# Patient Record
Sex: Female | Born: 1935 | Race: White | Hispanic: No | Marital: Single
Health system: Southern US, Community
[De-identification: ages and names within clinical notes are randomized; demographics above are authoritative.]

## PROBLEM LIST (undated history)

## (undated) DIAGNOSIS — I495 Sick sinus syndrome: Secondary | ICD-10-CM

## (undated) DIAGNOSIS — Z8719 Personal history of other diseases of the digestive system: Secondary | ICD-10-CM

## (undated) DIAGNOSIS — K219 Gastro-esophageal reflux disease without esophagitis: Secondary | ICD-10-CM

## (undated) DIAGNOSIS — I4891 Unspecified atrial fibrillation: Secondary | ICD-10-CM

## (undated) DIAGNOSIS — I214 Non-ST elevation (NSTEMI) myocardial infarction: Secondary | ICD-10-CM

## (undated) DIAGNOSIS — I639 Cerebral infarction, unspecified: Secondary | ICD-10-CM

## (undated) DIAGNOSIS — S065X9A Traumatic subdural hemorrhage with loss of consciousness of unspecified duration, initial encounter: Secondary | ICD-10-CM

## (undated) DIAGNOSIS — I509 Heart failure, unspecified: Secondary | ICD-10-CM

## (undated) DIAGNOSIS — E039 Hypothyroidism, unspecified: Secondary | ICD-10-CM

## (undated) DIAGNOSIS — I480 Paroxysmal atrial fibrillation: Secondary | ICD-10-CM

## (undated) DIAGNOSIS — K922 Gastrointestinal hemorrhage, unspecified: Secondary | ICD-10-CM

## (undated) DIAGNOSIS — I10 Essential (primary) hypertension: Secondary | ICD-10-CM

## (undated) DIAGNOSIS — I2510 Atherosclerotic heart disease of native coronary artery without angina pectoris: Secondary | ICD-10-CM

## (undated) DIAGNOSIS — F0390 Unspecified dementia without behavioral disturbance: Secondary | ICD-10-CM

## (undated) DIAGNOSIS — S065XAA Traumatic subdural hemorrhage with loss of consciousness status unknown, initial encounter: Secondary | ICD-10-CM

## (undated) HISTORY — DX: Non-ST elevation (NSTEMI) myocardial infarction: I21.4

## (undated) HISTORY — DX: Paroxysmal atrial fibrillation: I48.0

## (undated) HISTORY — DX: Personal history of other diseases of the digestive system: Z87.19

## (undated) HISTORY — DX: Hypothyroidism, unspecified: E03.9

## (undated) HISTORY — DX: Traumatic subdural hemorrhage with loss of consciousness of unspecified duration, initial encounter: S06.5X9A

## (undated) HISTORY — DX: Sick sinus syndrome: I49.5

## (undated) HISTORY — DX: Gastro-esophageal reflux disease without esophagitis: K21.9

## (undated) HISTORY — PX: FRACTURE SURGERY: SHX138

## (undated) HISTORY — DX: Traumatic subdural hemorrhage with loss of consciousness status unknown, initial encounter: S06.5XAA

## (undated) HISTORY — PX: INSERT / REPLACE / REMOVE PACEMAKER: SUR710

## (undated) HISTORY — PX: ABDOMINAL HYSTERECTOMY: SHX81

## (undated) HISTORY — PX: JOINT REPLACEMENT: SHX530

---

## 2000-01-18 ENCOUNTER — Encounter: Admission: RE | Admit: 2000-01-18 | Discharge: 2000-01-18 | Payer: Self-pay | Admitting: Orthopedic Surgery

## 2000-01-18 ENCOUNTER — Encounter: Payer: Self-pay | Admitting: Orthopedic Surgery

## 2000-01-23 ENCOUNTER — Encounter: Payer: Self-pay | Admitting: Cardiology

## 2000-01-23 ENCOUNTER — Encounter: Admission: RE | Admit: 2000-01-23 | Discharge: 2000-01-23 | Payer: Self-pay | Admitting: Cardiology

## 2000-05-23 ENCOUNTER — Ambulatory Visit (HOSPITAL_COMMUNITY): Admission: RE | Admit: 2000-05-23 | Discharge: 2000-05-23 | Payer: Self-pay | Admitting: Internal Medicine

## 2000-07-20 ENCOUNTER — Encounter: Payer: Self-pay | Admitting: General Surgery

## 2000-07-20 ENCOUNTER — Encounter: Admission: RE | Admit: 2000-07-20 | Discharge: 2000-07-20 | Payer: Self-pay | Admitting: General Surgery

## 2001-01-02 ENCOUNTER — Encounter: Admission: RE | Admit: 2001-01-02 | Discharge: 2001-04-02 | Payer: Self-pay | Admitting: *Deleted

## 2001-10-07 ENCOUNTER — Encounter: Payer: Self-pay | Admitting: Gastroenterology

## 2001-10-07 ENCOUNTER — Ambulatory Visit (HOSPITAL_COMMUNITY): Admission: RE | Admit: 2001-10-07 | Discharge: 2001-10-07 | Payer: Self-pay | Admitting: Gastroenterology

## 2002-08-25 ENCOUNTER — Other Ambulatory Visit: Admission: RE | Admit: 2002-08-25 | Discharge: 2002-08-25 | Payer: Self-pay | Admitting: Obstetrics and Gynecology

## 2002-11-05 ENCOUNTER — Encounter (HOSPITAL_BASED_OUTPATIENT_CLINIC_OR_DEPARTMENT_OTHER): Admission: RE | Admit: 2002-11-05 | Discharge: 2003-01-28 | Payer: Self-pay | Admitting: Internal Medicine

## 2003-01-01 ENCOUNTER — Encounter (HOSPITAL_BASED_OUTPATIENT_CLINIC_OR_DEPARTMENT_OTHER): Admission: RE | Admit: 2003-01-01 | Discharge: 2003-01-29 | Payer: Self-pay | Admitting: Internal Medicine

## 2003-01-30 ENCOUNTER — Encounter (HOSPITAL_BASED_OUTPATIENT_CLINIC_OR_DEPARTMENT_OTHER): Admission: RE | Admit: 2003-01-30 | Discharge: 2003-04-30 | Payer: Self-pay | Admitting: Internal Medicine

## 2003-03-28 ENCOUNTER — Emergency Department (HOSPITAL_COMMUNITY): Admission: EM | Admit: 2003-03-28 | Discharge: 2003-03-29 | Payer: Self-pay | Admitting: Emergency Medicine

## 2004-03-28 ENCOUNTER — Inpatient Hospital Stay (HOSPITAL_COMMUNITY): Admission: RE | Admit: 2004-03-28 | Discharge: 2004-03-31 | Payer: Self-pay | Admitting: Obstetrics and Gynecology

## 2004-04-08 ENCOUNTER — Inpatient Hospital Stay (HOSPITAL_COMMUNITY): Admission: AD | Admit: 2004-04-08 | Discharge: 2004-04-08 | Payer: Self-pay | Admitting: Obstetrics and Gynecology

## 2004-06-02 ENCOUNTER — Encounter: Admission: RE | Admit: 2004-06-02 | Discharge: 2004-06-02 | Payer: Self-pay | Admitting: Internal Medicine

## 2004-07-13 ENCOUNTER — Inpatient Hospital Stay (HOSPITAL_BASED_OUTPATIENT_CLINIC_OR_DEPARTMENT_OTHER): Admission: RE | Admit: 2004-07-13 | Discharge: 2004-07-13 | Payer: Self-pay | Admitting: Cardiovascular Disease

## 2004-08-07 ENCOUNTER — Emergency Department (HOSPITAL_COMMUNITY): Admission: EM | Admit: 2004-08-07 | Discharge: 2004-08-07 | Payer: Self-pay | Admitting: Family Medicine

## 2005-01-11 ENCOUNTER — Inpatient Hospital Stay (HOSPITAL_COMMUNITY): Admission: AC | Admit: 2005-01-11 | Discharge: 2005-01-20 | Payer: Self-pay

## 2005-01-13 ENCOUNTER — Encounter: Payer: Self-pay | Admitting: Cardiovascular Disease

## 2005-01-20 ENCOUNTER — Ambulatory Visit: Payer: Self-pay | Admitting: Physical Medicine & Rehabilitation

## 2005-01-20 ENCOUNTER — Inpatient Hospital Stay (HOSPITAL_COMMUNITY)
Admission: AD | Admit: 2005-01-20 | Discharge: 2005-02-07 | Payer: Self-pay | Admitting: Physical Medicine & Rehabilitation

## 2005-03-02 ENCOUNTER — Encounter: Admission: RE | Admit: 2005-03-02 | Discharge: 2005-03-02 | Payer: Self-pay | Admitting: Obstetrics and Gynecology

## 2005-03-03 ENCOUNTER — Ambulatory Visit: Admission: RE | Admit: 2005-03-03 | Discharge: 2005-03-03 | Payer: Self-pay | Admitting: Orthopedic Surgery

## 2005-04-13 ENCOUNTER — Encounter: Admission: RE | Admit: 2005-04-13 | Discharge: 2005-04-13 | Payer: Self-pay | Admitting: Internal Medicine

## 2005-05-10 ENCOUNTER — Encounter: Admission: RE | Admit: 2005-05-10 | Discharge: 2005-05-10 | Payer: Self-pay | Admitting: Orthopedic Surgery

## 2005-06-13 ENCOUNTER — Encounter: Admission: RE | Admit: 2005-06-13 | Discharge: 2005-06-13 | Payer: Self-pay | Admitting: Internal Medicine

## 2005-07-12 ENCOUNTER — Encounter: Admission: RE | Admit: 2005-07-12 | Discharge: 2005-07-12 | Payer: Self-pay | Admitting: General Surgery

## 2005-11-28 ENCOUNTER — Encounter: Admission: RE | Admit: 2005-11-28 | Discharge: 2005-11-28 | Payer: Self-pay | Admitting: Internal Medicine

## 2006-01-04 ENCOUNTER — Encounter: Admission: RE | Admit: 2006-01-04 | Discharge: 2006-01-04 | Payer: Self-pay | Admitting: Internal Medicine

## 2006-01-31 ENCOUNTER — Encounter: Admission: RE | Admit: 2006-01-31 | Discharge: 2006-01-31 | Payer: Self-pay | Admitting: Internal Medicine

## 2006-06-11 ENCOUNTER — Inpatient Hospital Stay (HOSPITAL_COMMUNITY): Admission: RE | Admit: 2006-06-11 | Discharge: 2006-06-15 | Payer: Self-pay | Admitting: Orthopedic Surgery

## 2006-08-14 ENCOUNTER — Encounter: Admission: RE | Admit: 2006-08-14 | Discharge: 2006-09-09 | Payer: Self-pay | Admitting: Orthopedic Surgery

## 2006-09-10 ENCOUNTER — Encounter: Admission: RE | Admit: 2006-09-10 | Discharge: 2006-10-03 | Payer: Self-pay | Admitting: Orthopedic Surgery

## 2006-12-30 ENCOUNTER — Inpatient Hospital Stay (HOSPITAL_COMMUNITY): Admission: EM | Admit: 2006-12-30 | Discharge: 2007-01-01 | Payer: Self-pay | Admitting: Emergency Medicine

## 2006-12-30 ENCOUNTER — Ambulatory Visit: Payer: Self-pay | Admitting: Cardiology

## 2007-01-10 ENCOUNTER — Encounter (HOSPITAL_COMMUNITY): Admission: RE | Admit: 2007-01-10 | Discharge: 2007-04-01 | Payer: Self-pay | Admitting: Internal Medicine

## 2007-01-14 ENCOUNTER — Encounter: Admission: RE | Admit: 2007-01-14 | Discharge: 2007-01-14 | Payer: Self-pay | Admitting: Internal Medicine

## 2007-03-06 ENCOUNTER — Ambulatory Visit (HOSPITAL_COMMUNITY): Admission: RE | Admit: 2007-03-06 | Discharge: 2007-03-06 | Payer: Self-pay | Admitting: Internal Medicine

## 2007-04-01 ENCOUNTER — Encounter: Admission: RE | Admit: 2007-04-01 | Discharge: 2007-04-01 | Payer: Self-pay | Admitting: Internal Medicine

## 2007-05-12 ENCOUNTER — Observation Stay (HOSPITAL_COMMUNITY): Admission: EM | Admit: 2007-05-12 | Discharge: 2007-05-15 | Payer: Self-pay | Admitting: Emergency Medicine

## 2007-09-03 ENCOUNTER — Ambulatory Visit (HOSPITAL_COMMUNITY): Admission: RE | Admit: 2007-09-03 | Discharge: 2007-09-03 | Payer: Self-pay | Admitting: Internal Medicine

## 2007-09-16 ENCOUNTER — Encounter: Admission: RE | Admit: 2007-09-16 | Discharge: 2007-09-16 | Payer: Self-pay | Admitting: Internal Medicine

## 2007-10-02 ENCOUNTER — Ambulatory Visit (HOSPITAL_BASED_OUTPATIENT_CLINIC_OR_DEPARTMENT_OTHER): Admission: RE | Admit: 2007-10-02 | Discharge: 2007-10-02 | Payer: Self-pay | Admitting: Orthopedic Surgery

## 2007-12-20 ENCOUNTER — Inpatient Hospital Stay (HOSPITAL_COMMUNITY): Admission: AD | Admit: 2007-12-20 | Discharge: 2007-12-21 | Payer: Self-pay | Admitting: Cardiovascular Disease

## 2008-01-11 ENCOUNTER — Emergency Department (HOSPITAL_COMMUNITY): Admission: EM | Admit: 2008-01-11 | Discharge: 2008-01-11 | Payer: Self-pay | Admitting: Emergency Medicine

## 2008-03-05 ENCOUNTER — Encounter: Admission: RE | Admit: 2008-03-05 | Discharge: 2008-03-05 | Payer: Self-pay | Admitting: Internal Medicine

## 2008-09-29 ENCOUNTER — Ambulatory Visit (HOSPITAL_COMMUNITY): Admission: RE | Admit: 2008-09-29 | Discharge: 2008-09-29 | Payer: Self-pay | Admitting: Internal Medicine

## 2009-03-03 HISTORY — PX: PACEMAKER INSERTION: SHX728

## 2009-03-10 ENCOUNTER — Encounter: Admission: RE | Admit: 2009-03-10 | Discharge: 2009-03-10 | Payer: Self-pay | Admitting: Internal Medicine

## 2009-03-18 ENCOUNTER — Inpatient Hospital Stay (HOSPITAL_COMMUNITY): Admission: AD | Admit: 2009-03-18 | Discharge: 2009-03-19 | Payer: Self-pay | Admitting: *Deleted

## 2009-06-29 ENCOUNTER — Inpatient Hospital Stay (HOSPITAL_COMMUNITY): Admission: RE | Admit: 2009-06-29 | Discharge: 2009-07-16 | Payer: Self-pay | Admitting: Cardiovascular Disease

## 2009-06-30 ENCOUNTER — Ambulatory Visit: Payer: Self-pay | Admitting: Thoracic Surgery (Cardiothoracic Vascular Surgery)

## 2009-07-01 ENCOUNTER — Encounter: Payer: Self-pay | Admitting: Thoracic Surgery (Cardiothoracic Vascular Surgery)

## 2009-07-03 HISTORY — PX: CORONARY ARTERY BYPASS GRAFT: SHX141

## 2009-07-05 ENCOUNTER — Ambulatory Visit: Payer: Self-pay | Admitting: Thoracic Surgery (Cardiothoracic Vascular Surgery)

## 2009-07-06 ENCOUNTER — Encounter (INDEPENDENT_AMBULATORY_CARE_PROVIDER_SITE_OTHER): Payer: Self-pay | Admitting: Anesthesiology

## 2009-07-09 ENCOUNTER — Encounter: Payer: Self-pay | Admitting: Internal Medicine

## 2009-07-09 ENCOUNTER — Ambulatory Visit: Payer: Self-pay | Admitting: Internal Medicine

## 2009-07-22 ENCOUNTER — Ambulatory Visit: Payer: Self-pay | Admitting: Thoracic Surgery (Cardiothoracic Vascular Surgery)

## 2009-08-09 ENCOUNTER — Ambulatory Visit: Payer: Self-pay | Admitting: Thoracic Surgery (Cardiothoracic Vascular Surgery)

## 2009-08-09 ENCOUNTER — Encounter
Admission: RE | Admit: 2009-08-09 | Discharge: 2009-08-09 | Payer: Self-pay | Admitting: Thoracic Surgery (Cardiothoracic Vascular Surgery)

## 2009-08-17 ENCOUNTER — Ambulatory Visit: Payer: Self-pay | Admitting: Vascular Surgery

## 2009-08-17 ENCOUNTER — Inpatient Hospital Stay (HOSPITAL_COMMUNITY): Admission: EM | Admit: 2009-08-17 | Discharge: 2009-08-20 | Payer: Self-pay | Admitting: Emergency Medicine

## 2009-08-17 ENCOUNTER — Encounter (INDEPENDENT_AMBULATORY_CARE_PROVIDER_SITE_OTHER): Payer: Self-pay | Admitting: Emergency Medicine

## 2009-08-17 HISTORY — PX: CARDIAC CATHETERIZATION: SHX172

## 2009-09-07 ENCOUNTER — Ambulatory Visit: Payer: Self-pay | Admitting: Thoracic Surgery (Cardiothoracic Vascular Surgery)

## 2009-09-07 ENCOUNTER — Encounter
Admission: RE | Admit: 2009-09-07 | Discharge: 2009-09-07 | Payer: Self-pay | Admitting: Thoracic Surgery (Cardiothoracic Vascular Surgery)

## 2009-09-10 ENCOUNTER — Inpatient Hospital Stay (HOSPITAL_COMMUNITY): Admission: EM | Admit: 2009-09-10 | Discharge: 2009-09-17 | Payer: Self-pay | Admitting: Emergency Medicine

## 2009-09-14 ENCOUNTER — Ambulatory Visit: Payer: Self-pay | Admitting: Physical Medicine & Rehabilitation

## 2009-09-17 ENCOUNTER — Inpatient Hospital Stay (HOSPITAL_COMMUNITY)
Admission: RE | Admit: 2009-09-17 | Discharge: 2009-10-01 | Payer: Self-pay | Admitting: Physical Medicine & Rehabilitation

## 2009-09-17 ENCOUNTER — Ambulatory Visit: Payer: Self-pay | Admitting: Physical Medicine & Rehabilitation

## 2009-10-15 ENCOUNTER — Encounter: Payer: Self-pay | Admitting: Internal Medicine

## 2009-10-26 ENCOUNTER — Ambulatory Visit (HOSPITAL_COMMUNITY): Admission: RE | Admit: 2009-10-26 | Discharge: 2009-10-26 | Payer: Self-pay | Admitting: Internal Medicine

## 2009-11-08 ENCOUNTER — Encounter
Admission: RE | Admit: 2009-11-08 | Discharge: 2009-11-09 | Payer: Self-pay | Admitting: Physical Medicine & Rehabilitation

## 2009-11-09 ENCOUNTER — Ambulatory Visit: Payer: Self-pay | Admitting: Physical Medicine & Rehabilitation

## 2010-01-12 ENCOUNTER — Encounter: Admission: RE | Admit: 2010-01-12 | Discharge: 2010-03-28 | Payer: Self-pay | Admitting: Internal Medicine

## 2010-01-31 ENCOUNTER — Ambulatory Visit: Payer: Self-pay | Admitting: Thoracic Surgery (Cardiothoracic Vascular Surgery)

## 2010-04-12 ENCOUNTER — Encounter: Payer: Self-pay | Admitting: Internal Medicine

## 2010-04-20 DIAGNOSIS — I10 Essential (primary) hypertension: Secondary | ICD-10-CM | POA: Insufficient documentation

## 2010-04-20 DIAGNOSIS — I4891 Unspecified atrial fibrillation: Secondary | ICD-10-CM | POA: Insufficient documentation

## 2010-04-20 DIAGNOSIS — E039 Hypothyroidism, unspecified: Secondary | ICD-10-CM | POA: Insufficient documentation

## 2010-04-20 DIAGNOSIS — K219 Gastro-esophageal reflux disease without esophagitis: Secondary | ICD-10-CM

## 2010-04-20 DIAGNOSIS — I2510 Atherosclerotic heart disease of native coronary artery without angina pectoris: Secondary | ICD-10-CM | POA: Insufficient documentation

## 2010-04-20 DIAGNOSIS — E785 Hyperlipidemia, unspecified: Secondary | ICD-10-CM | POA: Insufficient documentation

## 2010-04-20 DIAGNOSIS — M810 Age-related osteoporosis without current pathological fracture: Secondary | ICD-10-CM | POA: Insufficient documentation

## 2010-04-22 ENCOUNTER — Ambulatory Visit: Payer: Self-pay | Admitting: Internal Medicine

## 2010-04-22 DIAGNOSIS — I495 Sick sinus syndrome: Secondary | ICD-10-CM

## 2010-04-26 ENCOUNTER — Ambulatory Visit: Payer: Self-pay | Admitting: Cardiovascular Disease

## 2010-05-27 ENCOUNTER — Inpatient Hospital Stay (HOSPITAL_COMMUNITY)
Admission: EM | Admit: 2010-05-27 | Discharge: 2010-05-30 | Payer: Self-pay | Source: Home / Self Care | Admitting: Emergency Medicine

## 2010-07-11 ENCOUNTER — Ambulatory Visit: Admit: 2010-07-11 | Payer: Self-pay | Admitting: Thoracic Surgery (Cardiothoracic Vascular Surgery)

## 2010-07-23 ENCOUNTER — Encounter (INDEPENDENT_AMBULATORY_CARE_PROVIDER_SITE_OTHER): Payer: Self-pay | Admitting: *Deleted

## 2010-07-24 ENCOUNTER — Encounter: Payer: Self-pay | Admitting: Thoracic Surgery (Cardiothoracic Vascular Surgery)

## 2010-07-30 ENCOUNTER — Encounter: Payer: Self-pay | Admitting: Internal Medicine

## 2010-08-01 ENCOUNTER — Ambulatory Visit
Admission: RE | Admit: 2010-08-01 | Discharge: 2010-08-01 | Payer: Self-pay | Source: Home / Self Care | Attending: Internal Medicine | Admitting: Internal Medicine

## 2010-08-02 NOTE — Letter (Signed)
Summary: [facility] Progress Note   [facility] Progress Note   Imported By: Roderic Ovens 05/02/2010 10:21:54  _____________________________________________________________________  External Attachment:    Type:   Image     Comment:   External Document

## 2010-08-02 NOTE — Assessment & Plan Note (Signed)
Summary: pacer check/medtronic   Visit Type:  Initial Consult   Current Medications (verified): 1)  Plavix 75 Mg Tabs (Clopidogrel Bisulfate) .... Take One Tablet By Mouth Daily 2)  Levothroid 125 Mcg Tabs (Levothyroxine Sodium) .... Once Daily 3)  Lexapro 10 Mg Tabs (Escitalopram Oxalate) .... Take One Tablet By Mouth Once Daily. 4)  Pantoprazole Sodium 40 Mg Tbec (Pantoprazole Sodium) .... Once Daily 5)  Oscal 500/200 D-3 500-200 Mg-Unit Tabs (Calcium-Vitamin D) .... Uad 6)  Potassium 99 Mg Tabs (Potassium) .... Once Daily 7)  Vitamin C 500 Mg  Tabs (Ascorbic Acid) .... Once Daily 8)  Multivitamins   Tabs (Multiple Vitamin) .... Once Daily 9)  Biotin 1000 Mcg Tabs (Biotin) .... Once Daily 10)  Aspirin 81 Mg Tbec (Aspirin) .... Take One Tablet By Mouth Daily 11)  Carvedilol 12.5 Mg Tabs (Carvedilol) .... Take 1/2 Tablet By Mouth Twice A Day 12)  Ranexa 500 Mg Xr12h-Tab (Ranolazine) .... Two Times A Day 13)  Amiodarone Hcl 200 Mg Tabs (Amiodarone Hcl) .... Take One Tablet By Mouth M,w,f 14)  Vitamin D (Ergocalciferol) 50000 Unit Caps (Ergocalciferol) .... Weekly 15)  Reclast 5 Mg/174ml Soln (Zoledronic Acid) .... Yearly  Allergies (verified): No Known Drug Allergies  Past History:  Past Medical History: Last updated: 04/20/2010 Current Problems:  ATRIAL FIBRILLATION (ICD-427.31) OSTEOPOROSIS (ICD-733.00) DYSLIPIDEMIA (ICD-272.4) UNSPECIFIED HYPOTHYROIDISM (ICD-244.9) GERD (ICD-530.81) CAD (ICD-414.00) HYPERTENSION (ICD-401.9)    Vital Signs:  Patient profile:   77 year old female Height:      66 inches Weight:      215 pounds BMI:     34.83 Pulse rate:   71 / minute BP sitting:   122 / 70  (left arm)  Vitals Entered By: Laurance Flatten CMA (April 22, 2010 10:17 AM)   PPM Specifications Following MD:  Hillis Range, MD     Referring MD:  Shaune Pascal, MD PPM Vendor:  Medtronic     PPM Model Number:  [id number]     PPM Serial Number:  [id number] PPM DOI:  03/18/2009      PPM Implanting MD:  Charlynn Court  Lead 1    Location: RA     DOI: 03/18/2009     Model #: [id number]     Serial #: [id number]     Status: active Lead 2    Location: RV     DOI: 03/18/2009     Model #: [id number]     Serial #: [id number]     Status: active  Magnet Response Rate:  BOL 85 ERI 65  Indications:  Sick sinus syndrome   PPM Follow Up Remote Check?  No Battery Voltage:  3.00 V     Pacer Dependent:  No       PPM Device Measurements Atrium  Amplitude: 2.0 mV, Impedance: 424 ohms, Threshold: 1.0 V at 0.4 msec Right Ventricle  Amplitude: 10 mV, Impedance: 560 ohms, Threshold: 1.5 V at 0.4 msec  Episodes MS Episodes:  0     Coumadin:  No Ventricular High Rate:  0     Atrial Pacing:  100%     Ventricular Pacing:  0%  Parameters Mode:  DDDR+     Lower Rate Limit:  70     Upper Rate Limit:  120 Paced AV Delay:  180     Sensed AV Delay:  150 Next Remote Date:  07/21/2010     Next Cardiology Appt Due:  04/03/2011 Tech Comments:  No parameter changes.  Device function normal.  Checked by [employer].  [employer]link transmissions every 3 months.  ROV 1 year with Dr. Johney Frame. Altha Harm, LPN  April 22, 2010 10:38 AM   Appended Document: [facility]     Visit Type:  Initial Consult Referring Provider:  Dr Elease Hashimoto Primary Provider:  Sherrye Payor, MD   History of Present Illness: Ms Cutbirth is a pleasant 77 yo WF with a h/o CAD s/p prior PCI and pacemaker implantation for sick sinus syndrome who presents today to be established in the pacemaker clinic.  She previously had her pacemaker implanted by Dr Reyes Ivan 2010.  She reports some improvement in fatigue thereafter.  She had a fall with ICH 3/11 which has led to significant debility.  She has difficulty with expressive and receptive aphasias.  She carries a history of afib but denies symptoms of palpitations.  She is on ASA and plavix for CAD but has not been treated with coumadin recently.  SHe denies chest pain, shortness of breath, orthopnea,  PND, lower extremity edema, dizziness, presyncope, syncope, or neurologic sequela. The patient is tolerating medications without difficulties and is otherwise without complaint today.   Allergies: No Known Drug Allergies  Past History:  Past Medical History: ATRIAL FIBRILLATION (ICD-427.31) SSS s/p PPM implantation 2010 CAD sp PCI OSTEOPOROSIS (ICD-733.00) DYSLIPIDEMIA (ICD-272.4) UNSPECIFIED HYPOTHYROIDISM (ICD-244.9) GERD (ICD-530.81) HYPERTENSION (ICD-401.9)    Past Surgical History: s/p PPM by Dr Reyes Ivan 2010  Family History: CAD  Social History: denies TED  Review of Systems       All systems are reviewed and negative except as listed in the HPI.   Physical Exam  General:  obese, walks with a walker Head:  normocephalic and atraumatic Eyes:  PERRLA/EOM intact; conjunctiva and lids normal. Mouth:  Teeth, gums and palate normal. Oral mucosa normal. Neck:  supple, JVP 8cm Chest Wall:  pacemaker site is well healed Lungs:  Clear bilaterally to auscultation and percussion. Heart:  RRR, no m/r/g Abdomen:  Bowel sounds positive; abdomen soft and non-tender without masses, organomegaly, or hernias noted. No hepatosplenomegaly. Msk:  walks slowly with a walker Pulses:  pulses normal in all 4 extremities Extremities:  No clubbing or cyanosis. traced edema Neurologic:  clearly exhibits difficulty with receptive and expressive aphasia on exam Skin:  Intact without lesions or rashes. Psych:  Normal affect.   PPM Specifications Following MD:  Hillis Range, MD     Referring MD:  Shaune Pascal, MD PPM Vendor:  Medtronic     PPM Model Number:  [id number]     PPM Serial Number:  [id number] PPM DOI:  03/18/2009     PPM Implanting MD:  Charlynn Court  Lead 1    Location: RA     DOI: 03/18/2009     Model #: [id number]     Serial #: [id number]     Status: active Lead 2    Location: RV     DOI: 03/18/2009     Model #: [id number]     Serial #: [id number]     Status: active  Magnet Response  Rate:  BOL 85 ERI 65  Indications:  Sick sinus syndrome   PPM Follow Up Pacer Dependent:  No      Episodes Coumadin:  No  Parameters Mode:  DDDR+     Lower Rate Limit:  70     Upper Rate Limit:  120 Paced AV Delay:  180     Sensed AV Delay:  150 MD Comments:  normal pacemaker function  Impression & Recommendations:  Problem # 1:  BRADYCARDIA (ICD-427.89) normal pacemaker function for sick sinus syndrome  Problem # 2:  ATRIAL FIBRILLATION (ICD-427.31) by pacemaker, she has not had recent afib given prior ICH, she is not an ideal coumadin candidate, particularly as she required plavix for CAD continue low dose amiodarone Dr Elease Hashimoto to follow TFTs and LFTs  Problem # 3:  CAD (ICD-414.00) no symptoms of ishemia continue plavix  Patient Instructions: 1)  TTMs every 3 months 2)  follow-up in pacemaker clinici in 12 months

## 2010-08-02 NOTE — Procedures (Addendum)
Summary: Upper Endoscopy  Patient: [name] Note: All result statuses are Final unless otherwise noted.  Tests: (1) Upper Endoscopy (EGD)   EGD Upper Endoscopy       DONE     [facility]     [address]     [place], [place]  [address]           ENDOSCOPY PROCEDURE REPORT           PATIENT:  [name], [name]  MR#:  [id number]     BIRTHDATE:  1930-04-27, 79 yrs. old  GENDER:  female           ENDOSCOPIST:  Iva Boop, MD, [facility]     Referred by:           C. Cornelius Moras, MD           PROCEDURE DATE:  07/09/2009     PROCEDURE:  EGD for control of bleeding     ASA CLASS:  Class III     INDICATIONS:  hemorrhage of GI tract in setting of Plavix and     aspirin 3 days after CABG           MEDICATIONS:   Fentanyl 25 mcg IV, Versed 2 mg IV     TOPICAL ANESTHETIC:  Cetacaine Spray           DESCRIPTION OF PROCEDURE:   After the risks benefits and     alternatives of the procedure were thoroughly explained, informed     consent was obtained.  The  endoscope was introduced through the     mouth and advanced to the second portion of the duodenum, without     limitations.  The instrument was slowly withdrawn as the mucosa     was fully examined.     <<PROCEDUREIMAGES>>           An ulcer was found in the body of the stomach. 2x73mm ulcer with 2     flat black spots epinephrine injection endoscopic clips were     placed 5 cc surrounding fashion, 2 clips  There were multiple     polyps identified. Small, fleshy diminutive polyps in proximal     stomach.  Multiple erosions were found in the cardia. Seen om     gastric side of GE junction, 2 small, clean-based erosions.  A     hiatal hernia was found. It was 3 cm in size.  Otherwise the     examination was normal.    Retroflexed views revealed Retroflexion     exam demonstrated findings as previously described.    The scope     was then withdrawn from the patient and the procedure completed.           COMPLICATIONS:   None           ENDOSCOPIC IMPRESSION:     1) Ulcer in the body of the stomach - 2 flat black spots, EPI     and clip x 2 therapy     2) Polyps, multiple in proximal stomach - are consistent with     benign fundic gland polyps     3) Erosions, multiple in the cardia (2 - clean-based)     4) 3 cm hiatal hernia     5) Otherwise normal examination     RECOMMENDATIONS:     IV PPI     avoid Plavix     continue ASA  serial Hgb     H. pylori antibody     cannot be sure this was responsible for bleed but flat black     spots are stigmata of bleeding           REPEAT EXAM:  as needed           Iva Boop, MD, [facility]           CC:  Rodrigo Ran, MD     C. Cornelius Moras, MD     Yancey Flemings, MD           n.     eSIGNED:   Iva Boop at 07/09/2009 05:45 PM           Loanne Drilling, [id number]  Note: An exclamation mark (!) indicates a result that was not dispersed into the flowsheet. Document Creation Date: 07/12/2009 10:07 AM _______________________________________________________________________  (1) Order result status: Final Collection or observation date-time: 07/09/2009 16:51 Requested date-time:  Receipt date-time:  Reported date-time:  Referring Physician:   Ordering Physician: Stan Head [phone]) Specimen Source:  Source: Launa Grill Order Number: [id number] Lab site:

## 2010-08-02 NOTE — Miscellaneous (Signed)
Summary: Device preload  Clinical Lists Changes  Observations: Added new observation of PPM INDICATN: Sick sinus syndrome (04/12/2010 9:01) Added new observation of MAGNET RTE: BOL 85 ERI 65 (04/12/2010 9:01) Added new observation of PPMLEADSTAT2: active (04/12/2010 9:01) Added new observation of PPMLEADSER2: [id number] (04/12/2010 9:01) Added new observation of PPMLEADMOD2: 5076  (04/12/2010 9:01) Added new observation of PPMLEADDOI2: 03/18/2009  (04/12/2010 9:01) Added new observation of PPMLEADLOC2: RV  (04/12/2010 9:01) Added new observation of PPMLEADSTAT1: active  (04/12/2010 9:01) Added new observation of PPMLEADSER1: [id number]  (04/12/2010 9:01) Added new observation of PPMLEADMOD1: 5076  (04/12/2010 9:01) Added new observation of PPMLEADDOI1: 03/18/2009  (04/12/2010 9:01) Added new observation of PPMLEADLOC1: RA  (04/12/2010 9:01) Added new observation of PPM IMP MD: Charlynn Court  (04/12/2010 9:01) Added new observation of PPM DOI: 03/18/2009  (04/12/2010 9:01) Added new observation of PPM SERL#: [id number]  (04/12/2010 9:01) Added new observation of PPM MODL#: [id number]  (04/12/2010 9:01) Added new observation of PACEMAKERMFG: Medtronic  (04/12/2010 9:01) Added new observation of PPM REFER MD: Shaune Pascal, MD  (04/12/2010 9:01) Added new observation of PACEMAKER MD: Hillis Range, MD  (04/12/2010 9:01)      PPM Specifications Following MD:  Hillis Range, MD     Referring MD:  Shaune Pascal, MD PPM Vendor:  Medtronic     PPM Model Number:  [id number]     PPM Serial Number:  [id number] PPM DOI:  03/18/2009     PPM Implanting MD:  Charlynn Court  Lead 1    Location: RA     DOI: 03/18/2009     Model #: 2536     Serial #: [id number]     Status: active Lead 2    Location: RV     DOI: 03/18/2009     Model #: 9563     Serial #: [id number]     Status: active  Magnet Response Rate:  BOL 85 ERI 65  Indications:  Sick sinus syndrome

## 2010-08-04 NOTE — Letter (Signed)
Summary: Device-Delinquent Phone Journalist, newspaper, Main Office  [address]   [place], [place] [address]   Phone: [phone]  Fax: [phone]     July 23, 2010 MRN: [id number]   [facility] [address] [place], [place]  [address]   Dear Ms. Lukes,  According to our records, you were scheduled for a device phone transmission on  07-21-2010.     We did not receive any results from this check.  If you transmitted on your scheduled day, please call us to help troubleshoot your system.  If you forgot to send your transmission, please send one upon receipt of this letter.  Thank you,   Architectural [facility]ologist [facility]

## 2010-08-16 ENCOUNTER — Encounter (INDEPENDENT_AMBULATORY_CARE_PROVIDER_SITE_OTHER): Payer: Self-pay | Admitting: *Deleted

## 2010-08-24 NOTE — Cardiovascular Report (Signed)
Summary: Office Visit Remote   Office Visit Remote   Imported By: Roderic Ovens 08/16/2010 14:34:31  _____________________________________________________________________  External Attachment:    Type:   Image     Comment:   External Document

## 2010-08-24 NOTE — Letter (Signed)
Summary: Remote Device Check  [employer], Main Office  [address]   [place], [place] [address]   Phone: [phone]  Fax: [phone]     August 16, 2010 MRN: [id number]   [facility] [address] [place], [place]  [address]   Dear Ms. Helle,   Your remote transmission was recieved and reviewed by your physician.  All diagnostics were within normal limits for you.  __X___Your next transmission is scheduled for:  11-03-2010.  Please transmit at any time this day.  If you have a wireless device your transmission will be sent automatically.   Sincerely,  Vella Kohler

## 2010-09-13 LAB — COMPREHENSIVE METABOLIC PANEL
Albumin: 4 g/dL (ref 3.5–5.2)
Alkaline Phosphatase: 66 U/L (ref 39–117)
Calcium: 9.4 mg/dL (ref 8.4–10.5)
Chloride: 105 mEq/L (ref 96–112)
Creatinine, Ser: 1.22 mg/dL — ABNORMAL HIGH (ref 0.4–1.2)
Glucose, Bld: 98 mg/dL (ref 70–99)

## 2010-09-13 LAB — BASIC METABOLIC PANEL
BUN: 31 mg/dL — ABNORMAL HIGH (ref 6–23)
Chloride: 107 mEq/L (ref 96–112)
Glucose, Bld: 110 mg/dL — ABNORMAL HIGH (ref 70–99)
Sodium: 141 mEq/L (ref 135–145)

## 2010-09-13 LAB — PROTEIN ELECTROPHORESIS, SERUM
Beta 2: 4.7 % (ref 3.2–6.5)
Beta Globulin: 5.9 % (ref 4.7–7.2)
M-Spike, %: NOT DETECTED g/dL
Total Protein ELP: 6.7 g/dL (ref 6.0–8.3)

## 2010-09-13 LAB — CBC
HCT: 34 % — ABNORMAL LOW (ref 36.0–46.0)
MCH: 29.9 pg (ref 26.0–34.0)
RBC: 3.75 MIL/uL — ABNORMAL LOW (ref 3.87–5.11)
RDW: 14.1 % (ref 11.5–15.5)

## 2010-09-13 LAB — UIFE/LIGHT CHAINS/TP QN, 24-HR UR
Alpha 2, Urine: DETECTED — AB
Beta, Urine: DETECTED — AB
Free Kappa Lt Chains,Ur: 5.7 mg/dL — ABNORMAL HIGH (ref 0.04–1.51)
Free Lambda Lt Chains,Ur: 0.57 mg/dL (ref 0.08–1.01)
Gamma Globulin, Urine: NOT DETECTED

## 2010-09-13 LAB — URINALYSIS, ROUTINE W REFLEX MICROSCOPIC
Bilirubin Urine: NEGATIVE
Glucose, UA: NEGATIVE mg/dL
Hgb urine dipstick: NEGATIVE
Protein, ur: NEGATIVE mg/dL
Urobilinogen, UA: 0.2 mg/dL (ref 0.0–1.0)

## 2010-09-13 LAB — URINE MICROSCOPIC-ADD ON

## 2010-09-13 LAB — DIFFERENTIAL
Eosinophils Absolute: 0.1 10*3/uL (ref 0.0–0.7)
Lymphocytes Relative: 11 % — ABNORMAL LOW (ref 12–46)
Monocytes Absolute: 0.5 10*3/uL (ref 0.1–1.0)
Neutro Abs: 8.5 10*3/uL — ABNORMAL HIGH (ref 1.7–7.7)
Neutrophils Relative %: 83 % — ABNORMAL HIGH (ref 43–77)

## 2010-09-13 LAB — APTT: aPTT: <20 seconds — ABNORMAL LOW (ref 24–37)

## 2010-09-13 LAB — SAMPLE TO BLOOD BANK

## 2010-09-13 LAB — PROTIME-INR: INR: 1.04 (ref 0.00–1.49)

## 2010-09-13 LAB — VITAMIN D 25 HYDROXY (VIT D DEFICIENCY, FRACTURES): Vit D, 25-Hydroxy: 49 ng/mL (ref 30–89)

## 2010-09-14 ENCOUNTER — Ambulatory Visit (INDEPENDENT_AMBULATORY_CARE_PROVIDER_SITE_OTHER): Payer: Medicare Other | Admitting: Cardiovascular Disease

## 2010-09-14 DIAGNOSIS — I119 Hypertensive heart disease without heart failure: Secondary | ICD-10-CM

## 2010-09-14 DIAGNOSIS — Z950 Presence of cardiac pacemaker: Secondary | ICD-10-CM

## 2010-09-14 DIAGNOSIS — I5022 Chronic systolic (congestive) heart failure: Secondary | ICD-10-CM

## 2010-09-14 DIAGNOSIS — Z9861 Coronary angioplasty status: Secondary | ICD-10-CM

## 2010-09-18 LAB — CBC
HCT: 20.5 % — ABNORMAL LOW (ref 36.0–46.0)
HCT: 28.7 % — ABNORMAL LOW (ref 36.0–46.0)
HCT: 29 % — ABNORMAL LOW (ref 36.0–46.0)
HCT: 29.1 % — ABNORMAL LOW (ref 36.0–46.0)
HCT: 29.5 % — ABNORMAL LOW (ref 36.0–46.0)
HCT: 29.6 % — ABNORMAL LOW (ref 36.0–46.0)
HCT: 30.2 % — ABNORMAL LOW (ref 36.0–46.0)
HCT: 30.3 % — ABNORMAL LOW (ref 36.0–46.0)
HCT: 30.3 % — ABNORMAL LOW (ref 36.0–46.0)
HCT: 30.5 % — ABNORMAL LOW (ref 36.0–46.0)
HCT: 31 % — ABNORMAL LOW (ref 36.0–46.0)
Hemoglobin: 10.1 g/dL — ABNORMAL LOW (ref 12.0–15.0)
Hemoglobin: 10.1 g/dL — ABNORMAL LOW (ref 12.0–15.0)
Hemoglobin: 10.2 g/dL — ABNORMAL LOW (ref 12.0–15.0)
Hemoglobin: 10.2 g/dL — ABNORMAL LOW (ref 12.0–15.0)
Hemoglobin: 10.4 g/dL — ABNORMAL LOW (ref 12.0–15.0)
Hemoglobin: 9 g/dL — ABNORMAL LOW (ref 12.0–15.0)
Hemoglobin: 9.7 g/dL — ABNORMAL LOW (ref 12.0–15.0)
MCHC: 34 g/dL (ref 30.0–36.0)
MCHC: 34.4 g/dL (ref 30.0–36.0)
MCHC: 34.5 g/dL (ref 30.0–36.0)
MCHC: 34.5 g/dL (ref 30.0–36.0)
MCHC: 34.5 g/dL (ref 30.0–36.0)
MCHC: 34.6 g/dL (ref 30.0–36.0)
MCHC: 34.6 g/dL (ref 30.0–36.0)
MCHC: 34.7 g/dL (ref 30.0–36.0)
MCHC: 34.7 g/dL (ref 30.0–36.0)
MCHC: 34.8 g/dL (ref 30.0–36.0)
MCHC: 34.8 g/dL (ref 30.0–36.0)
MCHC: 34.8 g/dL (ref 30.0–36.0)
MCHC: 35.1 g/dL (ref 30.0–36.0)
MCHC: 35.7 g/dL (ref 30.0–36.0)
MCV: 88.8 fL (ref 78.0–100.0)
MCV: 88.9 fL (ref 78.0–100.0)
MCV: 89 fL (ref 78.0–100.0)
MCV: 89.3 fL (ref 78.0–100.0)
MCV: 89.5 fL (ref 78.0–100.0)
MCV: 89.5 fL (ref 78.0–100.0)
MCV: 89.7 fL (ref 78.0–100.0)
MCV: 89.7 fL (ref 78.0–100.0)
MCV: 90.1 fL (ref 78.0–100.0)
MCV: 90.6 fL (ref 78.0–100.0)
MCV: 90.7 fL (ref 78.0–100.0)
Platelets: 133 10*3/uL — ABNORMAL LOW (ref 150–400)
Platelets: 152 10*3/uL (ref 150–400)
Platelets: 167 10*3/uL (ref 150–400)
Platelets: 171 10*3/uL (ref 150–400)
Platelets: 173 10*3/uL (ref 150–400)
Platelets: 179 10*3/uL (ref 150–400)
Platelets: 195 10*3/uL (ref 150–400)
Platelets: DECREASED 10*3/uL (ref 150–400)
RBC: 2.51 MIL/uL — ABNORMAL LOW (ref 3.87–5.11)
RBC: 2.73 MIL/uL — ABNORMAL LOW (ref 3.87–5.11)
RBC: 2.93 MIL/uL — ABNORMAL LOW (ref 3.87–5.11)
RBC: 3.2 MIL/uL — ABNORMAL LOW (ref 3.87–5.11)
RBC: 3.22 MIL/uL — ABNORMAL LOW (ref 3.87–5.11)
RBC: 3.27 MIL/uL — ABNORMAL LOW (ref 3.87–5.11)
RBC: 3.32 MIL/uL — ABNORMAL LOW (ref 3.87–5.11)
RDW: 13.8 % (ref 11.5–15.5)
RDW: 13.8 % (ref 11.5–15.5)
RDW: 14 % (ref 11.5–15.5)
RDW: 14.3 % (ref 11.5–15.5)
RDW: 14.6 % (ref 11.5–15.5)
RDW: 14.7 % (ref 11.5–15.5)
RDW: 14.9 % (ref 11.5–15.5)
RDW: 15 % (ref 11.5–15.5)
RDW: 15.1 % (ref 11.5–15.5)
RDW: 15.2 % (ref 11.5–15.5)
RDW: 15.3 % (ref 11.5–15.5)
RDW: 15.3 % (ref 11.5–15.5)
RDW: 15.4 % (ref 11.5–15.5)
WBC: 10.2 10*3/uL (ref 4.0–10.5)
WBC: 10.5 10*3/uL (ref 4.0–10.5)
WBC: 10.6 10*3/uL — ABNORMAL HIGH (ref 4.0–10.5)
WBC: 11 10*3/uL — ABNORMAL HIGH (ref 4.0–10.5)
WBC: 12.2 10*3/uL — ABNORMAL HIGH (ref 4.0–10.5)
WBC: 6.1 10*3/uL (ref 4.0–10.5)
WBC: 6.6 10*3/uL (ref 4.0–10.5)

## 2010-09-18 LAB — GLUCOSE, CAPILLARY
Glucose-Capillary: 100 mg/dL — ABNORMAL HIGH (ref 70–99)
Glucose-Capillary: 100 mg/dL — ABNORMAL HIGH (ref 70–99)
Glucose-Capillary: 115 mg/dL — ABNORMAL HIGH (ref 70–99)
Glucose-Capillary: 125 mg/dL — ABNORMAL HIGH (ref 70–99)
Glucose-Capillary: 128 mg/dL — ABNORMAL HIGH (ref 70–99)
Glucose-Capillary: 137 mg/dL — ABNORMAL HIGH (ref 70–99)
Glucose-Capillary: 152 mg/dL — ABNORMAL HIGH (ref 70–99)
Glucose-Capillary: 155 mg/dL — ABNORMAL HIGH (ref 70–99)
Glucose-Capillary: 81 mg/dL (ref 70–99)

## 2010-09-18 LAB — BASIC METABOLIC PANEL
BUN: 21 mg/dL (ref 6–23)
BUN: 23 mg/dL (ref 6–23)
BUN: 26 mg/dL — ABNORMAL HIGH (ref 6–23)
BUN: 31 mg/dL — ABNORMAL HIGH (ref 6–23)
BUN: 34 mg/dL — ABNORMAL HIGH (ref 6–23)
BUN: 48 mg/dL — ABNORMAL HIGH (ref 6–23)
CO2: 24 mEq/L (ref 19–32)
CO2: 25 mEq/L (ref 19–32)
CO2: 26 mEq/L (ref 19–32)
CO2: 26 mEq/L (ref 19–32)
CO2: 27 mEq/L (ref 19–32)
CO2: 27 mEq/L (ref 19–32)
CO2: 28 mEq/L (ref 19–32)
CO2: 29 mEq/L (ref 19–32)
Calcium: 7.6 mg/dL — ABNORMAL LOW (ref 8.4–10.5)
Calcium: 7.9 mg/dL — ABNORMAL LOW (ref 8.4–10.5)
Calcium: 7.9 mg/dL — ABNORMAL LOW (ref 8.4–10.5)
Calcium: 8 mg/dL — ABNORMAL LOW (ref 8.4–10.5)
Calcium: 9 mg/dL (ref 8.4–10.5)
Chloride: 100 mEq/L (ref 96–112)
Chloride: 101 mEq/L (ref 96–112)
Chloride: 103 mEq/L (ref 96–112)
Chloride: 104 mEq/L (ref 96–112)
Chloride: 104 mEq/L (ref 96–112)
Chloride: 106 mEq/L (ref 96–112)
Chloride: 108 mEq/L (ref 96–112)
Chloride: 97 mEq/L (ref 96–112)
Creatinine, Ser: 1.31 mg/dL — ABNORMAL HIGH (ref 0.4–1.2)
Creatinine, Ser: 1.36 mg/dL — ABNORMAL HIGH (ref 0.4–1.2)
Creatinine, Ser: 1.7 mg/dL — ABNORMAL HIGH (ref 0.4–1.2)
Creatinine, Ser: 1.79 mg/dL — ABNORMAL HIGH (ref 0.4–1.2)
Creatinine, Ser: 1.85 mg/dL — ABNORMAL HIGH (ref 0.4–1.2)
Creatinine, Ser: 2.08 mg/dL — ABNORMAL HIGH (ref 0.4–1.2)
GFR calc Af Amer: 28 mL/min — ABNORMAL LOW (ref >60)
GFR calc Af Amer: 32 mL/min — ABNORMAL LOW (ref >60)
GFR calc Af Amer: 35 mL/min — ABNORMAL LOW (ref >60)
GFR calc Af Amer: 46 mL/min — ABNORMAL LOW (ref >60)
GFR calc Af Amer: 48 mL/min — ABNORMAL LOW (ref >60)
GFR calc non Af Amer: 38 mL/min — ABNORMAL LOW (ref >60)
GFR calc non Af Amer: 40 mL/min — ABNORMAL LOW (ref >60)
GFR calc non Af Amer: 46 mL/min — ABNORMAL LOW (ref >60)
Glucose, Bld: 109 mg/dL — ABNORMAL HIGH (ref 70–99)
Glucose, Bld: 111 mg/dL — ABNORMAL HIGH (ref 70–99)
Glucose, Bld: 116 mg/dL — ABNORMAL HIGH (ref 70–99)
Glucose, Bld: 145 mg/dL — ABNORMAL HIGH (ref 70–99)
Glucose, Bld: 147 mg/dL — ABNORMAL HIGH (ref 70–99)
Glucose, Bld: 259 mg/dL — ABNORMAL HIGH (ref 70–99)
Glucose, Bld: 99 mg/dL (ref 70–99)
Potassium: 3.7 mEq/L (ref 3.5–5.1)
Potassium: 3.8 mEq/L (ref 3.5–5.1)
Potassium: 3.8 mEq/L (ref 3.5–5.1)
Potassium: 3.9 mEq/L (ref 3.5–5.1)
Potassium: 3.9 mEq/L (ref 3.5–5.1)
Potassium: 4.2 mEq/L (ref 3.5–5.1)
Potassium: 4.2 mEq/L (ref 3.5–5.1)
Potassium: 4.6 mEq/L (ref 3.5–5.1)
Sodium: 134 mEq/L — ABNORMAL LOW (ref 135–145)
Sodium: 139 mEq/L (ref 135–145)
Sodium: 139 mEq/L (ref 135–145)
Sodium: 143 mEq/L (ref 135–145)

## 2010-09-18 LAB — POCT I-STAT 4, (NA,K, GLUC, HGB,HCT)
Glucose, Bld: 101 mg/dL — ABNORMAL HIGH (ref 70–99)
Glucose, Bld: 105 mg/dL — ABNORMAL HIGH (ref 70–99)
Glucose, Bld: 123 mg/dL — ABNORMAL HIGH (ref 70–99)
Glucose, Bld: 144 mg/dL — ABNORMAL HIGH (ref 70–99)
Glucose, Bld: 93 mg/dL (ref 70–99)
Glucose, Bld: 96 mg/dL (ref 70–99)
HCT: 20 % — ABNORMAL LOW (ref 36.0–46.0)
HCT: 21 % — ABNORMAL LOW (ref 36.0–46.0)
HCT: 25 % — ABNORMAL LOW (ref 36.0–46.0)
HCT: 26 % — ABNORMAL LOW (ref 36.0–46.0)
Hemoglobin: 6.8 g/dL — CL (ref 12.0–15.0)
Hemoglobin: 7.1 g/dL — ABNORMAL LOW (ref 12.0–15.0)
Hemoglobin: 7.8 g/dL — ABNORMAL LOW (ref 12.0–15.0)
Hemoglobin: 8.5 g/dL — ABNORMAL LOW (ref 12.0–15.0)
Hemoglobin: 8.8 g/dL — ABNORMAL LOW (ref 12.0–15.0)
Hemoglobin: 8.8 g/dL — ABNORMAL LOW (ref 12.0–15.0)
Potassium: 4.6 mEq/L (ref 3.5–5.1)
Potassium: 5.1 mEq/L (ref 3.5–5.1)
Potassium: 5.9 mEq/L — ABNORMAL HIGH (ref 3.5–5.1)
Potassium: 5.9 mEq/L — ABNORMAL HIGH (ref 3.5–5.1)
Sodium: 132 mEq/L — ABNORMAL LOW (ref 135–145)
Sodium: 134 mEq/L — ABNORMAL LOW (ref 135–145)
Sodium: 138 mEq/L (ref 135–145)
Sodium: 139 mEq/L (ref 135–145)

## 2010-09-18 LAB — PREPARE FRESH FROZEN PLASMA

## 2010-09-18 LAB — TYPE AND SCREEN: Antibody Screen: NEGATIVE

## 2010-09-18 LAB — GRAM STAIN

## 2010-09-18 LAB — POCT I-STAT 3, ART BLOOD GAS (G3+)
Acid-base deficit: 3 mmol/L — ABNORMAL HIGH (ref 0.0–2.0)
Acid-base deficit: 3 mmol/L — ABNORMAL HIGH (ref 0.0–2.0)
Acid-base deficit: 4 mmol/L — ABNORMAL HIGH (ref 0.0–2.0)
Bicarbonate: 21.1 mEq/L (ref 20.0–24.0)
Bicarbonate: 21.4 mEq/L (ref 20.0–24.0)
Bicarbonate: 22.4 mEq/L (ref 20.0–24.0)
O2 Saturation: 99 %
Patient temperature: 35.6
TCO2: 22 mmol/L (ref 0–100)
TCO2: 24 mmol/L (ref 0–100)
pCO2 arterial: 37.6 mmHg (ref 35.0–45.0)
pH, Arterial: 7.353 (ref 7.350–7.400)
pO2, Arterial: 143 mmHg — ABNORMAL HIGH (ref 80.0–100.0)
pO2, Arterial: 330 mmHg — ABNORMAL HIGH (ref 80.0–100.0)
pO2, Arterial: 81 mmHg (ref 80.0–100.0)

## 2010-09-18 LAB — POCT I-STAT, CHEM 8
BUN: 26 mg/dL — ABNORMAL HIGH (ref 6–23)
Creatinine, Ser: 1.2 mg/dL (ref 0.4–1.2)
Potassium: 4.6 mEq/L (ref 3.5–5.1)
Sodium: 140 mEq/L (ref 135–145)

## 2010-09-18 LAB — APTT: aPTT: 34 seconds (ref 24–37)

## 2010-09-18 LAB — POTASSIUM: Potassium: 4.2 mEq/L (ref 3.5–5.1)

## 2010-09-18 LAB — WOUND CULTURE

## 2010-09-18 LAB — PREPARE PLATELETS

## 2010-09-18 LAB — ANAEROBIC CULTURE

## 2010-09-18 LAB — MAGNESIUM: Magnesium: 2.7 mg/dL — ABNORMAL HIGH (ref 1.5–2.5)

## 2010-09-18 LAB — H. PYLORI ANTIBODY, IGG: H Pylori IgG: 0.5 {ISR}

## 2010-09-18 LAB — PROTIME-INR: INR: 1.33 (ref 0.00–1.49)

## 2010-09-18 LAB — MRSA PCR SCREENING: MRSA by PCR: POSITIVE — AB

## 2010-09-21 LAB — POCT CARDIAC MARKERS
CKMB, poc: 2.7 ng/mL (ref 1.0–8.0)
Troponin i, poc: 0.13 ng/mL — ABNORMAL HIGH (ref 0.00–0.09)

## 2010-09-21 LAB — CBC
HCT: 36.5 % (ref 36.0–46.0)
Hemoglobin: 12.2 g/dL (ref 12.0–15.0)
Hemoglobin: 12.3 g/dL (ref 12.0–15.0)
MCHC: 34.1 g/dL (ref 30.0–36.0)
MCHC: 34.3 g/dL (ref 30.0–36.0)
MCV: 91.4 fL (ref 78.0–100.0)
Platelets: 245 10*3/uL (ref 150–400)
RBC: 3.91 MIL/uL (ref 3.87–5.11)
RBC: 4.03 MIL/uL (ref 3.87–5.11)
RBC: 4.14 MIL/uL (ref 3.87–5.11)
RDW: 15 % (ref 11.5–15.5)
WBC: 6.8 10*3/uL (ref 4.0–10.5)
WBC: 8.2 10*3/uL (ref 4.0–10.5)
WBC: 8.6 10*3/uL (ref 4.0–10.5)

## 2010-09-21 LAB — CARDIAC PANEL(CRET KIN+CKTOT+MB+TROPI)
CK, MB: 4.9 ng/mL — ABNORMAL HIGH (ref 0.3–4.0)
Relative Index: 4.5 — ABNORMAL HIGH (ref 0.0–2.5)
Total CK: 102 U/L (ref 7–177)
Total CK: 104 U/L (ref 7–177)
Total CK: 125 U/L (ref 7–177)
Troponin I: 1.07 ng/mL (ref 0.00–0.06)

## 2010-09-21 LAB — BASIC METABOLIC PANEL
BUN: 32 mg/dL — ABNORMAL HIGH (ref 6–23)
CO2: 25 mEq/L (ref 19–32)
CO2: 25 mEq/L (ref 19–32)
Calcium: 8.9 mg/dL (ref 8.4–10.5)
Calcium: 9.2 mg/dL (ref 8.4–10.5)
Chloride: 103 mEq/L (ref 96–112)
Chloride: 104 mEq/L (ref 96–112)
Chloride: 105 mEq/L (ref 96–112)
Creatinine, Ser: 1.34 mg/dL — ABNORMAL HIGH (ref 0.4–1.2)
GFR calc Af Amer: 38 mL/min — ABNORMAL LOW (ref >60)
GFR calc Af Amer: 46 mL/min — ABNORMAL LOW (ref >60)
GFR calc Af Amer: 52 mL/min — ABNORMAL LOW (ref >60)
GFR calc non Af Amer: 38 mL/min — ABNORMAL LOW (ref >60)
Glucose, Bld: 132 mg/dL — ABNORMAL HIGH (ref 70–99)
Potassium: 4.4 mEq/L (ref 3.5–5.1)
Sodium: 137 mEq/L (ref 135–145)
Sodium: 138 mEq/L (ref 135–145)
Sodium: 140 mEq/L (ref 135–145)

## 2010-09-21 LAB — PROTIME-INR: INR: 1.12 (ref 0.00–1.49)

## 2010-09-21 LAB — CK TOTAL AND CKMB (NOT AT ARMC)
CK, MB: 8.6 ng/mL (ref 0.3–4.0)
Relative Index: 6.4 — ABNORMAL HIGH (ref 0.0–2.5)

## 2010-09-21 LAB — APTT: aPTT: 26 seconds (ref 24–37)

## 2010-09-21 LAB — TROPONIN I: Troponin I: 1.94 ng/mL (ref 0.00–0.06)

## 2010-09-23 LAB — PREPARE PLATELETS

## 2010-09-23 LAB — BASIC METABOLIC PANEL WITH GFR
BUN: 19 mg/dL (ref 6–23)
CO2: 26 meq/L (ref 19–32)
Calcium: 8.4 mg/dL (ref 8.4–10.5)
Chloride: 105 meq/L (ref 96–112)
Creatinine, Ser: 1.01 mg/dL (ref 0.4–1.2)
GFR calc non Af Amer: 53 mL/min — ABNORMAL LOW
Glucose, Bld: 113 mg/dL — ABNORMAL HIGH (ref 70–99)
Potassium: 3.6 meq/L (ref 3.5–5.1)
Sodium: 136 meq/L (ref 135–145)

## 2010-09-23 LAB — CBC
HCT: 23.4 % — ABNORMAL LOW (ref 36.0–46.0)
HCT: 26.1 % — ABNORMAL LOW (ref 36.0–46.0)
HCT: 33 % — ABNORMAL LOW (ref 36.0–46.0)
Hemoglobin: 11.4 g/dL — ABNORMAL LOW (ref 12.0–15.0)
Hemoglobin: 8.2 g/dL — ABNORMAL LOW (ref 12.0–15.0)
Hemoglobin: 9.1 g/dL — ABNORMAL LOW (ref 12.0–15.0)
MCHC: 34.7 g/dL (ref 30.0–36.0)
MCHC: 34.7 g/dL (ref 30.0–36.0)
MCV: 90.9 fL (ref 78.0–100.0)
Platelets: 166 K/uL (ref 150–400)
Platelets: 168 10*3/uL (ref 150–400)
Platelets: 193 10*3/uL (ref 150–400)
RBC: 2.61 MIL/uL — ABNORMAL LOW (ref 3.87–5.11)
RBC: 2.91 MIL/uL — ABNORMAL LOW (ref 3.87–5.11)
RBC: 3.63 MIL/uL — ABNORMAL LOW (ref 3.87–5.11)
RDW: 14.7 % (ref 11.5–15.5)
RDW: 14.8 % (ref 11.5–15.5)
RDW: 14.8 % (ref 11.5–15.5)
WBC: 7.5 K/uL (ref 4.0–10.5)
WBC: 9.1 10*3/uL (ref 4.0–10.5)

## 2010-09-23 LAB — BASIC METABOLIC PANEL
BUN: 11 mg/dL (ref 6–23)
CO2: 24 mEq/L (ref 19–32)
CO2: 26 mEq/L (ref 19–32)
Calcium: 9.2 mg/dL (ref 8.4–10.5)
Chloride: 103 mEq/L (ref 96–112)
Chloride: 106 mEq/L (ref 96–112)
Creatinine, Ser: 0.9 mg/dL (ref 0.4–1.2)
GFR calc Af Amer: >60 mL/min (ref >60)
GFR calc Af Amer: >60 mL/min (ref >60)
GFR calc non Af Amer: >60 mL/min (ref >60)
Glucose, Bld: 95 mg/dL (ref 70–99)
Potassium: 3.5 mEq/L (ref 3.5–5.1)
Potassium: 3.7 mEq/L (ref 3.5–5.1)
Potassium: 4.2 mEq/L (ref 3.5–5.1)
Sodium: 136 mEq/L (ref 135–145)
Sodium: 137 mEq/L (ref 135–145)

## 2010-09-23 LAB — GLUCOSE, CAPILLARY: Glucose-Capillary: 150 mg/dL — ABNORMAL HIGH (ref 70–99)

## 2010-09-23 LAB — DIFFERENTIAL
Basophils Absolute: 0.1 K/uL (ref 0.0–0.1)
Basophils Relative: 1 % (ref 0–1)
Eosinophils Absolute: 0.1 K/uL (ref 0.0–0.7)
Eosinophils Relative: 2 % (ref 0–5)
Lymphocytes Relative: 13 % (ref 12–46)
Lymphs Abs: 1 K/uL (ref 0.7–4.0)
Monocytes Absolute: 0.6 K/uL (ref 0.1–1.0)
Monocytes Relative: 8 % (ref 3–12)
Neutro Abs: 5.8 K/uL (ref 1.7–7.7)
Neutrophils Relative %: 77 % (ref 43–77)

## 2010-09-23 LAB — MRSA PCR SCREENING: MRSA by PCR: POSITIVE — AB

## 2010-09-23 LAB — ABO/RH: ABO/RH(D): A POS

## 2010-09-26 LAB — COMPREHENSIVE METABOLIC PANEL
Alkaline Phosphatase: 86 U/L (ref 39–117)
BUN: 21 mg/dL (ref 6–23)
CO2: 26 mEq/L (ref 19–32)
Chloride: 98 mEq/L (ref 96–112)
GFR calc non Af Amer: 41 mL/min — ABNORMAL LOW (ref >60)
Glucose, Bld: 105 mg/dL — ABNORMAL HIGH (ref 70–99)
Potassium: 3.7 mEq/L (ref 3.5–5.1)
Total Bilirubin: 1 mg/dL (ref 0.3–1.2)

## 2010-09-26 LAB — URINALYSIS, ROUTINE W REFLEX MICROSCOPIC
Bilirubin Urine: NEGATIVE
Nitrite: POSITIVE — AB
Specific Gravity, Urine: 1.016 (ref 1.005–1.030)
pH: 7.5 (ref 5.0–8.0)

## 2010-09-26 LAB — DIFFERENTIAL
Basophils Absolute: 0 10*3/uL (ref 0.0–0.1)
Basophils Relative: 1 % (ref 0–1)
Neutro Abs: 5.4 10*3/uL (ref 1.7–7.7)
Neutrophils Relative %: 69 % (ref 43–77)

## 2010-09-26 LAB — URINE CULTURE
Colony Count: >=100000
Special Requests: POSITIVE

## 2010-09-26 LAB — BASIC METABOLIC PANEL
BUN: 20 mg/dL (ref 6–23)
Chloride: 106 mEq/L (ref 96–112)
Creatinine, Ser: 1.37 mg/dL — ABNORMAL HIGH (ref 0.4–1.2)
GFR calc Af Amer: 45 mL/min — ABNORMAL LOW (ref >60)
GFR calc non Af Amer: 37 mL/min — ABNORMAL LOW (ref >60)
GFR calc non Af Amer: 38 mL/min — ABNORMAL LOW (ref >60)
Potassium: 3.6 mEq/L (ref 3.5–5.1)
Potassium: 3.9 mEq/L (ref 3.5–5.1)
Sodium: 139 mEq/L (ref 135–145)

## 2010-09-26 LAB — URINALYSIS, MICROSCOPIC ONLY
Bilirubin Urine: NEGATIVE
Hgb urine dipstick: NEGATIVE
Ketones, ur: NEGATIVE mg/dL
Protein, ur: NEGATIVE mg/dL
Urobilinogen, UA: 0.2 mg/dL (ref 0.0–1.0)

## 2010-09-26 LAB — CBC
HCT: 34.7 % — ABNORMAL LOW (ref 36.0–46.0)
Hemoglobin: 11.8 g/dL — ABNORMAL LOW (ref 12.0–15.0)
MCV: 88.8 fL (ref 78.0–100.0)
RBC: 3.66 MIL/uL — ABNORMAL LOW (ref 3.87–5.11)
RDW: 15.2 % (ref 11.5–15.5)
WBC: 7.9 10*3/uL (ref 4.0–10.5)
WBC: 8.1 10*3/uL (ref 4.0–10.5)

## 2010-09-26 LAB — URINE MICROSCOPIC-ADD ON

## 2010-10-03 LAB — HEPARIN LEVEL (UNFRACTIONATED): Heparin Unfractionated: 0.44 IU/mL (ref 0.30–0.70)

## 2010-10-03 LAB — CBC
HCT: 29 % — ABNORMAL LOW (ref 36.0–46.0)
HCT: 32.2 % — ABNORMAL LOW (ref 36.0–46.0)
Hemoglobin: 10.2 g/dL — ABNORMAL LOW (ref 12.0–15.0)
Hemoglobin: 10.2 g/dL — ABNORMAL LOW (ref 12.0–15.0)
Hemoglobin: 11.2 g/dL — ABNORMAL LOW (ref 12.0–15.0)
MCHC: 35.1 g/dL (ref 30.0–36.0)
MCV: 88.5 fL (ref 78.0–100.0)
MCV: 89.1 fL (ref 78.0–100.0)
RBC: 3.26 MIL/uL — ABNORMAL LOW (ref 3.87–5.11)
RBC: 3.28 MIL/uL — ABNORMAL LOW (ref 3.87–5.11)
WBC: 6.2 10*3/uL (ref 4.0–10.5)
WBC: 6.5 10*3/uL (ref 4.0–10.5)
WBC: 6.5 10*3/uL (ref 4.0–10.5)

## 2010-10-03 LAB — TYPE AND SCREEN: Antibody Screen: NEGATIVE

## 2010-10-03 LAB — COMPREHENSIVE METABOLIC PANEL
ALT: 15 U/L (ref 0–35)
AST: 21 U/L (ref 0–37)
Alkaline Phosphatase: 56 U/L (ref 39–117)
CO2: 26 mEq/L (ref 19–32)
Calcium: 8.7 mg/dL (ref 8.4–10.5)
GFR calc Af Amer: 48 mL/min — ABNORMAL LOW (ref >60)
GFR calc non Af Amer: 40 mL/min — ABNORMAL LOW (ref >60)
Glucose, Bld: 104 mg/dL — ABNORMAL HIGH (ref 70–99)
Potassium: 3.8 mEq/L (ref 3.5–5.1)
Sodium: 139 mEq/L (ref 135–145)

## 2010-10-03 LAB — BLOOD GAS, ARTERIAL
Acid-Base Excess: 0.5 mmol/L (ref 0.0–2.0)
Bicarbonate: 24.6 mEq/L — ABNORMAL HIGH (ref 20.0–24.0)
TCO2: 25.8 mmol/L (ref 0–100)
pCO2 arterial: 39.9 mmHg (ref 35.0–45.0)
pH, Arterial: 7.407 — ABNORMAL HIGH (ref 7.350–7.400)

## 2010-10-03 LAB — ABO/RH: ABO/RH(D): A POS

## 2010-10-07 LAB — CBC
HCT: 32.9 % — ABNORMAL LOW (ref 36.0–46.0)
MCHC: 34.2 g/dL (ref 30.0–36.0)
MCV: 88.3 fL (ref 78.0–100.0)
Platelets: 172 10*3/uL (ref 150–400)
RDW: 13.4 % (ref 11.5–15.5)
WBC: 6.6 10*3/uL (ref 4.0–10.5)

## 2010-10-07 LAB — BASIC METABOLIC PANEL
BUN: 45 mg/dL — ABNORMAL HIGH (ref 6–23)
Chloride: 102 mEq/L (ref 96–112)
Creatinine, Ser: 1.63 mg/dL — ABNORMAL HIGH (ref 0.4–1.2)
Glucose, Bld: 106 mg/dL — ABNORMAL HIGH (ref 70–99)

## 2010-10-07 LAB — PROTIME-INR: Prothrombin Time: 13.5 seconds (ref 11.6–15.2)

## 2010-11-03 ENCOUNTER — Other Ambulatory Visit: Payer: Self-pay | Admitting: Internal Medicine

## 2010-11-03 ENCOUNTER — Ambulatory Visit (INDEPENDENT_AMBULATORY_CARE_PROVIDER_SITE_OTHER): Payer: Medicare Other | Admitting: *Deleted

## 2010-11-03 DIAGNOSIS — I4891 Unspecified atrial fibrillation: Secondary | ICD-10-CM

## 2010-11-09 ENCOUNTER — Encounter: Payer: Self-pay | Admitting: *Deleted

## 2010-11-09 ENCOUNTER — Other Ambulatory Visit: Payer: Self-pay | Admitting: Cardiology

## 2010-11-10 ENCOUNTER — Other Ambulatory Visit: Payer: Self-pay | Admitting: *Deleted

## 2010-11-10 MED ORDER — AMIODARONE HCL 200 MG PO TABS: ORAL_TABLET | ORAL | Status: DC

## 2010-11-10 NOTE — Progress Notes (Signed)
Pacer remote check  

## 2010-11-10 NOTE — Telephone Encounter (Signed)
Fax received from pharmacy. Refill completed. Jodette Aadil Sur RN  

## 2010-11-15 NOTE — Cardiovascular Report (Signed)
[name], [name] NO.:  [id number]   MEDICAL RECORD NO.:  [id number]          PATIENT TYPE:  INP   LOCATION:  2807                         FACILITY:  [facility]   PHYSICIAN:  Vesta Mixer, M.D. DATE OF BIRTH:  [birth year]   DATE OF PROCEDURE:  12/31/2006  DATE OF DISCHARGE:                            CARDIAC CATHETERIZATION   HISTORY OF PRESENT ILLNESS:  [name] is a 77 year old female with a  history of coronary artery disease.  She was seen in the office last  week with some vague chest pains.  Over the weekend she started having  worsening episodes of chest pain and chest pressure.  She was admitted  for further evaluation.  She ruled out for myocardial infarction but was  scheduled for heart catheterization for further evaluation.   The procedure was left heart catheterization with coronary angiography  with PTCA and stenting of her left anterior descending artery.   The right femoral artery was easily cannulated using modified Seldinger  technique.   HEMODYNAMIC RESULTS:  LV pressure was 164/30 with an aortic pressure of  161/56.   ANGIOGRAPHY:  Left main:  Left main has minor luminal irregularities.   Left anterior descending artery has some minor luminal irregularities in  the proximal segment.  The first septal branch has a proximal 70%  stenosis.  This is a rather large septal branch.   The mid LAD has an 80% eccentric stenosis.  The distal LAD is diffusely  diseased between 30-40% and has a 50% stenosis distally.  The apical LAD  has an 80% stenosis, but the vessel is very tiny at this point.   The ramus intermediate branch is a tiny branch.  It is subtotally  occluded.  This branch is certainly way too small to consider  angioplasty on this point.   The left circumflex artery is a small to moderate-sized vessel.  There  are no significant irregularities seen.  There is a posterolateral  branch that is seen filling from collaterals from  the LAD.  It is  assumed that this posterolateral branch originates from the circumflex  artery, although I cannot see the origin.   The right coronary artery is large and is fairly normal.  The posterior  descending artery and posterolateral branches are normal.   The left ventriculogram was performed in a 30 RAO position.  Reveals  normal left ventricular systolic function.  Ejection fraction is around  65%.   PTCA/stenting:  The left main was engaged using a 6-French Judkins left  four guide.  AngioMax was given, but with an ACT of 325, 600mg  of Plavix  were given.  A BMW guidewire was placed down into the LAD.   A 3.0 x 16-mm TAXUS was positioned across the stenosis.  It was deployed  at 11 atmospheres for 47 seconds.  It was then reinflated up to 12  atmospheres for 24 seconds.  It was then pulled back proximally slightly  and inflated up to 14 atmospheres for 15 seconds.   At this point, this balloon was removed and a 3.25 x 15-mm Quantum  monorail  was positioned directly across the stent.  It was inflated up  to 16 atmospheres for 20 seconds.  It was then positioned in the distal  aspect of the stent and was inflated up to 12 atmospheres for 15  seconds.  It was then pulled back to the proximal edge of the stent and  was inflated up to 16 atmospheres for 17 seconds.  Follow-up angiography  revealed a nicely opposed stent.  There appeared to be a slight amount  of residual stenosis in the mid region of the stent which is the site of  the original stenosis.  The Quantum monorail was once again positioned  in the middle of the stent and was inflated up to 18 atmospheres for 17  seconds.  This resulted in a very nice angiographic result.   There were no complications.    1. Successful PTCA and stenting of the mid-LAD.  2. She has diffuse irregularities involving the left circumflex system      and the left anterior descending artery.  The right coronary artery      is largely  uninvolved.  We will continue with aggressive      cholesterol management.  She is stable leaving the lab.  We      anticipate discharge tomorrow.           ______________________________  Vesta Mixer, M.D.     PJN/MEDQ  D:  12/31/2006  T:  12/31/2006  Job:  [id number]

## 2010-11-15 NOTE — Cardiovascular Report (Signed)
NAMEMARVIN, [name] NO.:  [id number]   MEDICAL RECORD NO.:  [id number]          PATIENT TYPE:  INP   LOCATION:  6525                         FACILITY:  [facility]   PHYSICIAN:  Vesta Mixer, M.D.      DATE OF BIRTH:   DATE OF PROCEDURE:  12/20/2007  DATE OF DISCHARGE:                            CARDIAC CATHETERIZATION   [name] is an elderly female with a history of coronary artery  disease.  She is status post PTCA and stenting of her LAD last year.  She presents with some episodes of chest pain.  A stress Cardiolite  study revealed anteroapical ischemia.  She is referred for heart  catheterization for further evaluation.   The procedure was left heart catheterization with PCI of her  proximal/mid LAD and her distal LAD.   The right femoral artery was easily cannulated using a modified  Seldinger technique.   HEMODYNAMICS:  LV pressure was 182/3 with an aortic pressure of 180/51.   Angiography of left main, the left main has minor luminal  irregularities.   The proximal/mid stent is fairly normal in its first 10 mm.  There is  then some mild restenosis followed by a more intense restenosis of about  70%.  This plaque extends outside the stent for another 3-4 mm and then  gradually tapers toward a normal vessel.  The total length of this  plaque appears to be around 16 mm in length.   There is a distal stenosis of 80%.  The distal LAD stenosis was present  during her previous heart catheterization, but it was not as severe.   She has a tight stenosis in the first septal perforator of about 80%.   The left circumflex artery has a proximal 40%-50% stenosis just after  the takeoff.  The distal circumflex artery is not seen, but is assumed  to be occluded because it appears to fill via collateral vessels.  The  obtuse marginal arteries are normal.   The right coronary artery is fairly normal.  There was some deep  throating of the guide resulting  in some dampening of the waveform.  The vessel appeared to be fairly normal.  In addition, it appeared to be  normal last year during heart catheterization.  The posterior descending  artery and the posterolateral segment artery are normal.   The left ventriculogram was performed in a 30-RAO position.  It reveals  normal left ventricular systolic function.  The ejection fraction is  around 60%-65%.   PCI, the patient was given Angiomax.  A 6-French Judkins left #4 guide  was positioned up into the left main.  A Prowater wire was positioned  down into the distal LAD.  ACT was 394.   A 3.25 x 16-mm Taxus-Atom stent was positioned along the distal  stenosis.  It was deployed at 12 atmospheres for 30 seconds.  At this  point, this balloon was removed and a 3.0 x 20-mm Taxus Liberte was  positioned in the prox/mid lesion.  It was deployed at 12 atmospheres  for 25 seconds.   This balloon was removed  and a post-stent dilatation was achieved on the  distal stent using a 2.5 by a 15 mm Quantum Monorail.  It was inflated  up to 12 atmospheres for 25 seconds, which gave [place] a very nice  angiographic result.  The more proximal stent was postdilated using a  3.25 x 20-mm Quantum Monorail.  It was positioned over the new stent,  which was located just distal to the former stent.  It was inflated up  to 15 atmospheres for 40 seconds.  This gave some very nice angiographic  result.   The patient did have some hypertension and was given hydralazine at the  end of the case.   COMPLICATIONS:  None.   CONCLUSION:  Successful PTCA and stenting of the mid and distal LAD.     ______________________________  Vesta Mixer, M.D.    ______________________________  Vesta Mixer, M.D.    PJN/MEDQ  D:  12/20/2007  T:  12/21/2007  Job:  [id number]   cc:   Loraine Leriche A. Perini, M.D.

## 2010-11-15 NOTE — H&P (Signed)
NAMEJANETTE, [name]              ACCOUNT NO.:  [id number]   MEDICAL RECORD NO.:  [id number]          PATIENT TYPE:  INP   LOCATION:  5038                         FACILITY:  [facility]   PHYSICIAN:  Mark A. Perini, M.D.   DATE OF BIRTH:  10/21/1929   DATE OF ADMISSION:  05/12/2007  DATE OF DISCHARGE:                              HISTORY & PHYSICAL   CHIEF COMPLAINT:  Right arm fracture.   HISTORY OF PRESENT ILLNESS:  [name] is a pleasant 77 year old female with  a history significant for osteoporosis, coronary disease,  hyperlipidemia, hypertension and multiple medicine intolerances.  She  fell on her right side yesterday.  She denied any loss of consciousness.  In the emergency room she was found to have a comminuted right humerus  fracture.  However, she also had significant left knee bruising and  plain x-rays do not show any fracture, but she is unable to ambulate.  She also already has poor function of her left upper extremity due to a  previous severe wrist fracture.  Therefore, she is severely  incapacitated at this time and cannot be cared for at home.  She will be  admitted and will probably need placement.   PAST MEDICAL HISTORY:  1. Atherosclerotic coronary disease, status post LAD stent in June      2008.  2. Hyperlipidemia.  3. Hypertension.  4. Status post right knee arthroplasty.  5. Gastroesophageal reflux.  6. Hypothyroid.  7. Depression.  8. History of D&C, hysterectomy, varicose vein procedure.  9. Open reduction and internal fixation of right femur with an IM nail      in 2006.  10.Open reduction and internal fixation of left wrist in 2006.  11.Severe osteoporosis.   ALLERGIES:  SULFA, IODINE, LESCOL, CODEINE cause hives, but she is  tolerating the Vicodin currently.  CELEBREX caused hives and ZOCOR  caused hives and a rash.   MEDICATIONS:  1. Atenolol 25 mg daily.  2. Lexapro 5 mg daily.  3. Aspirin 81 mg daily.  4. Levoxyl 125 mcg daily.  5. Plavix 75  mg daily.  6. Potassium chloride 20 mEq daily.  7. Prilosec 20 mg daily.  8. Vitamin D 50,000 units each week.  9. Darvocet as needed.  10.Boniva IV, which she has received one dose.   SOCIAL HISTORY:  She lives with her husband.  She has no tobacco, no  alcohol, no drug use.   FAMILY HISTORY:  Noncontributory.   REVIEW OF SYSTEMS:  She denies any fevers or chest pain.   PHYSICAL EXAM:  Temperature 98.1, pulse 52, respiratory rate 20, blood  pressure 117/59, 95% saturation on room air.  Weight is 86 kg.  She is in no acute distress.  LUNGS:  Clear to auscultation bilaterally with no wheezes, rales or  rhonchi.  HEART:  Regular rate and rhythm with no murmur, rub, or gallop.  ABDOMEN:  Soft, nontender, nondistended, with no masses.  The left knee had significant ecchymosis and swelling about it.  The  right arm is immobilized in a sling.   LABORATORY DATA:  Pending.   X-rays show  a comminuted right proximal humerus fracture just below the  head but no significant angulation or displacement.  The left knee plain  x-rays did not show any acute findings.   ASSESSMENT AND PLAN:  A 77 year old female with atherosclerotic coronary  disease, hyperlipidemia and severe osteoporosis, with a new right  humerus fracture.  This will be immobilized and we will consult  orthopedics.  Her bigger problem is that she has significant left knee  pain and swelling from the trauma of her fall and she is unable to  ambulate.  This coupled with her new right humerus fracture and her  already poor function of the left upper extremity makes her unable to be  cared for at home currently.  We will consult social work, physical  therapy and occupational therapy and she most likely will need short-  term placement, hopefully at [facility], where she has done this before.  She is on Boniva currently.  We may try Forteo again.  I think that we  tried this once before and she did not tolerate it.  If she cannot  take  Forteo we may at some point in the next 4-6 weeks give her a shot of  Reclast.  We will recheck her vitamin D level.      Mark A. Perini, M.D.  Electronically Signed     MAP/MEDQ  D:  05/13/2007  T:  05/13/2007  Job:  [id number]   cc:   Jonny Ruiz L. Rendall, M.D.  Vesta Mixer, M.D.

## 2010-11-15 NOTE — H&P (Signed)
[name], [name] NO.:  [id number]   MEDICAL RECORD NO.:  [id number]          PATIENT TYPE:  EMS   LOCATION:  MAJO                         FACILITY:  [facility]   PHYSICIAN:  Audery Amel, MD    DATE OF BIRTH:  May 04, 1930   DATE OF ADMISSION:  12/30/2006  DATE OF DISCHARGE:                              HISTORY & PHYSICAL   PRIMARY CARDIOLOGIST:  Vesta Mixer, M.D.   CHIEF COMPLAINT:  Chest pain.   HISTORY OF PRESENT ILLNESS:  Patient is a 77 year old white female with  past medical history notable for CAD, hypertension and hyperlipidemia  who presents with chest pain.  Patient states her pain began this  evening and awoke her from sleep.  She characterizes retrosternal  pressure without radiation.  There was associated shortness of breath.  She denies any nausea, vomiting, diaphoresis or palpitations.  Patient  checked her blood pressure and found it to be greater than 200 systolic.  The patient took one nitroglycerin pill and had some relief of her  symptoms, however, she has been told in the past that she has coronary  disease which could potentially be treated with a stent, therefore  presented to the hospital for further evaluation.  Of note, the patient  does note that her symptoms are worse when she lies flat.  She denies  any burning sensation or heartburn but does have a history of GERD.  In  the emergency department, she was chest pain-free.  Her initial EKG  revealed a sinus bradycardia with no evidence of injury or ischemia.  Her cardiac markers were negative x1.  Otherwise, she is without  complaints.   PAST MEDICAL HISTORY:  1. CAD, status post cardiac catheterization in 2006 which revealed an      80% lesion in a very small diagonal vessel and an 80% lesion in the      ramus vessel which was estimated to be 2 mm.  Her LV function was      normal.  2. Status post right total knee replacement.  3. Hypertension.  4. Hyperlipidemia.  5.  GERD.  6. Hypothyroidism.   ALLERGIES:  SULFA.   MEDICATIONS:  1. Tenoretic 50/25 daily.  2. Lexapro 10 mg daily.  3. Synthroid 125 mcg p.o. daily.  4. Nitroglycerin p.r.n.   REVIEW OF SYSTEMS:  As per HPI, otherwise complete review of systems is  negative.   SOCIAL HISTORY:  Patient lives [place], [place], with her husband.  She denies any tobacco, alcohol or illicit substance.   FAMILY HISTORY:  Noncontributory.   PHYSICAL EXAMINATION:  VITAL SIGNS:  Blood pressure 161/59, heart rate  50, O2 saturation 98% on room air.  GENERAL APPEARANCE:  She is alert and oriented x3 in no acute distress.  Pleasantly conversant.  HEENT:  Normocephalic and atraumatic.  EOMI, PERRL.  Nares patent.  O&P  is clear without erythema or exudate.  NECK:  Supple, full range of motion, no JVD, no palpable thyromegaly.  Carotid upstrokes equal and symmetric bilaterally with no audible  bruits.  LUNGS:  Clear to auscultation bilaterally with  normal respiratory  effort.  CARDIOVASCULAR:  Normal S1 and S2 with no audible murmurs, rubs, or  gallops.  PMI is not palpable.  Peripheral pulses are 2+ and symmetric  bilaterally.  ABDOMEN:  Soft, obese, nontender, nondistended, positive bowel sounds  with no hepatosplenomegaly.  EXTREMITIES:  No clubbing, cyanosis, or edema.  There is no rashes or  ulcerations.  NEUROLOGIC:  Grossly nonfocal.   LABORATORY DATA:  EKG shows sinus bradycardia with no evidence of acute  injury or ischemia.   Sodium 139, potassium 3.9, chloride 107, CO2 32, BUN 43, creatinine  pending, glucose 109.  White count 8.6, hematocrit 33, platelet count  208.  CK-MB 1.4, troponin I less than 0.05.   IMPRESSION:  1. Chest pain, rule out myocardial infarction.  2. History of coronary artery disease.  3. Hypertension.  4. Hyperlipidemia.  5. Gastroesophageal reflux disease.  6. Hypothyroidism.   PLAN:  From a cardiovascular standpoint, Mr. Vassel is currently  stable.   Her symptoms may represent angina, although there are some  unusual characteristics such as being exacerbated by lying flat.  Her  EKG shows no evidence of acute ischemia and her initial cardial mile  markers are negative x1.  We will admit the patient to telemetry and  cycle her cardiac mile markers.  Her blood pressure was elevated during  this episode, however, on my initial evaluation, blood pressure is  161/59.  We will continue with beta-blocker therapy although I will use  metoprolol 12.5 mg p.o. q.6h. while hospitalized.  We will continue with  hydrochlorothiazide 25 mg once daily.  The patient will need additional  blood pressure medication, however, I do not have her serum creatinine  at this point and will wait before starting an ACE inhibitor.  We will  check a fasting lipid profile in the a.m. and strong consideration  should be given to a Statin therapy.  We will continue with  nitroglycerin p.r.n. as needed for chest pain.  We will need to consider  a repeat catheterization to assess the status of her coronay anatomy as  her CAD may have progressed.      Audery Amel, MD  Electronically Signed     SHG/MEDQ  D:  12/30/2006  T:  12/30/2006  Job:  [id number]

## 2010-11-15 NOTE — Assessment & Plan Note (Signed)
OFFICE VISIT   SHATIRA, DOBOSZ  DOB:  10/02/1929                                        January 31, 2010  CHART #:  [id number]   HISTORY OF PRESENT ILLNESS:  The patient returns for follow up and  rhythm check status post coronary artery bypass grafting x3 and maze  procedure on July 06, 2009.  She was last seen when she was  hospitalized in February 2011 when she was admitted for evaluation of  chest pain by Dr. Elease Hashimoto.  Since then, the patient suffered a serious  fall and traumatic brain injury in March 2011.  She apparently stumbled  as she was coming out of the front steps of her house and fell and hit  her head.  She had a large scalp laceration as well as a subdural  hematoma for which she was hospitalized and monitored by the Trauma  Service and the Neurosurgical Service.  She sustained significant brain  injury, and her recovery since then has been slow.  She continues to  follow up with Dr. Elease Hashimoto and Dr. [name] for long-term attention to her  medical needs.  She reports that overall she has made some improvement,  but she is still confused.  She admits that she has problems with memory  difficulty as well as expressive aphasia.  Her mobility is limited.  She  has not had chest pain or shortness of breath.  She has occasional  headaches.  The remainder of the patient's past medical history is  unchanged and has been elucidated previously.   CURRENT MEDICATIONS:  Amiodarone, aspirin, Plavix, Coreg, Levoxyl,  Lexapro, Protonix, multivitamin, potassium, biotin, Os-Cal Ultra,  Reclast.   PHYSICAL EXAMINATION:  Notable for an elderly female in no acute  distress.  Blood pressure is 165/79, pulse 76 and regular.  Two-channel  telemetry rhythm strip demonstrates paced rhythm.  Oxygen saturation is  94% and 95% on room air.  Examination of the chest reveals a well-healed  median sternotomy scar.  The sternum is stable on palpation.  Auscultation  reveals clear breath sounds that are symmetrical  bilaterally.  No wheezes, rales, or rhonchi are noted.  Cardiovascular  exam includes regular rate and rhythm.  No murmurs, rubs, or gallops are  noted.  The abdomen is soft, nontender.  The extremities are warm and  adequately perfused.  All of the scars from endoscopic vein harvest in  both lower legs and thighs have healed.  The remainder of her physical  exam is noncontributory.   IMPRESSION:  The patient has had a very slow partial recovery since her  significant traumatic brain injury after her fall that she sustained in  March 2011.  This has dramatically effected her functional capacity.  Although she has improved, she is nowhere near back to her baseline.  She appears to be maintaining a paced rhythm.  She has not had any  further cardiac problems to our knowledge.   PLAN:  We will defer any decisions regarding long-term management of  amiodarone and/or Plavix therapy to Dr. Elease Hashimoto.  Given her physical  limitations, she certainly remains at risk for falls in the future, and  it is possible that stopping Plavix might decrease risk of recurrent  significant intracranial bleeding should the event that she have any  further falls in the future.  Again, we will defer this decision to Dr.  Elease Hashimoto and Dr. [name].  We will see the patient back in 6 months to see  how she is getting along.   Salvatore Decent. Cornelius Moras, M.D.  Electronically Signed   CHO/MEDQ  D:  01/31/2010  T:  02/01/2010  Job:  [id number]   cc:   Vesta Mixer, M.D.  Redge Gainer. Perini, M.D.

## 2010-11-15 NOTE — Op Note (Signed)
[name], [name]              ACCOUNT NO.:  [id number]   MEDICAL RECORD NO.:  [id number]          PATIENT TYPE:  AMB   LOCATION:  DSC                          FACILITY:  [facility]   PHYSICIAN:  John L. Rendall, M.D.  DATE OF BIRTH:  1929-07-28   DATE OF PROCEDURE:  10/02/2007  DATE OF DISCHARGE:                               OPERATIVE REPORT   PREOPERATIVE DIAGNOSIS:  Torn lateral meniscus.   SURGICAL PROCEDURES:  Lateral meniscectomy and local chondroplasty.   POSTOPERATIVE DIAGNOSIS:  Torn lateral meniscus.   SURGEON:  John L. Rendall, M.D.   ANESTHESIA:  General.   PROCEDURE:  Under general anesthetic, the left legs wrapped out with an  Esmarch and the tourniquet is used at 300 mm.  Leg holder was used.  After routine DuraPrep prep and drape, the leg was wrapped out with the  Esmarch and the tourniquet is raised at 300 mm.  Portals were blocked  with 2% Xylocaine with epinephrine.  Diagnostic arthroscopy reveals  normal medial meniscus and cruciate ligaments, complex tear of the  lateral meniscus with local grade II and III chondromalacia on the  femoral condyle.  Using combination of straight curved and upbiting  baskets, the lateral meniscus was resected back to stable meniscal rim  and using a combination of shaver and rasp, the very irregular lateral  femoral condyle was extensively smoothed.  Once this was completed, the  knee is further checked in the recesses and suprapatellar pouch and  patellofemoral articulation.  No other abnormalities seen.  The knee is  then infiltrated with Marcaine 0.25% with epinephrine and 4 mg of  morphine.  Tourniquet is let down at 12 minutes and a sterile  compression bandage was applied.  The patient returned to recovery in  good condition.      John L. Rendall, M.D.  Electronically Signed     JLR/MEDQ  D:  10/02/2007  T:  10/02/2007  Job:  [id number]

## 2010-11-15 NOTE — Discharge Summary (Signed)
[name], [name]              ACCOUNT NO.:  [id number]   MEDICAL RECORD NO.:  [id number]          PATIENT TYPE:  INP   LOCATION:  5038                         FACILITY:  [facility]   PHYSICIAN:  Mark A. Perini, M.D.   DATE OF BIRTH:  1930-02-11   DATE OF ADMISSION:  05/12/2007  DATE OF DISCHARGE:                               DISCHARGE SUMMARY   DISCHARGE DIAGNOSES:  1. Severe osteoporosis.  2. Fall with resultant right humerus fracture and left tibia plateau      fracture. The patient also has three vertebral compression      fractures at T12, L1 and L3, although these appear by plane x-ray      to most likely be chronic.  3. Atherosclerotic coronary disease status post LAD stent in June      2008.  4. Hyperlipidemia.  5. Hypertension.  6. History of right knee arthroplasty.  7. History of left wrist fracture.  8. Past history of right femur fracture with an intramedullary nail in      2006.  9. Gastroesophageal reflux.  10.Hypothyroid.  11.Depression.  12.Vitamin D insufficiency.  13.STATIN intolerance.   PROCEDURE:  Orthopedic consultation. MRI of the left knee which shows a  lateral meniscal tear and a tibial plateau fracture which is  nondisplaced.   DISCHARGE MEDICATIONS:  1. Atenolol 25 mg once daily.  2. Lexapro 5 mg once daily.  3. Aspirin 81 mg daily.  4. Levoxyl 125 mcg daily.  5. Plavix 75 mg daily.  6. Prilosec 20 mg p.o. b.i.d. with food.  7. Vitamin D 50,000 units once  a week.  8. Lovenox at a reduced dose of 30 mg subcu once daily to be treated      for 2 weeks and then this should be discontinued if the patient is      moving about more so.  9. Colace 100 mg twice daily.  10.MiraLax 17 grams in 8 ounces of water once daily.  11.Fleets enema as needed.   HISTORY OF PRESENT ILLNESS:  [name] who goes by [name] is a pleasant, 77-  year-old female who fell on her right side on the day of admission. She  denied any loss of consciousness. In the emergency  room, she was found  to have a comminuted right humerus fracture with minimal displacement.  She also had significant left knee bruising and pain and was admitted  for further care.   HOSPITAL COURSE:  Plain films of the left lower extremity did not show  any fracture, however, due to the pain and swelling an MRI was performed  which confirmed a tibial plateau fracture on the left side. She was  placed in a left lower extremity immobilizer and her right upper  extremity was placed in a sling. She was made nonweightbearing for her  right upper extremity and her left lower extremity. Physical therapy and  occupational therapy evaluated the patient. She is to have ice as needed  to her left knee and right shoulder. She will require time for these  fractures to heal and will require skilled nursing placement until  this  occurs.   DISCHARGE PHYSICAL EXAM:  Temperature 98.6, afebrile, pulse 70,  respiratory rate 18, blood pressure 143/69, 91-96% saturation on room  air.  She is in no acute distress. Alert and oriented x4.  LUNGS:  Clear to auscultation bilaterally with no wheezes, rales or  rhonchi.  HEART:  Regular rate and rhythm with no murmurs, rubs or gallops.  ABDOMEN:  Soft, nontender. There was no edema.   DISCHARGE LABORATORY DATA:  White count 7.9, hemoglobin 11.2, platelet  count 196,000. There was 61% segs, 14% lymphocytes, 11% monocytes, 6%  eosinophils. Sodium is 137, potassium 3.8, chloride 104, CO2 27, BUN 23,  creatinine 1.01, GFR 53. Glucose 106, calcium 8.8, 25-hydroxy vitamin D  level was 39. TSH was 1.626.   DISCHARGE INSTRUCTIONS:  Cissy is to be turned every 2 hours to avoid  bed sores. She should have all pressure taken off of her heels. She is  work with physical therapy and occupational therapy with guidance per  orthopedics. She is followup in 3 weeks with Dr. Sable Feil office. She  is to followup in 1-2 weeks after discharge with from the nursing home  with  Dr. [name]. Again she is on aspirin and Plavix due to her coronary  disease and her recent stent. She will be on low dose Lovenox 30 mg  subcutaneous once daily for DVT prophylaxis for the next 2 weeks. At  that time she will need reassessment to see if she needs any continued  Lovenox therapy for DVT prophylaxis. I recommend a once weekly complete  blood count and BMET check once a week to make sure she is not  developing anemia given the degree of blood thinning that she is on  currently. She is a full code status.      Mark A. Perini, M.D.  Electronically Signed     MAP/MEDQ  D:  05/15/2007  T:  05/15/2007  Job:  [id number]   cc:   Jonny Ruiz L. Rendall, M.D.  Vesta Mixer, M.D.

## 2010-11-15 NOTE — H&P (Signed)
[name] NO.:  [id number]   MEDICAL RECORD NO.:  [id number]           PATIENT TYPE:   LOCATION:                                 FACILITY:   PHYSICIAN:  Vesta Mixer, M.D. DATE OF BIRTH:  Jan 27, 1930   DATE OF ADMISSION:  12/20/2007  DATE OF DISCHARGE:                              HISTORY & PHYSICAL   [name] is an elderly female with a history of coronary artery  disease.  She is status post PTCA and stenting her LAD.  She presents  now with some episodes of chest discomfort.   The patient recently had a stress Cardiolite study, which revealed the  presence anteroapical and lateral ischemia.  She is referred for heart  catheterization for further evaluation.  Because [name] reports  having a funny feeling in her chest, she states it feels just the same  as it did before her angioplasty.  She had an angioplasty of her mid LAD  in June 2008.  We placed a 3.0 x 16 mm Taxus stent post dilated with a  3.25-mm Quantum Monorail.  She is overall done fairly well, but started  having some funny feeling in her chest several weeks ago.  She complains  of having some headaches and also some indigestion.  She did not take  any nitroglycerin.   CURRENT MEDICATIONS:  1. Levoxyl 325 mcg a day.  2. Atenolol and chlorthalidone 50 mg/25 mg - 0.5 tablet a day.  3. Prilosec once a day.  4. Calcium once a day.  5. Potassium chloride once a day.  6. Fish oil twice a day.  7. Lexapro 5 mg a day.  8. Multivitamin once a day.  9. Plavix 75 mg a day.  10.Boniva every 3 months.  11.Vitamin D once a week.   ALLERGIES:  She is allergic to SULFA,  LESCOL, and ZOCOR.   PAST MEDICAL HISTORY:  1. Coronary artery disease.  She is status post PTCA and stenting for      mid LAD.  2. Hypothyroidism   SOCIAL HISTORY:  The patient does not smoke.   FAMILY HISTORY:  Is unremarkable.   REVIEW OF SYSTEMS:  As noted in the HPI and is otherwise unremarkable.   On exam, she is an elderly female in no acute distress.  She is alert  and oriented x3.  Her mood and affect are normal.  Weight is 203, blood  pressure 138/70 with the heart rate of 50.  HEENT exam reveals normal  sclerae.  She has no bruits, no JVD, no thyromegaly.  Lungs are clear to  auscultation.  Heart regular rate S1-S2.  Abdominal exam reveals good  bowel sounds, and was nontender.  Extremities has no clubbing, cyanosis  or edema.  Neuro exam is nonfocal.   [name] presents with some unusual feelings in her chest.  I would  interpret this as her angina equivalent.  She states this is similar to  where the way she felt last year.  She has an abnormal stress Cardiolite  study.  We have discussed the risks, benefits,  and options of heart  catheterization.  She understands and agrees to proceed.           ______________________________  Vesta Mixer, M.D.     PJN/MEDQ  D:  12/19/2007  T:  12/19/2007  Job:  [id number]   cc:   Loraine Leriche A. Perini, M.D.

## 2010-11-15 NOTE — Assessment & Plan Note (Signed)
OFFICE VISIT   [name], [name]  DOB:  December 13, 1929                                        September 07, 2009  CHART #:  [id number]   HISTORY OF PRESENT ILLNESS:  The patient returns for followup status  post coronary artery bypass grafting x3 and modified Cox maze procedure  on July 06, 2009.  She was last seen here in the office on August 09, 2009.  Since that time, she was hospitalized briefly with an episode of  chest pain and mildly elevated cardiac enzymes consistent with a very  mild non-ST-segment elevation myocardial infarction.  Repeat  catheterization performed by Dr. Elease Hashimoto at that time demonstrated  significant small vessel disease involving the diagonal branches, which  were not grafted.  The left internal mammary artery graft was widely  patent to left anterior descending coronary artery and the vein graft to  the distal right coronary artery was widely patent.  The vein graft to  the posterolateral branch of the distal right coronary artery was not  well visualized, and it is possible that this could be occluded,  although the proximal portion of the vein graft can be seen on injection  of the vein graft to the right coronary artery.  The patient has been  treated medically and has done quite well since then.  She returns to  the office for further followup today.  She has had no further episodes  of chest pain.  She has not had problems with shortness of breath.  She  is still fairly weak and her physical mobility is limited as it was  prior to surgery.  She is accompanied to the office by her sister, and  she admits that she is still not getting around terribly well overall.  She has not had any dizzy spells or syncopal episodes.  She has not had  any tachy palpitations.  She has no chest pain.  She still has some  soreness along her sternotomy incision, but she denies any sensation of  clicking or motion of the sternum.  The remainder of her  review of  systems is unremarkable.  The remainder of her past medical history is  unchanged.   CURRENT MEDICATIONS:  Amiodarone, aspirin, Plavix, Coreg, Levoxyl,  Lexapro, Protonix, hydrochlorothiazide, ranolazine, Pepcid, propranolol,  and Reclast.   PHYSICAL EXAMINATION:  Notable for an obese elderly female who appears  her stated age in no acute distress.  Blood pressure 138/77 and pulse 77  and a two-channel telemetry rhythm strip demonstrates what appears to be  atrial paced rhythm.  Oxygen saturation is 95% on room air.  Examination  of the chest reveals a median sternotomy incision that is healing  nicely.  The sternum is stable on palpation.  Breath sounds are clear to  auscultation and symmetrical bilaterally.  No wheezes or rhonchi are  noted.  Cardiovascular exam is notable for regular rate and rhythm.  No  murmurs, rubs, or gallops are appreciated.  The abdomen is soft and  nontender.  The extremities are warm and well perfused.  There is some  mild tenderness in the left lower leg just below the terminal end of her  saphenous vein harvest.  There is no cellulitis in this area.  There may  be superficial vein thrombosis in one of the branch tributary  veins of  the saphenous system.  There is no surrounding cellulitis.  The  remainder of her physical exam is unremarkable.   IMPRESSION:  Slow, but satisfactory progress following coronary artery  bypass grafting and maze procedure.  The patient did have a mild non-ST-  segment elevation myocardial infarction for which she was cathed last  month.  This may have been related to the vein graft to the  posterolateral branch or to diagonal branches of the left anterior  descending coronary artery, which are not grafted.  Clinically, she has  done quite well.  She appears to be maintaining regular rhythm.   PLAN:  We have not made any recommendations or changes in her current  medications.  I agree with continuing to wean off  amiodarone and  stopping it at some point within the next month or 2 as long as her  rhythm remains stable.  We will plan to see her back in 4 months' time  for further followup and rhythm check.  I have strongly encouraged her  to take part in cardiac rehab program as I think this would help her  continue to improve her physical endurance and stamina.  All of her  questions have been addressed.   Salvatore Decent. Cornelius Moras, M.D.  Electronically Signed   CHO/MEDQ  D:  09/07/2009  T:  09/08/2009  Job:  [id number]   cc:   Vesta Mixer, M.D.  Redge Gainer. Perini, M.D.

## 2010-11-15 NOTE — Assessment & Plan Note (Signed)
OFFICE VISIT   [name], [name]  DOB:  1929/09/02                                        August 09, 2009  CHART #:  [id number]   HISTORY:  The patient is a 77 year old white female who is status post  coronary artery bypass grafting x3 on July 06, 2009 for left main and  three-vessel coronary artery disease.  Her postoperative course was  remarkable for a GI bleed requiring endoscopy with stapling and  epinephrine injection for control of bleeding.  Additionally, she had  paroxysmal atrial fibrillation and some mild postoperative renal  insufficiency.  Currently, she reports that her endurance is improving,  although she does state that she is frequently quite tired.  She has not  started cardiac rehab phase II and is not sure if she feels that she  wants to do this.  I have encouraged her to do this program and she will  decide.  She denies any recent fevers, chills, or other constitutional  symptoms.  She denies shortness of breath or chest pain.  She does have  some lower extremity edema.  Her pain appears to be well controlled with  Tylenol.   CHEST X-RAY:  A chest x-ray was obtained on today's date and it shows  some mild bilateral lower base atelectasis.  There is no findings of  congestive failure or significant infiltrates.   PHYSICAL EXAMINATION:  Vital Signs:  Blood pressure 182/92, pulse is 71,  respirations 18, and oxygen saturation is 97% on room air.  General:  This is an elderly white female in no acute distress.  Pulmonary:  Slightly diminished breath sounds in the bases, otherwise clear.  Cardiac:  Regular rate and rhythm.  Normal S1 and S2.  No murmurs or  gallops.  Extremities:  Some 1+ bilateral lower extremity edema.  Incisions are inspected, healing well without evidence of infection.  There is no sternal instability.  No drainage.   ASSESSMENT:  The patient is making adequate recovery from her surgical  revascularization.  I  discussed restarting her Benicar at this time for  hypertension and she will start at half of her previous dose, 10 mg  daily and she is aware that she needs to follow up with her primary  physician in this regard.  Additionally, I have encouraged to continue  to increase her ambulation as tolerated and continue to use her  incentive spirometer.  She understands and agrees to do this.  We will see her again in the office in 1 month and if there are no  surgically-related issues at that time, we will follow her on a p.r.n.  basis.  We will obtain a chest x-ray at that time.   Rowe Clack, P.A.-C.   Sherryll Burger  D:  08/09/2009  T:  08/10/2009  Job:  [id number]   cc:   Vesta Mixer, M.D.  Redge Gainer. Perini, M.D.

## 2010-11-15 NOTE — Discharge Summary (Signed)
[name], [name]              ACCOUNT NO.:  [id number]   MEDICAL RECORD NO.:  [id number]          PATIENT TYPE:  INP   LOCATION:  6533                         FACILITY:  [facility]   PHYSICIAN:  Vesta Mixer, M.D. DATE OF BIRTH:  [birth year]   DATE OF ADMISSION:  12/30/2006  DATE OF DISCHARGE:                               DISCHARGE SUMMARY   DISCHARGE DIAGNOSES:  1. Coronary artery disease - status post percutaneous transluminal      coronary angioplasty and stenting of her mid left anterior      descending artery.  2. Dyslipidemia with markedly elevated LDL levels.  3. Statin intolerance.  4. Hypertension.  5. Hypothyroidism.  6. Gastroesophageal reflux disease.   DISCHARGE MEDICATIONS:  1. Plavix 75 mg a day.  2. Aspirin 81 mg a day.  3. Colace 100 mg a day.  4. Toprol-XL 50 mg a day.  5. Lexapro 10 mg a day.  6. Synthroid 0.125 mg a day.  7. Hydrochlorothiazide 25 mg a day.  8. Protonix 40 mg a day.  9. Nitroglycerin as needed.   DISPOSITION:  The patient will see Dr. Elease Hashimoto in 1-2 weeks for followup.  She is to see Dr. [name] this week for a physical exam.  She has been  instructed to eat a very low-fat, low-salt diet.   HISTORY:  [name] is a 77 year old female with a history of coronary  artery disease.  She is admitted to the hospital for episodes of chest  discomfort and chest pressure.  Please see dictated H&P for further  details.   HOSPITAL COURSE BY PROBLEMS:  CHEST PAIN.  The patient had chest  pressure which was relieved with nitroglycerin.  She was referred for  heart catheterization and was found to have a tight 80% mid LAD  stenosis.  She underwent PTCA and stenting of her mid LAD using a 3.0 x  16-mm Taxus stent.  It was postdilated using a 3.25 x 15-mm Quantum  Monorail inflated up to 18 atmospheres.  She tolerated the procedure  quite well.  She did not have any significant problems.  She is now  discharged in satisfactory condition.   We  have instructed her to eat an extremely low-fat diet.  She has a  family history of high cholesterol levels and does not tolerate statin  medications or Zetia.  We will see if she has tried [employer] and may give  that a try.  She has been asked to call and make an appointment with [place].   All of her other medical problems remained stable.           ______________________________  Vesta Mixer, M.D.     PJN/MEDQ  D:  01/01/2007  T:  01/01/2007  Job:  [id number]   cc:   Loraine Leriche A. Perini, M.D.
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
## 2010-11-18 NOTE — Op Note (Signed)
[name], [name]              ACCOUNT NO.:  [id number]   MEDICAL RECORD NO.:  [id number]          PATIENT TYPE:  INP   LOCATION:  0001                         FACILITY:  [facility]   PHYSICIAN:  John L. Rendall, M.D.  DATE OF BIRTH:  1929-11-18   DATE OF PROCEDURE:  06/11/2006  DATE OF DISCHARGE:                               OPERATIVE REPORT   PREOPERATIVE DIAGNOSIS:  Osteoarthritis, right knee with retained  retrograde femoral rod.   SURGICAL PROCEDURE:  Left total knee report arthroplasty with computer  navigation assistance following removal of retained retrograde femoral  rod and screws.   POSTOPERATIVE DIAGNOSIS:  Osteoarthritis, right knee with retained  retrograde femoral rod.   SURGEON:  Dr. Priscille Kluver   ASSISTANT:  Rexene Edison, [facility]   ANESTHESIA:  General with a femoral nerve block.   PATHOLOGY:  The patient has valgus knee and is now 1-1/2 years following  retrograde nailing of a mid shaft femur fracture.  There is 1 screw just  below the lesser trochanter and 2 screws in the supracondylar area of  the femur laterally and an approximate 24 cm rod.  The proximal screw is  anterior-posterior.  In the knee, there is bone against bone laterally  and significant peeling bone medially and end-stage osteoarthritis of  the patellofemoral articulation.   PROCEDURE:  Under general anesthesia, the right leg is prepared with  Hibiclens due to IODINE allergy and draped as a sterile field.  The  screws were removed from the retrograde nail first.  A 1-1/2 inch  incision is made at the lateral cortex of the distal femur.  The screw  heads are found beneath the fascia, and dissection is carried through  the fascia 1 screw head at a time, and they are backed out back with a  6.5 screwdriver and removed.  Attention is then turned to the superior  screw and 1 inch incision is made lateral to the vessels and in the  region of the previous percutaneous puncture hole.  A blunt dissection  is carried out down to the femur, and the screw head is palpable.  It is  then backed out with a 4.5 screw driver, and the screw is removed.  These 2 wounds were then closed with 0 Vicryl subcu and clips.  Dressing  is applied over these, and attention is then turned to the knee.  Leg is  wrapped out with an Esmarch, and a sterile tourniquet is applied to the  proximal thigh.  The tourniquet is elevated at 350 mm.  Midline incision  is made.  The patella is everted.  The femur is sized to a standard  plus.  Knee debridement is carried out in preparation for computer  mapping, and Schanz pins are then placed through a separate puncture in  the anteromedial tibia superiorly and in the distal medial femur  anteriorly.  Once these are in place, the arrays are set up.  The  femoral head is identified; medial and lateral malleoli are identified,  and proximal tibia and distal femur are then mapped to within 0.5 mm of  reproducibility.  Once this is completed, proximal tibial resection is  carried out using the computer to place the guide.  Once this is done,  it should be noted the bone was extremely soft in the tibia and as I  placed the tensioner, large depressions were made on the edges of the  tibia.  In placing the tensioner and releasing the valgus knee, S&E was  first brought into extension.  It was in 8 degrees valgus and 6 degrees  flexed, that is prior to any releases.  Releases were then done  percutaneously using an 11 blade of the IT band, and the knee was  brought out to 1 degree of valgus or less.  In the space of doing this  and gently stretching the piecrusted lateral structures, a pop was  audible medially.  This turned out to be the cortical margin of the  tibia with attached MCL on it.  The MCL had been released in its  anterior superficial fibers, but the deep fibers were thus released all  around the posterior medial cortex.  The bony cortex was left attached  to the  periosteal sleeve, and the case continued.  Due to the fact that  the flexion gap could not be appropriately obtained with loose medial  structures, at this point, the noncomputer instruments were used, and  the anterior and posterior flare of the distal femur were resected, and  the distal femur was resected for a 12.5 flexion gap and extension gap.  Once this was done, the recessing guide was used.  Attention was then  turned to the proximal tibia.  It was sized to a #3.  Center peg hole  with keel was placed.  Once this was done, the trial components were put  in place, #3 tibia, 12.5 bearing and standard plus femur.  These gave  satisfactory fit, alignment, and stability.  The patella was then  osteotomized, and the 3 peg trial was placed with the capsular sleeve  closed with towel clips.  The knee was within 1 degree of anatomical  axis and within 2 degrees of full extension, being in slight  hyperextension.  At this point, permanent components were obtained.  Bony surfaces were prepared with pulse irrigation.  All components were  then cemented in place, and tourniquet was let down at 1 hour 10  minutes.  The multiple small vessels were then cauterized.  A medium  Hemovac drain was inserted.  The medial capsular structures were then  carefully coapted with #1 Tycron, closing the periosteal sleeve around  the anteromedial tibia and closing the knee capsule in the usual manner.  Once this is closed, it is tested.  The drawer test is less than a  centimeter in extension.  The knee is fully stable in partial flexion.  The knee is slightly loose medially, felt due to the avulsion along the  margin of the medial tibia.  Decision is made to go with knee  immobilizer and the hinged knee brace and let her be mobilized once this  was in place.  Remainder of the wound closure was uneventful with 0 and  2-0 Vicryl and skin clips.  Operative time 1 hour 30 minutes on the total knee and 15-20  minutes on removal of the rod and screws.  It was  an Ace rod with 3 screws.  The patient returned to recovery in good  condition.      John L. Rendall, M.D.  Electronically Signed  JLR/MEDQ  D:  06/11/2006  T:  06/11/2006  Job:  [id number]
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
## 2010-11-18 NOTE — H&P (Signed)
NAMEPEREL, HAUSCHILD              ACCOUNT NO.:  [id number]   MEDICAL RECORD NO.:  [id number]          PATIENT TYPE:  INP   LOCATION:  NA                            FACILITY:  [facility]   PHYSICIAN:  Dineen Kid. Rana Snare, M.D.    DATE OF BIRTH:  03-27-1930   DATE OF ADMISSION:  DATE OF DISCHARGE:                                HISTORY & PHYSICAL   DATE OF ADMISSION:  March 28, 2004   HISTORY OF PRESENT ILLNESS:  Mrs. Dungee is a 77 year old nulligravida  white female with chief complaint of her bladder falling, with mild urinary  incontinence and also pressure.  She currently gets up three to five times  at night because of the pressure.  She says when she stands up she does leak  on the floor.  She has tried Ditropan last year, could not urinate at all  with this, had to discontinue it, and also complains of a lot more pressure  with standing and also at bedtime.  Denies any rectal symptoms such as  pressure, constipation, or digitalization.  She was recently evaluated with  Dr. Retta Diones the urologist who performed urodynamics and she had no  documented stress urinary incontinence on urodynamics, and he thought that  it was more strictly urgency incontinence.  He did state that she has a  significant cystocele, a very low leak point pressure, and urgency  incontinence.  He did recommend that she go ahead and have an anterior and  posterior colporrhaphy, and then if she develops symptoms later then he  could proceed with a SPARC procedure at a later time.   PAST MEDICAL HISTORY:  Significant for hypothyroidism, hypertension,  osteoporosis, and elevated cholesterol, as well as gastroesophageal reflux  disease.   PAST SURGICAL HISTORY:  She had a hysterectomy, a right knee arthroscopy,  bilateral vein stripping and for what sounds like a history of venous stasis  ulcers.   Currently she is on:  1.  Levoxyl at 125 mcg a day.  2.  Zetia 10 mg a day.  3.  Atenolol/hydrochlorothiazide at  50/25 a day.  4.  Forteo self-administered.  5.  A baby aspirin.  6.  Motrin.  7.  Prilosec at 20 mg a day.   ALLERGIES:  She gets a rash from SULFA and CODEINE.   SOCIAL HISTORY:  She is married, she does not have any children.  She works  at [employer], and she is not a smoker, does not use alcohol.   PHYSICAL EXAMINATION:  VITAL SIGNS:  Her blood pressure is 132/78, her  weight 206.  HEART:  Regular rate and rhythm.  LUNGS:  Clear to auscultation bilaterally.  ABDOMEN:  Nondistended, nontender.  PELVIC:  She has a third degree cystocele with some loss of the UV angle.  A  second degree rectocele.  Rectovaginal confirms the above.  No masses are  palpable.   IMPRESSION AND PLAN:  Symptomatic pelvic relaxation without stress urinary  incontinence per Dr. Retta Diones on urodynamics, more with urge incontinence.  She desires definitive surgical intervention and we plan to proceed with  anterior  and posterior colporrhaphy.  I discussed the risks and benefits at  length which include but are not limited to risk of infection; bleeding;  damage to the bowel, bladder, or ureters; possibility that this may recur in  the future; risks associated with anesthesia or blood loss; risks associated  with bladder injury or prolonged catheter wear; or continued leaking of  urine and needing surgery in the future from this.  She does give her  informed consent.  We also discussed the recovery period, catheter wear, and  she and her friend have a good understanding and she wishes to proceed.      DCL/MEDQ  D:  03/23/2004  T:  03/23/2004  Job:  [id number]

## 2010-11-18 NOTE — Consult Note (Signed)
NAME:  [name], VOLK                        ACCOUNT NO.:  [id number]   MEDICAL RECORD NO.:  [id number]                   PATIENT TYPE:  REC   LOCATION:  FOOT                                 FACILITY:  [facility]   PHYSICIAN:  Jonelle Sports. Sevier, M.D.              DATE OF BIRTH:  05/27/1930   DATE OF CONSULTATION:  11/06/2002  DATE OF DISCHARGE:                                   CONSULTATION   HISTORY:  This 77 year old white female is seen at the courtesy of Dr.  [name] for assistance with management of a persistent venous ulcer on the  left lower extremity that has been present now for some six to eight weeks.   Patient has had longstanding varicose veins, predominantly superficial, but  with some evidence of chronic edema and chronic venous insufficiency in the  left lower extremity.  She has not had previous ulceration.   With that background history and without known trauma, she developed, some  six to eight weeks ago, a small, ulcerated area on the medial aspect of the  distal left lower extremity, several centimeters proximal to the medial  malleolus.  This was treated by Dr. [name] with some Accuzyme and Hydrogel  but has not shown substantial improvement.  There has been some bloody  drainage; no definite recognized infection.   She is here now for our evaluation and advice.   PAST MEDICAL HISTORY:  1. Notable for osteoarthritis and osteoporosis.  2. Hypertension.  3. Hypothyroidism.  4. Congestive heart failure.  5. Hyperlipidemia.   ALLERGIES:  She is allergic to SULFA.   CURRENT MEDICATIONS:  1. Her regular medications include Levoxyl.  2. Premarin.  3. Atenolol.  4. Vitamin E.  5. Aspirin.  6. Calcium.  7. Naproxen.  8. Actonel.  9. Zetia.  10.      Prilosec.  11.      Potassium.  12.      Magnesium.  13.      Occasional Aleve.   PHYSICAL EXAMINATION:  EXTREMITIES:  Examination today is limited to the  distal lower extremities.  Both extremities are  involved with extensive  superficial varicose veins with chronic 2 to 3+ edema, which is nonpitting  in nature.  Remarkably, there is no evidence of chronic stasis staining at  the legs.  Her skin temperatures are equal and essentially symmetrical.  Her  pulses are everywhere palpable and are biphasic on Doppler determination.  Monofilament testing shows that protective sensation is present throughout  both feet.  There are no significant calluses or similar problems on the  feet proper.  On the left lower extremity, approximately 3 cm proximal to  the medial malleolus, is an ill-defined, shallow ulcer with some slough in  its base and with some surrounding erythema.  There is no undue tenderness  in the area and no undue warmth.   IMPRESSION:  Venous stasis ulceration, left lower extremity.  DISPOSITION:  1. The patient is given instructions regarding foot care by video with some     nurse reinforcement.  2. It was discussed with the patient that we need to use an occlusive     technique in all likelihood to heal this lesion, and she is in agreement.  3. Accordingly, the wound is cleansed and then treated with Tannophil,     covered by an X-Cellam pad, and the extremity wrapped in a Profore wrap.  4. Followup visit will be with this clinic in six days.                                               Jonelle Sports. Cheryll Cockayne, M.D.    RES/MEDQ  D:  11/06/2002  T:  11/07/2002  Job:  [id number]   cc:   Loraine Leriche A. [name], M.D.  [address]  [place]  [place] [address]  Fax: [phone]

## 2010-11-18 NOTE — Discharge Summary (Signed)
[name], [name]              ACCOUNT NO.:  [id number]   MEDICAL RECORD NO.:  [id number]          PATIENT TYPE:  INP   LOCATION:  6525                         FACILITY:  [facility]   PHYSICIAN:  Vesta Mixer, M.D. DATE OF BIRTH:  09/10/1929   DATE OF ADMISSION:  12/20/2007  DATE OF DISCHARGE:  12/21/2007                               DISCHARGE SUMMARY   DISCHARGE DIAGNOSES:  1. Coronary artery disease.  2. Status post percutaneous transluminal coronary angioplasty and      stenting of the proximal/mid left anterior descending artery.  3. Hypothyroidism.   DISCHARGE MEDICATIONS:  1. Levoxyl 0.125 mg a day.  2. Tenoretic 50 mg/25 mg - one-half tablet a day.  3. Prilosec once a day.  4. Calcium twice a day.  5. Centrum Silver once a day.  6. Potassium chloride over the counter once a day.  7. Lexapro 10 mg - one-half tablet a day.  8. Plavix 75 mg a day.  9. Reclast once a day.  10.Nitroglycerin 1/150 sublingually as needed.  11.Darvocet as needed.   DISPOSITION:  The patient will see Dr. Elease Hashimoto in 1-2 weeks.   HISTORY:  [name] is a 77 year old female with a history of coronary  artery disease.  She is admitted with episodes of chest discomfort.  Please see dictated H&P for further details.   HOSPITAL COURSE:  Coronary artery disease.  The patient underwent heart  catheterization.  She was found to have restenosis of her proximal LAD  stent.  The distal aspect of the stent was stenosed around 70%.  We  performed PTCA and stenting of the stent using 2.25- x 16-mm Taxus Atom  stent.  It was deployed at 12 atmospheres and post stent dilatation was  achieved using a 2.5- x 15-mm Quantum Monorail up to 12 atmospheres.  Following this, a 3.0- x 20-mm Taxus Liberte stent was positioned in the  proximal/mid lesion and was deployed at 12 atmospheres for 25 seconds.  Post stent dilatation was achieved using a 3.25- x 20-mm Quantum  Monorail.   This gave [place] a very nice  angiographic result.  The patient did not have  any further episodes of chest pain.  She was stable leaving the lab.  She will follow up with Dr. Elease Hashimoto as noted above.            ______________________________  Vesta Mixer, M.D.     PJN/MEDQ  D:  01/27/2008  T:  01/27/2008  Job:  [id number]

## 2010-11-18 NOTE — Cardiovascular Report (Signed)
[name], [name] NO.:  [id number]   MEDICAL RECORD NO.:  [id number]          PATIENT TYPE:  OIB   LOCATION:  6501                         FACILITY:  [facility]   PHYSICIAN:  Vesta Mixer, M.D. DATE OF BIRTH:  Jan 30, 1930   DATE OF PROCEDURE:  07/13/2004  DATE OF DISCHARGE:                              CARDIAC CATHETERIZATION   [name] is a 77 year old female with a history of chest pains and chest  tightness.  She has significant chest tightness with any sort of exertion.  She recently had a stress Cardiolite study which revealed no evidence of  ischemia.  She had normal left ventricular systolic function.  She was  referred for heart catheterization because of her persistent symptoms.   The right femoral artery was easily cannulated using the modified Seldinger  technique.   HEMODYNAMICS:  LV pressure was 147/11 with an aortic pressure of 147/57.   ANGIOGRAPHY:  Left main:  The left main is fairly smooth and normal.   The left anterior descending artery is fairly normal in the proximal  segment.  The LAD gives off several diagonal branches.  These diagonal  branches are fairly small.  The second diagonal vessel has a tight 80-90%  stenosis, but the vessel diameter is approximately 1 mm in this vessel, and  its branches are not a candidate for angioplasty.   The left circumflex artery is a relatively small vessel.  It gives off  several small marginal branches.  It appears to give off a conus branch  which goes around to the posterolateral wall.  This branch appears to be  occluded very distally, and the distal posterolateral branch fills via  collateral filling from the left system.  This vessel is small and also is  not a candidate for angioplasty.   There is a ramus intermediate branch which appears to be approximately 2 mm  in diameter.  There is a 90% stenosis in the middle aspect of this vessel.  This vessel may be a candidate for a very small  stent.   The right coronary artery is fairly large and dominant.  The posterior  descending artery and the posterolateral segment arteries are normal.  There  is no evidence of distal occlusion to suggest that the posterolateral branch  seen filling late is due to an occlusion of the right coronary artery.   The left ventriculogram was performed in a 30 RAO position.  It reveals  overall normal left ventricular systolic function.  The ejection fraction is  around 65%.   COMPLICATIONS:  None.   CONCLUSIONS:  Diffuse distal coronary artery disease.  She has a stenosis in  a small to moderate size ramus intermediate branch that may be a candidate  angioplasty.  She has stenoses in the very distal second diagonal vessels  which are not a candidate for angioplasty.  She also has a complete  occlusion of a posterolateral branch.  It is  difficult to find the proximal aspect of this vessel, but it most likely  comes from the circumflex distribution.  This branch is not a candidate for  angioplasty since we cannot find the origin.  We will consider bringing her  back for angioplasty and stenting of the ramus intermediate branch.  The  other lesions will need to be treated medically.       PJN/MEDQ  D:  07/13/2004  T:  07/13/2004  Job:  [id number]
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
## 2010-11-18 NOTE — Discharge Summary (Signed)
[name], [name]              ACCOUNT NO.:  [id number]   MEDICAL RECORD NO.:  [id number]          PATIENT TYPE:  INP   LOCATION:  9307                          FACILITY:  [facility]   PHYSICIAN:  Dineen Kid. Rana Snare, M.D.    DATE OF BIRTH:  [birth year]   DATE OF ADMISSION:  03/28/2004  DATE OF DISCHARGE:  03/31/2004                                 DISCHARGE SUMMARY   HISTORY OF PRESENT ILLNESS:  Ms. Meanor is a 77 year old, nulligravida  white female. She has complained of her bladder falling out with mild  urinary incontinence.  Has pressure and gets up 3-5 times a night because of  pressure and when she stands she leaks on the floor. She tried  antispasmodics, unable to urinate at all. She mostly complains of pressure  with standing and also at bedtime denies any rectal symptoms such as  pressure, constipation or __________.  She is also followed by Dr.  Retta Diones, urologist, who performed urodynamics and states that she had no  stress urinary incontinence.  He stated it is more urgency incontinence and  she does have a significant cystocele with a very low leak point pressure.  He recommended that she go ahead and proceed with anterior and posterior  colporrhaphy.   HOSPITAL COURSE:  The patient underwent anterior and posterior colporrhaphy,  the surgery was uncomplicated. Estimated blood loss was less than 50 mL.  The patient's postoperative course was uncomplicated. By postoperative day  #1, her hemoglobin was 11.3, she was passing flatus and tolerating a regular  diet, vaginal packing was removed. By postoperative day #2, catheter had  been removed and she was voiding without difficulty with post void residual  of zero.  By postoperative day #3, she was ambulating well, urinating  normally and discharged home in stable condition, tolerating a regular diet.   DISPOSITION:  The patient was discharged to home with followup in the office  in 2-3 weeks, offered a prescription for Darvocet  #30, told to return for  increased pain, fever, bleeding or difficulty urinating.      DCL/MEDQ  D:  05/12/2004  T:  05/13/2004  Job:  [id number]

## 2010-11-18 NOTE — Cardiovascular Report (Signed)
NAMEDALE, STRAUSSER NO.:  [id number]   MEDICAL RECORD NO.:  [id number]          PATIENT TYPE:  OIB   LOCATION:  6501                         FACILITY:  [facility]   PHYSICIAN:  Vesta Mixer, M.D. DATE OF BIRTH:  1930/03/29   DATE OF PROCEDURE:  07/13/2004  DATE OF DISCHARGE:                              CARDIAC CATHETERIZATION   Ms. Divirgilio is a 77 year old female.  She presents with episodes of  shortness of breath and chest tightness with exertion.  She had a stress  Cardiolite study which was essentially unremarkable.  She did not have any  evidence of ischemia, and she had normal left ventricular systolic function.   She is referred for heart catheterization because of her persistent  symptoms.   The right femoral artery was easily cannulated using a modified Seldinger  technique.  A 4-French sheath and catheter were used for angiography.   HEMODYNAMICS:  LV pressure was 147/11 with an aortic pressure of 147/57.   ANGIOGRAPHY:  Left main:  The left main coronary artery is relatively smooth  and normal.   The left anterior descending artery has mild irregularities.  There are  several small diagonal branches.  The second diagonal branch is a very small  branch and has significant stenoses ranging between 80-90% in severity.  These vessels are less than 1 mm in size and are not candidates for  angioplasty.   The ramus intermediate branch is a small to moderate size branch.  There is  an 80-90% stenosis in the mid segment of this branch.  This branch is  perhaps 2 mm in diameter and may be a candidate for some a small stent.   The left circumflex artery is a moderate size vessel.  It gives off several  small marginal branches.  It gives off a small to medium size conus branch  which goes around and supplies the posterolateral wall.  This branch is  occluded very distal, and the distal aspect of this vessel can be seen  filling via left-to-left  collaterals.  The vessel is not a candidate for  angioplasty.   The right coronary artery is large and dominant.  The posterior descending  artery and the posterolateral segment arteries are fairly normal.  I do not  see any evidence of a distal posterolateral branch to suggest that the late  filling branch is coming from the right.   The left ventriculogram was performed in the third RAO position.  It reveals  overall well-preserved left ventricular systolic function with an ejection  fraction of  Dictation ends at this point.       PJN/MEDQ  D:  07/13/2004  T:  07/13/2004  Job:  [id number]   cc:   Loraine Leriche A. [name], M.D.  [address]  [place]  [place] [address]  Fax: [phone]
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
## 2010-11-18 NOTE — Op Note (Signed)
[name], [name]              ACCOUNT NO.:  [id number]   MEDICAL RECORD NO.:  [id number]          PATIENT TYPE:  INP   LOCATION:  9399                          FACILITY:  [facility]   PHYSICIAN:  Dineen Kid. Rana Snare, M.D.    DATE OF BIRTH:  [birth year]   DATE OF PROCEDURE:  03/28/2004  DATE OF DISCHARGE:                                 OPERATIVE REPORT   PREOPERATIVE DIAGNOSES:  Symptomatic pelvic relaxation with cystocele and  rectocele without stress urinary incontinence per Dr. Retta Diones.   POSTOPERATIVE DIAGNOSES:  Symptomatic pelvic relaxation with cystocele and  rectocele without stress urinary incontinence per Dr. Retta Diones.   PROCEDURE:  Anterior and posterior colporrhaphy.   SURGEON:  Dineen Kid. Rana Snare, M.D.   ASSISTANT:  Guy Sandifer. Henderson Cloud, M.D.   ANESTHESIA:  General by LMA.   INDICATIONS FOR PROCEDURE:  [name] is a 77 year old, nulligravid white  female with a chief complaint of her bladder falling out with mild urinary  incontinence and pressure who gets up 3-5 times a night because of the  pressure and when she stands she does leak on the floor. She has tried  antispasmodics and was unable to urinate at all and had to discontinue that.  She mostly complains of pressure with standing and also at bedtime, denies  any rectal symptoms such as pressure, constipation or digitalization  recently evaluated by Dr. Retta Diones who is a urologist and performed  urodynamics and documented no stress urinary incontinence on urodynamics  __________ more strictly urgency incontinence other than a significant  cystocele with a very low leak point pressure and urgency.  He further  recommended that she go ahead and proceed with anterior posterior  colporrhaphy and if the symptoms return then he can perform a SPARC  procedure at a later time. The risks and benefits were discussed at length  which include but are not limited to risk of infection, bleeding, damage to  bowel, bladder, ureters,  risk associated with anesthesia or bleeding. She  also understands this may not alleviate the urinary incontinence or even  worsen the urinary incontinence requiring another procedure. This may also  not be a permanent procedure and may recur in the future. She does give her  informed consent and wishes to proceed.   DESCRIPTION OF PROCEDURE:  After adequate analgesia, the patient was placed  in the dorsal lithotomy position, she was sterilely prepped and draped. The  bladder was sterilely drained.  A weighted speculum was placed, the apex of  the vagina was identified, grasped with an Allis clamp, midline incision was  made undermining the anterior vaginal mucosa and reflected it laterally up  until approximately 1 1/2 cm from the urethral meatus.  This was retracted  laterally, cystocele was reduced, plicated in the pubocervical fascia in the  midline using figure-of-eights of #0 Monocryl suture with good approximation  and good hemostasis noted.  A Kelly plication stitch was placed at the  urethrovesical angle. A second imbricating suture through the pubovesical  cervical fascia was also used with good approximation and support noted. The  excess vaginal mucosa was excised  and anterior vaginal mucosa was closed  with 2-0 Monocryl in a running fashion.  The hymenal ring was grasped with  two Allis clamps, a triangular flap across the peritoneal body was created  and the posterior vaginal mucosa was undermined with Metzenbaum scissors and  reflected laterally.  The vagina had been shortened to the point that we  were unable to reach the sacrospinous ligament from the apex of the vagina.  The vagina was reflected laterally and the rectal fascia was then plicated  in the midline reducing the rectocele with good approximation of the rectal  fascia with figure-of-eights of #0 Monocryl suture and the superficial  transverse perinei muscles and the bulbocavernosus muscles were also  plicated in  the midline with the perineal body.  The excess vaginal mucosa  was then excised and the posterior vagina was closed with a running suture  of 2-0 Monocryl in a running locking fashion with good approximation and  good hemostasis. The vagina was then packed with gauze containing estrogen,  Foley catheter placed with return of clear yellow urine. The rectovaginal  revealed good rectal support and no sutures into the rectum. The patient was  stable on transfer to the recovery room. The sponge and instrument count was  normal x3.  The patient did receive 1 g of Rocephin preoperatively.  Estimated blood loss during the procedure was less than 50 mL.      DCL/MEDQ  D:  03/28/2004  T:  03/28/2004  Job:  [id number]
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
## 2010-11-21 ENCOUNTER — Emergency Department (HOSPITAL_COMMUNITY): Payer: Medicare Other

## 2010-11-21 ENCOUNTER — Emergency Department (HOSPITAL_COMMUNITY)
Admission: EM | Admit: 2010-11-21 | Discharge: 2010-11-21 | Disposition: A | Discharge status: Home / Self Care | Payer: Medicare Other | Attending: Emergency Medicine | Admitting: Emergency Medicine

## 2010-11-21 ENCOUNTER — Encounter (HOSPITAL_COMMUNITY): Payer: Self-pay | Admitting: Radiology

## 2010-11-21 DIAGNOSIS — Z8679 Personal history of other diseases of the circulatory system: Secondary | ICD-10-CM | POA: Insufficient documentation

## 2010-11-21 DIAGNOSIS — G319 Degenerative disease of nervous system, unspecified: Secondary | ICD-10-CM | POA: Insufficient documentation

## 2010-11-21 DIAGNOSIS — I2510 Atherosclerotic heart disease of native coronary artery without angina pectoris: Secondary | ICD-10-CM | POA: Insufficient documentation

## 2010-11-21 DIAGNOSIS — W010XXA Fall on same level from slipping, tripping and stumbling without subsequent striking against object, initial encounter: Secondary | ICD-10-CM | POA: Insufficient documentation

## 2010-11-21 DIAGNOSIS — I10 Essential (primary) hypertension: Secondary | ICD-10-CM | POA: Insufficient documentation

## 2010-11-21 DIAGNOSIS — I4891 Unspecified atrial fibrillation: Secondary | ICD-10-CM | POA: Insufficient documentation

## 2010-11-21 DIAGNOSIS — S0003XA Contusion of scalp, initial encounter: Secondary | ICD-10-CM | POA: Insufficient documentation

## 2010-11-21 DIAGNOSIS — Y92009 Unspecified place in unspecified non-institutional (private) residence as the place of occurrence of the external cause: Secondary | ICD-10-CM | POA: Insufficient documentation

## 2010-11-21 DIAGNOSIS — I509 Heart failure, unspecified: Secondary | ICD-10-CM | POA: Insufficient documentation

## 2010-11-21 DIAGNOSIS — Z950 Presence of cardiac pacemaker: Secondary | ICD-10-CM | POA: Insufficient documentation

## 2010-11-21 DIAGNOSIS — Z951 Presence of aortocoronary bypass graft: Secondary | ICD-10-CM | POA: Insufficient documentation

## 2010-11-21 DIAGNOSIS — IMO0002 Reserved for concepts with insufficient information to code with codable children: Secondary | ICD-10-CM | POA: Insufficient documentation

## 2010-11-21 DIAGNOSIS — S1093XA Contusion of unspecified part of neck, initial encounter: Secondary | ICD-10-CM | POA: Insufficient documentation

## 2010-11-21 HISTORY — DX: Heart failure, unspecified: I50.9

## 2010-11-21 HISTORY — DX: Essential (primary) hypertension: I10

## 2010-11-21 HISTORY — DX: Cerebral infarction, unspecified: I63.9

## 2010-11-21 HISTORY — DX: Atherosclerotic heart disease of native coronary artery without angina pectoris: I25.10

## 2010-12-09 ENCOUNTER — Ambulatory Visit (HOSPITAL_COMMUNITY): Payer: Medicare Other | Attending: Internal Medicine

## 2010-12-09 DIAGNOSIS — M810 Age-related osteoporosis without current pathological fracture: Secondary | ICD-10-CM | POA: Insufficient documentation

## 2011-02-02 ENCOUNTER — Ambulatory Visit (INDEPENDENT_AMBULATORY_CARE_PROVIDER_SITE_OTHER): Payer: Medicare Other | Admitting: *Deleted

## 2011-02-02 ENCOUNTER — Other Ambulatory Visit: Payer: Self-pay

## 2011-02-02 DIAGNOSIS — I4891 Unspecified atrial fibrillation: Secondary | ICD-10-CM

## 2011-02-02 DIAGNOSIS — I495 Sick sinus syndrome: Secondary | ICD-10-CM

## 2011-02-07 LAB — REMOTE PACEMAKER DEVICE
AL AMPLITUDE: 2.3 mv
AL IMPEDENCE PM: 408 Ohm
ATRIAL PACING PM: 99.96
BATTERY VOLTAGE: 3 V
RV LEAD IMPEDENCE PM: 592 Ohm
VENTRICULAR PACING PM: 0

## 2011-02-08 NOTE — Progress Notes (Signed)
Pacer remote check  

## 2011-02-16 ENCOUNTER — Encounter: Payer: Self-pay | Admitting: *Deleted

## 2011-02-16 ENCOUNTER — Other Ambulatory Visit: Payer: Self-pay | Admitting: Internal Medicine

## 2011-02-16 ENCOUNTER — Other Ambulatory Visit (HOSPITAL_COMMUNITY): Payer: Self-pay | Admitting: Internal Medicine

## 2011-02-16 ENCOUNTER — Ambulatory Visit
Admission: RE | Admit: 2011-02-16 | Discharge: 2011-02-16 | Disposition: A | Discharge status: Home / Self Care | Payer: Medicare Other | Source: Ambulatory Visit | Attending: Internal Medicine | Admitting: Internal Medicine

## 2011-02-16 DIAGNOSIS — R1310 Dysphagia, unspecified: Secondary | ICD-10-CM

## 2011-02-16 DIAGNOSIS — M549 Dorsalgia, unspecified: Secondary | ICD-10-CM

## 2011-02-21 ENCOUNTER — Encounter: Payer: Self-pay | Admitting: *Deleted

## 2011-02-21 ENCOUNTER — Ambulatory Visit (HOSPITAL_COMMUNITY)
Admission: RE | Admit: 2011-02-21 | Discharge: 2011-02-21 | Disposition: A | Discharge status: Home / Self Care | Payer: Medicare Other | Source: Ambulatory Visit | Attending: Internal Medicine | Admitting: Internal Medicine

## 2011-02-21 DIAGNOSIS — R1310 Dysphagia, unspecified: Secondary | ICD-10-CM | POA: Insufficient documentation

## 2011-02-22 ENCOUNTER — Ambulatory Visit (INDEPENDENT_AMBULATORY_CARE_PROVIDER_SITE_OTHER): Payer: Medicare Other | Admitting: Cardiovascular Disease

## 2011-02-22 ENCOUNTER — Encounter: Payer: Self-pay | Admitting: Cardiovascular Disease

## 2011-02-22 VITALS — BP 158/80 | HR 70 | Ht 64.0 in | Wt 233.4 lb

## 2011-02-22 DIAGNOSIS — I4891 Unspecified atrial fibrillation: Secondary | ICD-10-CM

## 2011-02-22 DIAGNOSIS — R609 Edema, unspecified: Secondary | ICD-10-CM

## 2011-02-22 DIAGNOSIS — Z950 Presence of cardiac pacemaker: Secondary | ICD-10-CM

## 2011-02-22 DIAGNOSIS — R600 Localized edema: Secondary | ICD-10-CM

## 2011-02-22 DIAGNOSIS — I2510 Atherosclerotic heart disease of native coronary artery without angina pectoris: Secondary | ICD-10-CM

## 2011-02-22 NOTE — Assessment & Plan Note (Signed)
He has a pacemaker. We'll see her in device clinic.

## 2011-02-22 NOTE — Assessment & Plan Note (Signed)
She has leg edema that I think is due to venous insufficiency. This is most likely because of her relative inactivity. I've asked her to try to walk as much as possible and to elevate her legs above her head.

## 2011-02-22 NOTE — Assessment & Plan Note (Signed)
She had bypass surgery several years ago. She is having occasional episodes of atypical chest pain. Her EKG remains unchanged. I do not think that her current episodes of chest pain are related to coronary artery disease.

## 2011-02-22 NOTE — Progress Notes (Signed)
[name] Date of Birth  11/20/1929 [facility] / [facility] [address]     [address] [place], [place]  [address] [phone]  Fax  [phone]  History of Present Illness:  77 year old female with a history of coronary artery disease and coronary artery bypass in 2010.   [name] also had a Maze procedure. She has history of hypertension and  hypothyroidism. [name] had a subdural hematoma it is still somewhat limited by that. She still has some issues with her speech. She has good days and bad days.  She has occasional episodes of chest discomfort.  She still seems a little bit confused at times. Her sister admits that it's really not clear whether she's having any chest pain or not.  Current Outpatient Prescriptions on File Prior to Visit  Medication Sig Dispense Refill  . amiodarone (PACERONE) 200 MG tablet One tablet Monday, Wednesday and Friday.  30 tablet  9  . Ascorbic Acid (VITAMIN C PO) Take 1 tablet by mouth daily.        Marland Kitchen aspirin 81 MG tablet Take 81 mg by mouth daily.        Marland Kitchen BIOTIN PO Take 1,000 mcg by mouth daily.       . calcium-vitamin D (OSCAL WITH D) 500-200 MG-UNIT per tablet Take by mouth daily. Taking 600 mg      . clopidogrel (PLAVIX) 75 MG tablet Take 75 mg by mouth daily.        . ergocalciferol (VITAMIN D2) 50000 UNITS capsule Take 50,000 Units by mouth once a week.        . escitalopram (LEXAPRO) 10 MG tablet Take 10 mg by mouth daily.       Marland Kitchen levothyroxine (SYNTHROID, LEVOTHROID) 125 MCG tablet Take 150 mcg by mouth daily.       . multivitamin (THERAGRAN) per tablet Take 1 tablet by mouth daily.        . pantoprazole (PROTONIX) 40 MG tablet Take 40 mg by mouth daily.        . propranolol (INDERAL) 10 MG tablet Take 10 mg by mouth as needed.        . ranolazine (RANEXA) 500 MG 12 hr tablet Take 500 mg by mouth 2 (two) times daily.        . zoledronic acid (RECLAST) 5 MG/100ML SOLN Inject 5 mg into the vein. Yearly       .  PACERONE 200 MG tablet TAKE ONE TABLET BY MOUTH ON MONDAY, WEDNESDAY AND FRIDAY        No Known Allergies  Past Medical History  Diagnosis Date  . CHF (congestive heart failure)   . Hypertension   . CVA (cerebral vascular accident)   . Hypothyroidism   . COPD (chronic obstructive pulmonary disease)   . Sick sinus syndrome     status post pacemaker implantation  . Paroxysmal atrial fibrillation   . Subendocardial myocardial infarction   . Subdural hematoma   . CAD (coronary artery disease)     post PTCA and stenting of the LAD and Status post maze procedure  . History of gastrointestinal bleeding   . GERD (gastroesophageal reflux disease)   . Osteoporosis     Past Surgical History  Procedure Date  . Coronary artery bypass graft 06/2009  . Cardiac catheterization 08/17/2009    Est. EF is 35% to 40%  . Insert / replace / remove pacemaker     for sick sinus syndrome  History  Smoking status  . Never Smoker   Smokeless tobacco  . Never Used    History  Alcohol Use No    Family History  Problem Relation Age of Onset  . Lung cancer Father   . Heart attack Mother   . Coronary artery disease Mother   . Coronary artery disease Brother     with CABG  . Heart attack Brother   . Heart attack Brother   . Lung cancer Brother   . Coronary artery disease Sister   . Hypertension Mother   . Hypertension Brother   . Hypertension Brother     Reviw of Systems:  Reviewed in the HPI.  All other systems are negative.  Physical Exam: BP 158/80  Pulse 70  Ht 5\' 4"  (1.626 m)  Wt 233 lb 6.4 oz (105.87 kg)  BMI 40.06 kg/m2 The patient is alert and oriented x 3.  The mood and affect are normal.   Skin: warm and dry.  Color is normal.    HEENT:   the sclera are nonicteric.  The mucous membranes are moist.  The carotids are 2+ without bruits.  There is no thyromegaly.  There is no JVD.    Lungs: clear.  The chest wall is non tender.    Heart: regular rate with a normal S1  and S2.  There are no murmurs, gallops, or rubs. The PMI is not displaced.     Abdomen: good bowel sounds.  There is no guarding or rebound.  There is no hepatosplenomegaly or tenderness.  There are no masses.   Extremities:  no clubbing, cyanosis, or edema.  The legs are without rashes.  The distal pulses are intact.   Neuro:  Cranial nerves II - XII are intact.  Motor and sensory functions are intact.    The gait is normal.  ECG: AV pacing  Assessment / Plan:

## 2011-02-23 ENCOUNTER — Encounter: Payer: Self-pay | Admitting: Cardiovascular Disease

## 2011-03-27 LAB — BASIC METABOLIC PANEL
BUN: 24 — ABNORMAL HIGH
CO2: 25
Calcium: 9.1
Creatinine, Ser: 1
GFR calc Af Amer: >60
Glucose, Bld: 77

## 2011-03-30 LAB — BASIC METABOLIC PANEL
BUN: 20
CO2: 25
Calcium: 9
Creatinine, Ser: 1.02
GFR calc Af Amer: >60
Glucose, Bld: 91

## 2011-03-30 LAB — DIFFERENTIAL
Basophils Absolute: 0
Basophils Relative: 1
Eosinophils Absolute: 0.1
Eosinophils Relative: 3
Lymphocytes Relative: 20
Lymphs Abs: 1.2
Monocytes Absolute: 0.5
Monocytes Relative: 8
Neutro Abs: 4.2
Neutrophils Relative %: 69

## 2011-03-30 LAB — URINALYSIS, ROUTINE W REFLEX MICROSCOPIC
Bilirubin Urine: NEGATIVE
Glucose, UA: NEGATIVE
Hgb urine dipstick: NEGATIVE
Ketones, ur: NEGATIVE
Nitrite: NEGATIVE
Protein, ur: NEGATIVE
Specific Gravity, Urine: 1.017
Urobilinogen, UA: 0.2
pH: 7.5

## 2011-03-30 LAB — COMPREHENSIVE METABOLIC PANEL
AST: 19
Albumin: 4.2
Alkaline Phosphatase: 77
Chloride: 104
GFR calc Af Amer: >60
Potassium: 4
Total Bilirubin: 0.8
Total Protein: 7.1

## 2011-03-30 LAB — COMPREHENSIVE METABOLIC PANEL WITH GFR
ALT: 16
BUN: 26 — ABNORMAL HIGH
CO2: 24
Calcium: 9.3
Creatinine, Ser: 0.93
GFR calc non Af Amer: 59 — ABNORMAL LOW
Glucose, Bld: 107 — ABNORMAL HIGH
Sodium: 139

## 2011-03-30 LAB — POCT CARDIAC MARKERS
CKMB, poc: 1.4
CKMB, poc: 1.6
CKMB, poc: 1.7
Myoglobin, poc: 59.3
Myoglobin, poc: 62.3
Myoglobin, poc: 78.4
Operator id: 285491
Operator id: 285491
Operator id: 285491
Troponin i, poc: <0.05
Troponin i, poc: <0.05
Troponin i, poc: <0.05

## 2011-03-30 LAB — CBC
HCT: 37.9
Hemoglobin: 13
MCHC: 34.7
MCHC: 34.2
MCV: 87.8
Platelets: 201
Platelets: 186
RBC: 4.32
RDW: 14
RDW: 13.6
WBC: 6

## 2011-03-30 LAB — URINE CULTURE: Colony Count: >=100000

## 2011-03-30 LAB — B-NATRIURETIC PEPTIDE (CONVERTED LAB): Pro B Natriuretic peptide (BNP): 252 — ABNORMAL HIGH

## 2011-03-30 LAB — URINE MICROSCOPIC-ADD ON

## 2011-04-11 LAB — DIFFERENTIAL
Eosinophils Absolute: 0.3
Eosinophils Relative: 3
Lymphocytes Relative: 13
Lymphocytes Relative: 14
Lymphs Abs: 1.1
Lymphs Abs: 1.2
Monocytes Relative: 11
Monocytes Relative: 7
Neutro Abs: 5.5
Neutrophils Relative %: 69

## 2011-04-11 LAB — COMPREHENSIVE METABOLIC PANEL
ALT: 12
AST: 22
CO2: 27
Calcium: 9
Creatinine, Ser: 1.18
GFR calc Af Amer: 54 — ABNORMAL LOW
GFR calc non Af Amer: 45 — ABNORMAL LOW
Sodium: 138
Total Protein: 6.5

## 2011-04-11 LAB — PROTIME-INR
INR: 1.1
Prothrombin Time: 14.4

## 2011-04-11 LAB — CBC
MCHC: 34.3
MCV: 83.6
Platelets: 196
Platelets: 201
RBC: 3.93
RBC: 4
RDW: 13.6
WBC: 7.9

## 2011-04-11 LAB — TSH: TSH: 1.626

## 2011-04-11 LAB — BASIC METABOLIC PANEL
BUN: 23
Creatinine, Ser: 1.01
GFR calc Af Amer: >60
GFR calc non Af Amer: 53 — ABNORMAL LOW
Potassium: 3.8

## 2011-04-11 LAB — VITAMIN D 25 HYDROXY (VIT D DEFICIENCY, FRACTURES): Vit D, 25-Hydroxy: 39 (ref 30–89)

## 2011-04-18 LAB — BASIC METABOLIC PANEL
CO2: 26
Calcium: 8.9
Chloride: 103
GFR calc Af Amer: 51 — ABNORMAL LOW
Potassium: 3.6
Sodium: 140

## 2011-04-18 LAB — CBC
HCT: 31.4 — ABNORMAL LOW
Hemoglobin: 10.6 — ABNORMAL LOW
Platelets: 202
RBC: 3.68 — ABNORMAL LOW
WBC: 7.7

## 2011-04-19 LAB — I-STAT 8, (EC8 V) (CONVERTED LAB)
Acid-Base Excess: 1
Chloride: 107
Glucose, Bld: 109 — ABNORMAL HIGH
Hemoglobin: 11.6 — ABNORMAL LOW
Potassium: 3.9
Sodium: 139
TCO2: 25

## 2011-04-19 LAB — CBC
HCT: 31.9 — ABNORMAL LOW
Hemoglobin: 11.2 — ABNORMAL LOW
MCHC: 33.7
MCHC: 34.2
MCV: 83.7
MCV: 85.6
Platelets: 205
RBC: 4.01
RDW: 14.1 — ABNORMAL HIGH
RDW: 14.5 — ABNORMAL HIGH
WBC: 8.6

## 2011-04-19 LAB — BASIC METABOLIC PANEL
BUN: 31 — ABNORMAL HIGH
BUN: 37 — ABNORMAL HIGH
CO2: 25
Chloride: 105
Chloride: 105
Creatinine, Ser: 1.08
GFR calc Af Amer: 60 — ABNORMAL LOW
Glucose, Bld: 104 — ABNORMAL HIGH
Potassium: 3.6

## 2011-04-19 LAB — POCT CARDIAC MARKERS
CKMB, poc: 1.4
Troponin i, poc: <0.05

## 2011-04-19 LAB — APTT: aPTT: 27

## 2011-04-19 LAB — CK TOTAL AND CKMB (NOT AT ARMC)
CK, MB: 1.6
Total CK: 90

## 2011-04-19 LAB — CARDIAC PANEL(CRET KIN+CKTOT+MB+TROPI): Troponin I: 0.13 — ABNORMAL HIGH

## 2011-04-19 LAB — LIPID PANEL
LDL Cholesterol: 234 — ABNORMAL HIGH
Triglycerides: 276 — ABNORMAL HIGH

## 2011-04-25 ENCOUNTER — Telehealth: Payer: Self-pay | Admitting: Internal Medicine

## 2011-04-28 ENCOUNTER — Encounter: Payer: Self-pay | Admitting: Internal Medicine

## 2011-04-28 ENCOUNTER — Ambulatory Visit (INDEPENDENT_AMBULATORY_CARE_PROVIDER_SITE_OTHER): Payer: Medicare Other | Admitting: Internal Medicine

## 2011-04-28 VITALS — BP 132/70 | HR 80 | Resp 18

## 2011-04-28 DIAGNOSIS — I495 Sick sinus syndrome: Secondary | ICD-10-CM

## 2011-04-28 DIAGNOSIS — I498 Other specified cardiac arrhythmias: Secondary | ICD-10-CM

## 2011-04-28 DIAGNOSIS — I2510 Atherosclerotic heart disease of native coronary artery without angina pectoris: Secondary | ICD-10-CM

## 2011-04-28 DIAGNOSIS — I4891 Unspecified atrial fibrillation: Secondary | ICD-10-CM

## 2011-04-28 LAB — PACEMAKER DEVICE OBSERVATION
AL AMPLITUDE: 2.4541 mv
ATRIAL PACING PM: 99.96
BAMS-0001: 170 {beats}/min
BATTERY VOLTAGE: 2.99 V
VENTRICULAR PACING PM: 0

## 2011-04-28 NOTE — Assessment & Plan Note (Signed)
Stable No change required today  

## 2011-04-28 NOTE — Progress Notes (Signed)
[name] is a pleasant 77 y.o. patient with a h/o bradycardia sp PPM (MDT) by Dr Reyes Ivan who presents today to establish care in the Electrophysiology device clinic.  The patient reports doing very well since having a pacemaker implanted.  She is not very active due to instability and DJD.  She has had several falls and now walks with a walker.   Today, she  denies symptoms of palpitations, chest pain, shortness of breath, orthopnea, PND, lower extremity edema, dizziness, presyncope, syncope, or neurologic sequela.  The patientis tolerating medications without difficulties and is otherwise without complaint today.   Past Medical History  Diagnosis Date  . CHF (congestive heart failure)   . Hypertension   . CVA (cerebral vascular accident)   . Hypothyroidism   . COPD (chronic obstructive pulmonary disease)   . Sick sinus syndrome     status post pacemaker implantation  . Paroxysmal atrial fibrillation   . Subendocardial myocardial infarction   . Subdural hematoma   . CAD (coronary artery disease)     post PTCA and stenting of the LAD and Status post maze procedure  . History of gastrointestinal bleeding   . GERD (gastroesophageal reflux disease)   . Osteoporosis     Past Surgical History  Procedure Date  . Coronary artery bypass graft 06/2009  . Cardiac catheterization 08/17/2009    Est. EF is 35% to 40%  . Pacemaker insertion     MDT implanted by Dr Reyes Ivan for SSS    History   Social History  . Marital Status: Married    Spouse Name: N/A    Number of Children: N/A  . Years of Education: N/A   Occupational History  . Not on file.   Social History Main Topics  . Smoking status: Never Smoker   . Smokeless tobacco: Never Used  . Alcohol Use: No  . Drug Use: No  . Sexually Active:    Other Topics Concern  . Not on file   Social History Narrative  . No narrative on file    Family History  Problem Relation Age of Onset  . Lung cancer Father   . Heart attack  Mother   . Coronary artery disease Mother   . Coronary artery disease Brother     with CABG  . Heart attack Brother   . Heart attack Brother   . Lung cancer Brother   . Coronary artery disease Sister   . Hypertension Mother   . Hypertension Brother   . Hypertension Brother     No Known Allergies  Current Outpatient Prescriptions  Medication Sig Dispense Refill  . Ascorbic Acid (VITAMIN C PO) Take 1 tablet by mouth daily.        Marland Kitchen aspirin 81 MG tablet Take 81 mg by mouth daily.        Marland Kitchen BIOTIN PO Take 1,000 mcg by mouth daily.       . Calcium Carbonate-Vitamin D (CALCIUM + D PO) Take 600 mg by mouth. Oscal Ultra       . calcium-vitamin D (OSCAL WITH D) 500-200 MG-UNIT per tablet Take by mouth daily. Taking 600 mg      . clopidogrel (PLAVIX) 75 MG tablet Take 75 mg by mouth daily.        . ergocalciferol (VITAMIN D2) 50000 UNITS capsule Take 50,000 Units by mouth once a week.        . escitalopram (LEXAPRO) 10 MG tablet Take 10 mg by mouth daily.       Marland Kitchen  levothyroxine (SYNTHROID, LEVOTHROID) 125 MCG tablet Take 150 mcg by mouth daily.       . metoprolol succinate (TOPROL-XL) 25 MG 24 hr tablet Take 25 mg by mouth daily.        . multivitamin (THERAGRAN) per tablet Take 1 tablet by mouth daily.        Marland Kitchen PACERONE 200 MG tablet TAKE ONE TABLET BY MOUTH ON MONDAY, WEDNESDAY AND FRIDAY      . pantoprazole (PROTONIX) 40 MG tablet Take 40 mg by mouth daily.        Marland Kitchen POTASSIUM PO Take 99 mg by mouth.        . propranolol (INDERAL) 10 MG tablet Take 10 mg by mouth as needed.        . zoledronic acid (RECLAST) 5 MG/100ML SOLN Inject 5 mg into the vein. Yearly       . amiodarone (PACERONE) 200 MG tablet One tablet Monday, Wednesday and Friday.  30 tablet  9  . ranolazine (RANEXA) 500 MG 12 hr tablet Take 500 mg by mouth 2 (two) times daily.          ROS- all systems are reviewed and negative except as per HPI  Physical Exam: Filed Vitals:   04/28/11 1214  BP: 132/70  Pulse: 80  Resp:  18    GEN- The patient is obese and elderly appearing, alert and oriented x 3 today.   Head- normocephalic, atraumatic Eyes-  Sclera clear, conjunctiva pink Ears- hearing intact Oropharynx- clear Neck- supple, no JVP Lymph- no cervical lymphadenopathy Lungs- Clear to ausculation bilaterally, normal work of breathing Chest- pacemaker pocket is well healed Heart- Regular rate and rhythm, no murmurs, rubs or gallops, PMI not laterally displaced GI- soft, NT, ND, + BS Extremities- no clubbing, cyanosis, or edema MS- walks slowly with a walker Skin- no rash or lesion Psych- euthymic mood, full affect Neuro- strength and sensation are intact  Pacemaker interrogation- reviewed in detail today,  See PACEART report  Assessment and Plan:

## 2011-04-28 NOTE — Assessment & Plan Note (Addendum)
Pacemaker interrogation reveals no episodes of afib over the past year. Given her multiple stroke risk factors, she will require coumadin if her afib burden increases in the future.  Continue low dose amiodarone LFTs/TFTs twice yearly by primary care.

## 2011-04-28 NOTE — Patient Instructions (Addendum)
Your physician wants you to follow-up in: 12 months. You will receive a reminder letter in the mail two months in advance. If you don't receive a letter, please call our office to schedule the follow-up appointment.  Pacer check every 3 months. Next carelink check is July 27, 2011

## 2011-04-28 NOTE — Assessment & Plan Note (Signed)
Normal pacemaker function See Pace Art report No changes today  

## 2011-06-14 ENCOUNTER — Other Ambulatory Visit: Payer: Self-pay | Admitting: Internal Medicine

## 2011-06-14 ENCOUNTER — Ambulatory Visit
Admission: RE | Admit: 2011-06-14 | Discharge: 2011-06-14 | Disposition: A | Discharge status: Home / Self Care | Payer: Medicare Other | Source: Ambulatory Visit | Attending: Internal Medicine | Admitting: Internal Medicine

## 2011-06-20 ENCOUNTER — Other Ambulatory Visit: Payer: Self-pay | Admitting: Internal Medicine

## 2011-06-20 DIAGNOSIS — R918 Other nonspecific abnormal finding of lung field: Secondary | ICD-10-CM

## 2011-06-21 ENCOUNTER — Ambulatory Visit
Admission: RE | Admit: 2011-06-21 | Discharge: 2011-06-21 | Disposition: A | Discharge status: Home / Self Care | Payer: Medicare Other | Source: Ambulatory Visit | Attending: Internal Medicine | Admitting: Internal Medicine

## 2011-06-21 DIAGNOSIS — R918 Other nonspecific abnormal finding of lung field: Secondary | ICD-10-CM

## 2011-07-27 ENCOUNTER — Encounter: Payer: Medicare Other | Admitting: *Deleted

## 2011-07-31 ENCOUNTER — Encounter: Payer: Self-pay | Admitting: *Deleted

## 2011-08-05 ENCOUNTER — Encounter: Payer: Self-pay | Admitting: Internal Medicine

## 2011-08-07 ENCOUNTER — Ambulatory Visit (INDEPENDENT_AMBULATORY_CARE_PROVIDER_SITE_OTHER): Payer: Medicare Other | Admitting: *Deleted

## 2011-08-07 DIAGNOSIS — I4891 Unspecified atrial fibrillation: Secondary | ICD-10-CM

## 2011-08-07 DIAGNOSIS — Z950 Presence of cardiac pacemaker: Secondary | ICD-10-CM

## 2011-08-11 LAB — REMOTE PACEMAKER DEVICE
AL IMPEDENCE PM: 408 Ohm
ATRIAL PACING PM: 99.86
BAMS-0001: 170 {beats}/min

## 2011-08-17 NOTE — Progress Notes (Signed)
Remote pacer check  

## 2011-08-21 ENCOUNTER — Encounter: Payer: Self-pay | Admitting: Cardiovascular Disease

## 2011-08-21 ENCOUNTER — Ambulatory Visit (INDEPENDENT_AMBULATORY_CARE_PROVIDER_SITE_OTHER): Payer: Medicare Other | Admitting: Cardiovascular Disease

## 2011-08-21 VITALS — BP 140/80 | HR 73 | Resp 16 | Ht 67.0 in | Wt 234.8 lb

## 2011-08-21 DIAGNOSIS — E785 Hyperlipidemia, unspecified: Secondary | ICD-10-CM

## 2011-08-21 DIAGNOSIS — I2510 Atherosclerotic heart disease of native coronary artery without angina pectoris: Secondary | ICD-10-CM

## 2011-08-21 NOTE — Assessment & Plan Note (Signed)
She still eats a fair amount of extra salt - bacon and eggs in the morning for breakfast, syringes for lunch, but ankles and other factors her dinner. I've cautioned her about eating any exercise that she does have some mild leg edema. She was not aware that she was eating such a high salt diet.  See her again in 6 months for followup office visit.

## 2011-08-21 NOTE — Patient Instructions (Signed)
Your physician wants you to follow-up in: 6 months.  You will receive a reminder letter in the mail two months in advance. If you don't receive a letter, please call our office to schedule the follow-up appointment.  Your physician recommends that you return for fasting lab work in: 6 months, day of your next appt (lipid, bmet, liver)

## 2011-08-21 NOTE — Assessment & Plan Note (Signed)
She has not had any episodes of angina. We'll continue with her same medications.

## 2011-08-21 NOTE — Progress Notes (Signed)
[name] Date of Birth  Nov 14, 1929 [facility]     [place] [facility]  [address]    [address]   [address] [place], [place]  [address]    [place], [place]  [address] [phone]  Fax  [phone]  [phone]  Fax [phone]  Problem list: 1. Coronary artery disease-status post PTCA and stenting of the LAD 2. Status post CABG and Maze procedure, December 2010 B. Congestive heart failure 4. Hypertension 5. Hypothyroidism  6. Sick sinus syndrome-status post pacemaker implantation 7. History of atrial fibrillation 8. COPD 9. Subdural hematoma   History of Present Illness:  Patient is doing well today.  She complains of poor eye sight this am.  She has continued to have dyspnea.  She does not exercise anymore due to the high risk for falling.  She fell again this week and hit the back of her head.  She continues to be very unsteady with walking.  She is still eating a fairly high salt diet ([employer]angles, [employer], eggs and bacon every morning)  Current Outpatient Prescriptions on File Prior to Visit  Medication Sig Dispense Refill  . amiodarone (PACERONE) 200 MG tablet One tablet Monday, Wednesday and Friday.  30 tablet  9  . Ascorbic Acid (VITAMIN C PO) Take 1 tablet by mouth daily.        Marland Kitchen aspirin 81 MG tablet Take 81 mg by mouth daily.        Marland Kitchen BIOTIN PO Take 1,000 mcg by mouth daily.       . calcium-vitamin D (OSCAL WITH D) 500-200 MG-UNIT per tablet Take by mouth daily. Taking 600 mg      . clopidogrel (PLAVIX) 75 MG tablet Take 75 mg by mouth daily.        . ergocalciferol (VITAMIN D2) 50000 UNITS capsule Take 50,000 Units by mouth once a week.        . escitalopram (LEXAPRO) 10 MG tablet Take 5 mg by mouth daily.       Marland Kitchen levothyroxine (SYNTHROID, LEVOTHROID) 125 MCG tablet Take 150 mcg by mouth daily.       . metoprolol succinate (TOPROL-XL) 25 MG 24 hr tablet Take 25 mg by mouth daily.        . multivitamin (THERAGRAN) per tablet Take 1  tablet by mouth daily.        . pantoprazole (PROTONIX) 40 MG tablet Take 40 mg by mouth daily.        . ranolazine (RANEXA) 500 MG 12 hr tablet Take 500 mg by mouth 2 (two) times daily.        . zoledronic acid (RECLAST) 5 MG/100ML SOLN Inject 5 mg into the vein. Yearly       . Calcium Carbonate-Vitamin D (CALCIUM + D PO) Take 600 mg by mouth. Oscal Ultra       . PACERONE 200 MG tablet TAKE ONE TABLET BY MOUTH ON MONDAY, WEDNESDAY AND FRIDAY      . POTASSIUM PO Take 99 mg by mouth.        . propranolol (INDERAL) 10 MG tablet Take 10 mg by mouth as needed.          No Known Allergies  Past Medical History  Diagnosis Date  . CHF (congestive heart failure)   . Hypertension   . CVA (cerebral vascular accident)   . Hypothyroidism   . COPD (chronic obstructive pulmonary disease)   . Sick sinus syndrome  status post pacemaker implantation  . Paroxysmal atrial fibrillation   . Subendocardial myocardial infarction   . Subdural hematoma   . CAD (coronary artery disease)     post PTCA and stenting of the LAD and Status post maze procedure  . History of gastrointestinal bleeding   . GERD (gastroesophageal reflux disease)   . Osteoporosis     Past Surgical History  Procedure Date  . Coronary artery bypass graft 06/2009  . Cardiac catheterization 08/17/2009    Est. EF is 35% to 40%  . Pacemaker insertion     MDT implanted by Dr Reyes Ivan for SSS    History  Smoking status  . Never Smoker   Smokeless tobacco  . Never Used    History  Alcohol Use No    Family History  Problem Relation Age of Onset  . Lung cancer Father   . Heart attack Mother   . Coronary artery disease Mother   . Coronary artery disease Brother     with CABG  . Heart attack Brother   . Heart attack Brother   . Lung cancer Brother   . Coronary artery disease Sister   . Hypertension Mother   . Hypertension Brother   . Hypertension Brother     Reviw of Systems:  Reviewed in the HPI.  All other  systems are negative.  Physical Exam: Blood pressure 179/85, pulse 73, resp. rate 16, height 5\' 7"  (1.702 m), weight 234 lb 12 oz (106.482 kg). General: Well developed, well nourished, in no acute distress.  Head: Normocephalic, atraumatic, sclera non-icteric, mucus membranes are moist,   Neck: Supple. Negative for carotid bruits. JVD not elevated.  Lungs: Clear bilaterally to auscultation without wheezes, rales, or rhonchi. Breathing is unlabored.  Heart: RRR with S1 S2. No murmurs, rubs, or gallops appreciated.  Abdomen: Soft, non-tender, non-distended with normoactive bowel sounds. No hepatomegaly. No rebound/guarding. No obvious abdominal masses.  Msk:  Strength and tone appear normal for age.  Extremities: No clubbing or cyanosis. 1-2+ pitting edema.  Distal pedal pulses are 2+ and equal bilaterally.  Neuro: Alert and oriented X 3. Moves all extremities spontaneously.  Psych:  Responds to questions appropriately with a normal affect.  She has made a significant recovery in her speech.  ECG:  Assessment / Plan:

## 2011-08-22 ENCOUNTER — Encounter: Payer: Self-pay | Admitting: *Deleted

## 2011-09-27 ENCOUNTER — Encounter: Payer: Self-pay | Admitting: Internal Medicine

## 2011-09-30 ENCOUNTER — Other Ambulatory Visit: Payer: Self-pay | Admitting: Cardiovascular Disease

## 2011-10-02 NOTE — Telephone Encounter (Signed)
Fax Received. Refill Completed. [name] (R.M.A)   

## 2011-10-25 NOTE — Telephone Encounter (Signed)
Close  

## 2011-11-09 ENCOUNTER — Ambulatory Visit (INDEPENDENT_AMBULATORY_CARE_PROVIDER_SITE_OTHER): Payer: Medicare Other | Admitting: *Deleted

## 2011-11-09 ENCOUNTER — Encounter: Payer: Self-pay | Admitting: Internal Medicine

## 2011-11-09 DIAGNOSIS — I495 Sick sinus syndrome: Secondary | ICD-10-CM

## 2011-11-15 LAB — REMOTE PACEMAKER DEVICE
AL AMPLITUDE: 2.2 mv
BAMS-0001: 170 {beats}/min
RV LEAD AMPLITUDE: 5.9 mv
RV LEAD IMPEDENCE PM: 512 Ohm

## 2011-11-20 ENCOUNTER — Encounter: Payer: Self-pay | Admitting: *Deleted

## 2011-11-22 NOTE — Progress Notes (Signed)
PPM remote 

## 2012-01-05 ENCOUNTER — Other Ambulatory Visit: Payer: Self-pay | Admitting: Cardiovascular Disease

## 2012-01-05 NOTE — Telephone Encounter (Signed)
Fax Received. Refill Completed. [name] (R.M.A)   

## 2012-01-27 ENCOUNTER — Inpatient Hospital Stay (HOSPITAL_COMMUNITY)
Admission: EM | Admit: 2012-01-27 | Discharge: 2012-01-28 | DRG: 603 | Disposition: A | Discharge status: Home / Self Care | Payer: Medicare Other | Attending: Internal Medicine | Admitting: Internal Medicine

## 2012-01-27 ENCOUNTER — Encounter (HOSPITAL_COMMUNITY): Payer: Self-pay | Admitting: *Deleted

## 2012-01-27 DIAGNOSIS — M79609 Pain in unspecified limb: Secondary | ICD-10-CM

## 2012-01-27 DIAGNOSIS — I2510 Atherosclerotic heart disease of native coronary artery without angina pectoris: Secondary | ICD-10-CM | POA: Diagnosis present

## 2012-01-27 DIAGNOSIS — I5022 Chronic systolic (congestive) heart failure: Secondary | ICD-10-CM

## 2012-01-27 DIAGNOSIS — I4891 Unspecified atrial fibrillation: Secondary | ICD-10-CM | POA: Diagnosis present

## 2012-01-27 DIAGNOSIS — R600 Localized edema: Secondary | ICD-10-CM

## 2012-01-27 DIAGNOSIS — Z951 Presence of aortocoronary bypass graft: Secondary | ICD-10-CM

## 2012-01-27 DIAGNOSIS — L0390 Cellulitis, unspecified: Secondary | ICD-10-CM

## 2012-01-27 DIAGNOSIS — M810 Age-related osteoporosis without current pathological fracture: Secondary | ICD-10-CM | POA: Diagnosis present

## 2012-01-27 DIAGNOSIS — I495 Sick sinus syndrome: Secondary | ICD-10-CM

## 2012-01-27 DIAGNOSIS — I509 Heart failure, unspecified: Secondary | ICD-10-CM | POA: Diagnosis present

## 2012-01-27 DIAGNOSIS — L02419 Cutaneous abscess of limb, unspecified: Principal | ICD-10-CM | POA: Diagnosis present

## 2012-01-27 DIAGNOSIS — I10 Essential (primary) hypertension: Secondary | ICD-10-CM | POA: Diagnosis present

## 2012-01-27 DIAGNOSIS — Z7982 Long term (current) use of aspirin: Secondary | ICD-10-CM

## 2012-01-27 DIAGNOSIS — I129 Hypertensive chronic kidney disease with stage 1 through stage 4 chronic kidney disease, or unspecified chronic kidney disease: Secondary | ICD-10-CM | POA: Diagnosis present

## 2012-01-27 DIAGNOSIS — E785 Hyperlipidemia, unspecified: Secondary | ICD-10-CM

## 2012-01-27 DIAGNOSIS — K219 Gastro-esophageal reflux disease without esophagitis: Secondary | ICD-10-CM

## 2012-01-27 DIAGNOSIS — E039 Hypothyroidism, unspecified: Secondary | ICD-10-CM | POA: Diagnosis present

## 2012-01-27 DIAGNOSIS — Z950 Presence of cardiac pacemaker: Secondary | ICD-10-CM

## 2012-01-27 DIAGNOSIS — L03116 Cellulitis of left lower limb: Secondary | ICD-10-CM | POA: Diagnosis present

## 2012-01-27 DIAGNOSIS — N189 Chronic kidney disease, unspecified: Secondary | ICD-10-CM | POA: Diagnosis present

## 2012-01-27 DIAGNOSIS — L03119 Cellulitis of unspecified part of limb: Principal | ICD-10-CM | POA: Diagnosis present

## 2012-01-27 DIAGNOSIS — Z79899 Other long term (current) drug therapy: Secondary | ICD-10-CM

## 2012-01-27 LAB — BASIC METABOLIC PANEL
Calcium: 10.4 mg/dL (ref 8.4–10.5)
GFR calc Af Amer: 46 mL/min — ABNORMAL LOW (ref >90)
GFR calc non Af Amer: 40 mL/min — ABNORMAL LOW (ref >90)
Potassium: 3.7 mEq/L (ref 3.5–5.1)
Sodium: 139 mEq/L (ref 135–145)

## 2012-01-27 LAB — CREATININE, SERUM
GFR calc Af Amer: 46 mL/min — ABNORMAL LOW (ref >90)
GFR calc non Af Amer: 40 mL/min — ABNORMAL LOW (ref >90)

## 2012-01-27 LAB — CBC
HCT: 33.1 % — ABNORMAL LOW (ref 36.0–46.0)
Hemoglobin: 11 g/dL — ABNORMAL LOW (ref 12.0–15.0)
MCHC: 33.2 g/dL (ref 30.0–36.0)
MCV: 89.9 fL (ref 78.0–100.0)
RDW: 14.2 % (ref 11.5–15.5)

## 2012-01-27 LAB — CBC WITH DIFFERENTIAL/PLATELET
Basophils Absolute: 0 10*3/uL (ref 0.0–0.1)
Eosinophils Relative: 2 % (ref 0–5)
Lymphocytes Relative: 18 % (ref 12–46)
MCV: 90.2 fL (ref 78.0–100.0)
Neutro Abs: 5.6 10*3/uL (ref 1.7–7.7)
Neutrophils Relative %: 72 % (ref 43–77)
Platelets: 227 10*3/uL (ref 150–400)
RBC: 4.28 MIL/uL (ref 3.87–5.11)
RDW: 14.3 % (ref 11.5–15.5)
WBC: 7.8 10*3/uL (ref 4.0–10.5)

## 2012-01-27 MED ORDER — ESCITALOPRAM OXALATE 5 MG PO TABS
5.0000 mg | ORAL_TABLET | Freq: Every day | ORAL | Status: DC
  Administered 2012-01-28: 5 mg via ORAL
  Filled 2012-01-27: qty 1

## 2012-01-27 MED ORDER — VANCOMYCIN HCL 1000 MG IV SOLR
1500.0000 mg | INTRAVENOUS | Status: DC
  Administered 2012-01-27: 1500 mg via INTRAVENOUS
  Filled 2012-01-27 (×2): qty 1500

## 2012-01-27 MED ORDER — VITAMIN C 500 MG PO TABS
500.0000 mg | ORAL_TABLET | Freq: Every day | ORAL | Status: DC
  Administered 2012-01-28: 500 mg via ORAL
  Filled 2012-01-27: qty 1

## 2012-01-27 MED ORDER — ACETAMINOPHEN 325 MG PO TABS: 650.0000 mg | ORAL_TABLET | Freq: Four times a day (QID) | ORAL | Status: DC | PRN

## 2012-01-27 MED ORDER — AMIODARONE HCL 200 MG PO TABS: 200.0000 mg | ORAL_TABLET | ORAL | Status: DC

## 2012-01-27 MED ORDER — SODIUM CHLORIDE 0.9 % IV SOLN
Freq: Once | INTRAVENOUS | Status: AC
  Administered 2012-01-27: 17:00:00 via INTRAVENOUS

## 2012-01-27 MED ORDER — ACETAMINOPHEN 650 MG RE SUPP: 650.0000 mg | Freq: Four times a day (QID) | RECTAL | Status: DC | PRN

## 2012-01-27 MED ORDER — SODIUM CHLORIDE 0.9 % IJ SOLN
3.0000 mL | Freq: Two times a day (BID) | INTRAMUSCULAR | Status: DC
  Administered 2012-01-27: 3 mL via INTRAVENOUS

## 2012-01-27 MED ORDER — LEVOTHYROXINE SODIUM 150 MCG PO TABS: 150.0000 ug | ORAL_TABLET | Freq: Every day | ORAL | Status: DC

## 2012-01-27 MED ORDER — HEPARIN SODIUM (PORCINE) 5000 UNIT/ML IJ SOLN
5000.0000 [IU] | Freq: Three times a day (TID) | INTRAMUSCULAR | Status: DC
  Administered 2012-01-27 – 2012-01-28 (×3): 5000 [IU] via SUBCUTANEOUS
  Filled 2012-01-27 (×5): qty 1

## 2012-01-27 MED ORDER — ADULT MULTIVITAMIN W/MINERALS CH
1.0000 | ORAL_TABLET | Freq: Every day | ORAL | Status: DC
  Administered 2012-01-28: 1 via ORAL
  Filled 2012-01-27: qty 1

## 2012-01-27 MED ORDER — METOPROLOL SUCCINATE ER 25 MG PO TB24
25.0000 mg | ORAL_TABLET | Freq: Every day | ORAL | Status: DC
  Administered 2012-01-28: 25 mg via ORAL
  Filled 2012-01-27: qty 1

## 2012-01-27 MED ORDER — LEVOTHYROXINE SODIUM 150 MCG PO TABS
150.0000 ug | ORAL_TABLET | ORAL | Status: DC
  Administered 2012-01-28: 150 ug via ORAL
  Filled 2012-01-27 (×2): qty 1

## 2012-01-27 MED ORDER — LEVOTHYROXINE SODIUM 25 MCG PO TABS: 225.0000 ug | ORAL_TABLET | ORAL | Status: DC

## 2012-01-27 MED ORDER — ONDANSETRON HCL 4 MG/2ML IJ SOLN: 4.0000 mg | Freq: Four times a day (QID) | INTRAMUSCULAR | Status: DC | PRN

## 2012-01-27 MED ORDER — HYDROCODONE-ACETAMINOPHEN 5-325 MG PO TABS: 1.0000 | ORAL_TABLET | ORAL | Status: DC | PRN

## 2012-01-27 MED ORDER — CLOPIDOGREL BISULFATE 75 MG PO TABS
75.0000 mg | ORAL_TABLET | Freq: Every day | ORAL | Status: DC
  Administered 2012-01-28: 75 mg via ORAL
  Filled 2012-01-27: qty 1

## 2012-01-27 MED ORDER — PANTOPRAZOLE SODIUM 40 MG PO TBEC
40.0000 mg | DELAYED_RELEASE_TABLET | Freq: Every day | ORAL | Status: DC
  Administered 2012-01-28: 40 mg via ORAL
  Filled 2012-01-27: qty 1

## 2012-01-27 MED ORDER — FUROSEMIDE 20 MG PO TABS
20.0000 mg | ORAL_TABLET | Freq: Every day | ORAL | Status: DC
  Administered 2012-01-28: 20 mg via ORAL
  Filled 2012-01-27: qty 1

## 2012-01-27 MED ORDER — VANCOMYCIN HCL IN DEXTROSE 1-5 GM/200ML-% IV SOLN
1000.0000 mg | Freq: Once | INTRAVENOUS | Status: AC
  Administered 2012-01-27: 1000 mg via INTRAVENOUS
  Filled 2012-01-27: qty 200

## 2012-01-27 MED ORDER — LEVOTHYROXINE SODIUM 25 MCG PO TABS: 225.0000 ug | ORAL_TABLET | Freq: Every day | ORAL | Status: DC

## 2012-01-27 MED ORDER — POLYETHYLENE GLYCOL 3350 17 G PO PACK
17.0000 g | PACK | Freq: Every day | ORAL | Status: DC | PRN
  Filled 2012-01-27: qty 1

## 2012-01-27 MED ORDER — POTASSIUM CHLORIDE CRYS ER 10 MEQ PO TBCR
10.0000 meq | EXTENDED_RELEASE_TABLET | Freq: Every day | ORAL | Status: DC
  Administered 2012-01-28: 10 meq via ORAL
  Filled 2012-01-27: qty 1

## 2012-01-27 MED ORDER — ASPIRIN EC 81 MG PO TBEC
81.0000 mg | DELAYED_RELEASE_TABLET | Freq: Every day | ORAL | Status: DC
  Administered 2012-01-27 – 2012-01-28 (×2): 81 mg via ORAL
  Filled 2012-01-27 (×2): qty 1

## 2012-01-27 MED ORDER — ONDANSETRON HCL 4 MG PO TABS: 4.0000 mg | ORAL_TABLET | Freq: Four times a day (QID) | ORAL | Status: DC | PRN

## 2012-01-27 MED ORDER — SODIUM CHLORIDE 0.9 % IV SOLN
250.0000 mL | INTRAVENOUS | Status: DC | PRN
  Administered 2012-01-28: 250 mL via INTRAVENOUS

## 2012-01-27 MED ORDER — ACETAMINOPHEN 325 MG PO TABS
650.0000 mg | ORAL_TABLET | Freq: Once | ORAL | Status: AC
  Administered 2012-01-27: 650 mg via ORAL
  Filled 2012-01-27: qty 2

## 2012-01-27 MED ORDER — SODIUM CHLORIDE 0.9 % IJ SOLN: 3.0000 mL | INTRAMUSCULAR | Status: DC | PRN

## 2012-01-27 MED ORDER — RANOLAZINE ER 500 MG PO TB12
500.0000 mg | ORAL_TABLET | Freq: Two times a day (BID) | ORAL | Status: DC
  Administered 2012-01-27 – 2012-01-28 (×2): 500 mg via ORAL
  Filled 2012-01-27 (×3): qty 1

## 2012-01-27 MED ORDER — NITROGLYCERIN 0.4 MG SL SUBL: 0.4000 mg | SUBLINGUAL_TABLET | SUBLINGUAL | Status: DC | PRN

## 2012-01-27 NOTE — ED Notes (Signed)
Ultrasound at bedside

## 2012-01-27 NOTE — ED Provider Notes (Signed)
Pt seen with PA  Pt has cellulitis to left LE, IV vancomycin ordered and will need admission BP 175/74  Pulse 70  Temp 97.8 F (36.6 C) (Oral)  Resp 18  SpO2 97%   Joya Gaskins, MD 01/27/12 1642

## 2012-01-27 NOTE — Progress Notes (Signed)
VASCULAR LAB PRELIMINARY  PRELIMINARY  PRELIMINARY  PRELIMINARY  Left lower extremity venous Doppler completed.    Preliminary report:  There is no DVT or SVT noted in the left lower extremity.  [name], 01/27/2012, 5:30 PM

## 2012-01-27 NOTE — H&P (Signed)
[facility] History and Physical  [name]:[id number] DOB: 1929/11/12 DOA: 01/27/2012   PCP: Ezequiel Kayser, MD   Chief Complaint: Left great toe pain with redness on leg  HPI:  This is an 78 year old female with past medical history of coronary arteries last and Duffy Rhody last year treated with percutaneous intraluminal coronary angioplasty back in 2009, sick sinus syndrome status post pacemaker on October 2012, paroxysmal A. fib on amiodarone hypothyroidism, chronic renal failure with a baseline creatinine of 1.2, angina on Ranexa, also history of systolic heart failure with an EF of 30% by last echo on January 2011 and a history of CABG back in December 2012 that comes in for left great toe pain and redness extending up her leg prior 4 days prior to admission. Progressively getting worse to the point where she can even applied weight to that foot as she has significant pain. She relates no history of gout. Exquisitely tender. She has had no cuts or sores. Had a recent tremor of her toenails about 3 weeks ago. She relates no fevers at home no nausea or vomiting or diarrhea. She had a Doppler ultrasound here in the ED that was negative for DVT.  Review of Systems:  Constitutional:  No weight loss, night sweats, Fevers, chills, fatigue.  HEENT:  No headaches, Difficulty swallowing,Tooth/dental problems,Sore throat,  No sneezing, itching, ear ache, nasal congestion, post nasal drip,  Cardio-vascular:  No chest pain, Orthopnea, PND, swelling in lower extremities, anasarca, dizziness, palpitations  GI:  No heartburn, indigestion, abdominal pain, nausea, vomiting, diarrhea, change in bowel habits, loss of appetite  Resp:  No shortness of breath with exertion or at rest. No excess mucus, no productive cough, No non-productive cough, No coughing up of blood.No change in color of mucus.No wheezing.No chest wall deformity  Skin:  no rash or lesions.  GU:  no dysuria, change in color  of urine, no urgency or frequency. No flank pain.    Psych:  No change in mood or affect. No depression or anxiety. No memory loss.    Past Medical History  Diagnosis Date  . CHF (congestive heart failure)   . Hypertension   . CVA (cerebral vascular accident)   . Hypothyroidism   . COPD (chronic obstructive pulmonary disease)   . Sick sinus syndrome     status post pacemaker implantation  . Paroxysmal atrial fibrillation   . Subendocardial myocardial infarction   . Subdural hematoma   . CAD (coronary artery disease)     post PTCA and stenting of the LAD and Status post maze procedure  . History of gastrointestinal bleeding   . GERD (gastroesophageal reflux disease)   . Osteoporosis    Past Surgical History  Procedure Date  . Coronary artery bypass graft 06/2009  . Cardiac catheterization 08/17/2009    Est. EF is 35% to 40%  . Pacemaker insertion     MDT implanted by Dr Reyes Ivan for SSS   Social History:  reports that she has never smoked. She has never used smokeless tobacco. She reports that she does not drink alcohol or use illicit drugs.  Allergies  Allergen Reactions  . Codeine     unknown  . Statins     unknown  . Sulfa Antibiotics     unknown    Family History  Problem Relation Age of Onset  . Lung cancer Father   . Heart attack Mother   . Coronary artery disease Mother   . Coronary artery disease Brother  with CABG  . Heart attack Brother   . Heart attack Brother   . Lung cancer Brother   . Coronary artery disease Sister   . Hypertension Mother   . Hypertension Brother   . Hypertension Brother     Prior to Admission medications   Medication Sig Start Date End Date Taking? Authorizing Provider  amiodarone (PACERONE) 200 MG tablet Take 200 mg by mouth every Monday, Wednesday, and Friday.   Yes Historical Provider, MD  aspirin EC 81 MG tablet Take 81 mg by mouth daily.   Yes Historical Provider, MD  clopidogrel (PLAVIX) 75 MG tablet Take 75 mg by  mouth daily.     Yes Historical Provider, MD  escitalopram (LEXAPRO) 10 MG tablet Take 5 mg by mouth daily.    Yes Historical Provider, MD  furosemide (LASIX) 20 MG tablet Take 20 mg by mouth daily.   Yes Historical Provider, MD  levothyroxine (SYNTHROID, LEVOTHROID) 150 MCG tablet Take 150 mcg by mouth daily. Sunday-friday   Yes Historical Provider, MD  levothyroxine (SYNTHROID, LEVOTHROID) 150 MCG tablet Take 225 mcg by mouth daily. saturday   Yes Historical Provider, MD  metoprolol succinate (TOPROL-XL) 25 MG 24 hr tablet Take 25 mg by mouth daily.     Yes Historical Provider, MD  Multiple Vitamin (MULTIVITAMIN WITH MINERALS) TABS Take 1 tablet by mouth daily.   Yes Historical Provider, MD  OVER THE COUNTER MEDICATION Take 1 tablet by mouth daily. oscal ultra 600+D   Yes Historical Provider, MD  pantoprazole (PROTONIX) 40 MG tablet Take 40 mg by mouth daily.     Yes Historical Provider, MD  potassium chloride (K-DUR,KLOR-CON) 10 MEQ tablet Take 10 mEq by mouth daily.   Yes Historical Provider, MD  ranolazine (RANEXA) 500 MG 12 hr tablet Take 500 mg by mouth 2 (two) times daily.   Yes Historical Provider, MD  Teriparatide, Recombinant, (FORTEO [place]) Inject 20 mcg into the skin every evening.   Yes Historical Provider, MD  vitamin C (ASCORBIC ACID) 500 MG tablet Take 500 mg by mouth daily.   Yes Historical Provider, MD  nitroGLYCERIN (NITROSTAT) 0.4 MG SL tablet Place 0.4 mg under the tongue every 5 (five) minutes as needed. For chest pain    Historical Provider, MD  zoledronic acid (RECLAST) 5 MG/100ML SOLN Inject 5 mg into the vein. Yearly     Historical Provider, MD   Physical Exam: Filed Vitals:   01/27/12 1238 01/27/12 1629 01/27/12 1746  BP: 165/65 175/74 167/71  Pulse: 78 70 70  Temp: 98.9 F (37.2 C) 97.8 F (36.6 C) 98.2 F (36.8 C)  TempSrc: Oral Oral Oral  Resp: 17 18 19   SpO2: 100% 97% 100%   BP 167/71  Pulse 70  Temp 98.2 F (36.8 C) (Oral)  Resp 19  SpO2 100%  General  Appearance:    Alert, cooperative, no distress, appears stated age  Head:    Normocephalic, without obvious abnormality, atraumatic  Eyes:    PERRL, conjunctiva/corneas clear, EOM's intact, fundi    benign, both eyes        Throat:   Lips, mucosa, and tongue normal;  Neck:   Supple, symmetrical, trachea midline, no adenopathy;    thyroid:  no enlargement/tenderness/nodules; no carotid   bruit or JVD  Back:     Symmetric, no curvature, ROM normal, no CVA tenderness  Lungs:     Clear to auscultation bilaterally, respirations unlabored  Chest Wall:    No tenderness or deformity  Heart:    Regular rate and rhythm, S1 and S2 normal, no murmur, rub   or gallop     Abdomen:     Soft, non-tender, bowel sounds active all four quadrants,    no masses, no organomegaly        Extremities:   her left lower tremor and he is to come for initially bigger than the right she has redness extending from her great toe almost up to her knee the great toe is significantly erythematous and exquisitely tender to touch as well as her skin. Is also warm.  Pulses:   2+ and symmetric all extremities  Skin:   Skin color, texture, turgor normal, no rashes or lesions  Lymph nodes:   Cervical, supraclavicular, and axillary nodes normal  Neurologic:   CNII-XII intact, normal strength, sensation and reflexes    throughout    Labs on Admission:  Basic Metabolic Panel:  Lab 01/27/12 4782  NA 139  K 3.7  CL 98  CO2 28  GLUCOSE 121*  BUN 25*  CREATININE 1.25*  CALCIUM 10.4  MG --  PHOS --   Liver Function Tests: No results found for this basename: AST:5,ALT:5,ALKPHOS:5,BILITOT:5,PROT:5,ALBUMIN:5 in the last 168 hours No results found for this basename: LIPASE:5,AMYLASE:5 in the last 168 hours No results found for this basename: AMMONIA:5 in the last 168 hours CBC:  Lab 01/27/12 1606  WBC 7.8  NEUTROABS 5.6  HGB 12.9  HCT 38.6  MCV 90.2  PLT 227   Cardiac Enzymes: No results found for this  basename: CKTOTAL:5,CKMB:5,CKMBINDEX:5,TROPONINI:5 in the last 168 hours BNP: No components found with this basename: POCBNP:5 CBG: No results found for this basename: GLUCAP:5 in the last 168 hours  Radiological Exams on Admission: No results found.  EKG: Independently reviewed. None  Assessment/Plan: Principal Problem:  *Cellulitis of left lower leg: -We'll go ahead and made it to regular floor, started on IV vancomycin. I have demarcated the area, also get physical therapy to evaluate her. We'll get blood cultures x2. And we'll reevaluate in the morning. I would start with Tylenol for pain control. She does have an unknown allergy to codeine. We'll use Norco for breakthrough pain.  Chronic systolic heart failure with an ejection fraction of 30%: -Currently seems compensated, no JVD or edema in her right lower extremity. We'll continue current medications. Minimize IV fluids.   Unspecified hypothyroidism: Continue TSH.   HYPERTENSION: -Blood pressure is mildly high, she is in significant pain, which could be contributing to her elevated blood pressure. At this time we'll try to control her pain. Continue her home meds and reevaluate in the morning.  CAD: -Continue aspirin and Plavix currently asymptomatic.  Sick sinus syndrome/Pacemaker: Regular rate and rhythm continue amiodarone.   Time spend: Greater than 35 minutes Family Communication: Patient and daughter [phone] Disposition Plan: Home  Marinda Elk, MD  [facility] Pager [phone]  If 7PM-7AM, please contact night-coverage [email or web address] Password [facility] 01/27/2012, 5:56 PM

## 2012-01-27 NOTE — ED Provider Notes (Signed)
History     CSN: [id number]  Arrival date & time 01/27/12  1218   First MD Initiated Contact with Patient 01/27/12 1609      Chief Complaint  Patient presents with  . Foot Pain    (Consider location/radiation/quality/duration/timing/severity/associated sxs/prior treatment) Patient is a 78 y.o. female presenting with lower extremity pain.  Foot Pain This is a new problem. The current episode started in the past 7 days. The problem occurs constantly. The problem has been gradually worsening. Pertinent negatives include no abdominal pain, chest pain or fever. Associated symptoms comments: Left foot pain, swelling and redness worsening over the last 3-4 days. No known injury, other than caregiver giving history of pedicure about a week ago. Redness and soreness have moved from foot into lower extremity. No fever, nausea or vomiting. She was seen by her orthopedist, Dr. Charlett Blake, today and sent here for further evaluation. .    Past Medical History  Diagnosis Date  . CHF (congestive heart failure)   . Hypertension   . CVA (cerebral vascular accident)   . Hypothyroidism   . COPD (chronic obstructive pulmonary disease)   . Sick sinus syndrome     status post pacemaker implantation  . Paroxysmal atrial fibrillation   . Subendocardial myocardial infarction   . Subdural hematoma   . CAD (coronary artery disease)     post PTCA and stenting of the LAD and Status post maze procedure  . History of gastrointestinal bleeding   . GERD (gastroesophageal reflux disease)   . Osteoporosis     Past Surgical History  Procedure Date  . Coronary artery bypass graft 06/2009  . Cardiac catheterization 08/17/2009    Est. EF is 35% to 40%  . Pacemaker insertion     MDT implanted by Dr Reyes Ivan for SSS    Family History  Problem Relation Age of Onset  . Lung cancer Father   . Heart attack Mother   . Coronary artery disease Mother   . Coronary artery disease Brother     with CABG  . Heart attack  Brother   . Heart attack Brother   . Lung cancer Brother   . Coronary artery disease Sister   . Hypertension Mother   . Hypertension Brother   . Hypertension Brother     History  Substance Use Topics  . Smoking status: Never Smoker   . Smokeless tobacco: Never Used  . Alcohol Use: No    OB History    Grav Para Term Preterm Abortions TAB SAB Ect Mult Living                  Review of Systems  Constitutional: Negative for fever.  Respiratory: Negative for shortness of breath.   Cardiovascular: Positive for leg swelling. Negative for chest pain.  Gastrointestinal: Negative for abdominal pain.  Musculoskeletal:       See HPI.  Skin: Positive for color change.    Allergies  Codeine; Statins; and Sulfa antibiotics  Home Medications   Current Outpatient Rx  Name Route Sig Dispense Refill  . AMIODARONE HCL 200 MG PO TABS Oral Take 200 mg by mouth every Monday, Wednesday, and Friday.    . ASPIRIN EC 81 MG PO TBEC Oral Take 81 mg by mouth daily.    Marland Kitchen CLOPIDOGREL BISULFATE 75 MG PO TABS Oral Take 75 mg by mouth daily.      Marland Kitchen ESCITALOPRAM OXALATE 10 MG PO TABS Oral Take 5 mg by mouth daily.     Marland Kitchen  FUROSEMIDE 20 MG PO TABS Oral Take 20 mg by mouth daily.    Marland Kitchen LEVOTHYROXINE SODIUM 150 MCG PO TABS Oral Take 150 mcg by mouth daily. Sunday-friday    . LEVOTHYROXINE SODIUM 150 MCG PO TABS Oral Take 225 mcg by mouth daily. saturday    . METOPROLOL SUCCINATE ER 25 MG PO TB24 Oral Take 25 mg by mouth daily.      . ADULT MULTIVITAMIN W/MINERALS CH Oral Take 1 tablet by mouth daily.    Marland Kitchen OVER THE COUNTER MEDICATION Oral Take 1 tablet by mouth daily. oscal ultra 600+D    . PANTOPRAZOLE SODIUM 40 MG PO TBEC Oral Take 40 mg by mouth daily.      Marland Kitchen POTASSIUM CHLORIDE CRYS ER 10 MEQ PO TBCR Oral Take 10 mEq by mouth daily.    Marland Kitchen RANOLAZINE ER 500 MG PO TB12 Oral Take 500 mg by mouth 2 (two) times daily.    Marland Kitchen FORTEO Bison Subcutaneous Inject 20 mcg into the skin every evening.    Marland Kitchen VITAMIN C 500 MG  PO TABS Oral Take 500 mg by mouth daily.    Marland Kitchen NITROGLYCERIN 0.4 MG SL SUBL Sublingual Place 0.4 mg under the tongue every 5 (five) minutes as needed. For chest pain    . ZOLEDRONIC ACID 5 MG/100ML IV SOLN Intravenous Inject 5 mg into the vein. Yearly       BP 175/74  Pulse 70  Temp 97.8 F (36.6 C) (Oral)  Resp 18  SpO2 97%  Physical Exam  Constitutional: She is oriented to person, place, and time. She appears well-developed and well-nourished. No distress.  Neck: Normal range of motion.  Cardiovascular: Normal rate and regular rhythm.   No murmur heard. Pulmonary/Chest: Effort normal. She has no wheezes. She has no rales.  Abdominal: Soft. There is no tenderness. There is no rebound and no guarding.  Musculoskeletal:       Left foot has marked swelling to great toe without lesion, drainage or lesion. Nail intact. No localized paronychia. Redness and less swelling extend into forefoot and circumferential lower extremity with generalized tenderness. Full range of motion of all joints.   Neurological: She is alert and oriented to person, place, and time. No cranial nerve deficit.  Skin: Skin is warm. There is erythema.    ED Course  Procedures (including critical care time)   Labs Reviewed  CBC WITH DIFFERENTIAL  BASIC METABOLIC PANEL   No results found.  Results for orders placed during the hospital encounter of 01/27/12  CBC WITH DIFFERENTIAL      Component Value Range   WBC 7.8  4.0 - 10.5 K/uL   RBC 4.28  3.87 - 5.11 MIL/uL   Hemoglobin 12.9  12.0 - 15.0 g/dL   HCT 16.1  09.6 - 04.5 %   MCV 90.2  78.0 - 100.0 fL   MCH 30.1  26.0 - 34.0 pg   MCHC 33.4  30.0 - 36.0 g/dL   RDW 40.9  81.1 - 91.4 %   Platelets 227  150 - 400 K/uL   Neutrophils Relative 72  43 - 77 %   Neutro Abs 5.6  1.7 - 7.7 K/uL   Lymphocytes Relative 18  12 - 46 %   Lymphs Abs 1.4  0.7 - 4.0 K/uL   Monocytes Relative 8  3 - 12 %   Monocytes Absolute 0.6  0.1 - 1.0 K/uL   Eosinophils Relative 2  0  - 5 %   Eosinophils  Absolute 0.1  0.0 - 0.7 K/uL   Basophils Relative 1  0 - 1 %   Basophils Absolute 0.0  0.0 - 0.1 K/uL  BASIC METABOLIC PANEL      Component Value Range   Sodium 139  135 - 145 mEq/L   Potassium 3.7  3.5 - 5.1 mEq/L   Chloride 98  96 - 112 mEq/L   CO2 28  19 - 32 mEq/L   Glucose, Bld 121 (*) 70 - 99 mg/dL   BUN 25 (*) 6 - 23 mg/dL   Creatinine, Ser 1.61 (*) 0.50 - 1.10 mg/dL   Calcium 09.6  8.4 - 04.5 mg/dL   GFR calc non Af Amer 40 (*) >90 mL/min   GFR calc Af Amer 46 (*) >90 mL/min  CBC      Component Value Range   WBC 6.7  4.0 - 10.5 K/uL   RBC 3.68 (*) 3.87 - 5.11 MIL/uL   Hemoglobin 11.0 (*) 12.0 - 15.0 g/dL   HCT 40.9 (*) 81.1 - 91.4 %   MCV 89.9  78.0 - 100.0 fL   MCH 29.9  26.0 - 34.0 pg   MCHC 33.2  30.0 - 36.0 g/dL   RDW 78.2  95.6 - 21.3 %   Platelets 194  150 - 400 K/uL  CREATININE, SERUM      Component Value Range   Creatinine, Ser 1.25 (*) 0.50 - 1.10 mg/dL   GFR calc non Af Amer 40 (*) >90 mL/min   GFR calc Af Amer 46 (*) >90 mL/min  COMPREHENSIVE METABOLIC PANEL      Component Value Range   Sodium 142  135 - 145 mEq/L   Potassium 3.3 (*) 3.5 - 5.1 mEq/L   Chloride 104  96 - 112 mEq/L   CO2 26  19 - 32 mEq/L   Glucose, Bld 102 (*) 70 - 99 mg/dL   BUN 21  6 - 23 mg/dL   Creatinine, Ser 0.86 (*) 0.50 - 1.10 mg/dL   Calcium 8.8  8.4 - 57.8 mg/dL   Total Protein 6.6  6.0 - 8.3 g/dL   Albumin 3.4 (*) 3.5 - 5.2 g/dL   AST 11  0 - 37 U/L   ALT 10  0 - 35 U/L   Alkaline Phosphatase 92  39 - 117 U/L   Total Bilirubin 0.5  0.3 - 1.2 mg/dL   GFR calc non Af Amer 42 (*) >90 mL/min   GFR calc Af Amer 49 (*) >90 mL/min   No results found.  No diagnosis found.  1. Cellulitis left foot 2. R/o DVT   MDM  She is evaluated by Dr. Bebe Shaggy. Favor cellulitis over DVT - antibiotics ordered, doppler ordered and is pending. Admission with [facility] arranged as patient is advanced age, debilitated condition and it is felt she is a poor candidate for  outpatient treatment.         Rodena Medin, PA-C 01/28/12 1437

## 2012-01-27 NOTE — ED Notes (Addendum)
Pt reports several day hx of left foot pain, on exam pt with redness and swelling to left big toe. Pt states her doctor told her to come to the ED. Pt with reddening noted up to mid calf.

## 2012-01-27 NOTE — ED Notes (Signed)
Redness and swelling to both feet, ankles and moving up towards knees, left side is worse than right side

## 2012-01-27 NOTE — Progress Notes (Signed)
ANTIBIOTIC CONSULT NOTE - INITIAL  Pharmacy Consult for Vanco Indication: Left great toe pain with redness up leg  Allergies  Allergen Reactions  . Codeine     unknown  . Statins     unknown  . Sulfa Antibiotics     unknown    Patient Measurements: Height: 5\' 7"  (170.2 cm) Weight: 231 lb 0.7 oz (104.8 kg) IBW/kg (Calculated) : 61.6  Adjusted Body Weight: 75 kg  Vital Signs: Temp: 98.3 F (36.8 C) (07/27 2008) Temp src: Oral (07/27 2008) BP: 131/60 mmHg (07/27 2008) Pulse Rate: 70  (07/27 2008) Intake/Output from previous day:   Intake/Output from this shift:    Labs:  [facility] 01/27/12 1606  WBC 7.8  HGB 12.9  PLT 227  LABCREA --  CREATININE 1.25*   Estimated Creatinine Clearance: 44.7 ml/min (by C-G formula based on Cr of 1.25). No results found for this basename: VANCOTROUGH:2,VANCOPEAK:2,VANCORANDOM:2,GENTTROUGH:2,GENTPEAK:2,GENTRANDOM:2,TOBRATROUGH:2,TOBRAPEAK:2,TOBRARND:2,AMIKACINPEAK:2,AMIKACINTROU:2,AMIKACIN:2, in the last 72 hours   Microbiology: No results found for this or any previous visit (from the past 720 hour(s)).  Medical History: Past Medical History  Diagnosis Date  . CHF (congestive heart failure)   . Hypertension   . CVA (cerebral vascular accident)   . Hypothyroidism   . COPD (chronic obstructive pulmonary disease)   . Sick sinus syndrome     status post pacemaker implantation  . Paroxysmal atrial fibrillation   . Subendocardial myocardial infarction   . Subdural hematoma   . CAD (coronary artery disease)     post PTCA and stenting of the LAD and Status post maze procedure  . History of gastrointestinal bleeding   . GERD (gastroesophageal reflux disease)   . Osteoporosis     Medications:  Prescriptions prior to admission  Medication Sig Dispense Refill  . amiodarone (PACERONE) 200 MG tablet Take 200 mg by mouth every Monday, Wednesday, and Friday.      Marland Kitchen aspirin EC 81 MG tablet Take 81 mg by mouth daily.      . clopidogrel  (PLAVIX) 75 MG tablet Take 75 mg by mouth daily.        Marland Kitchen escitalopram (LEXAPRO) 10 MG tablet Take 5 mg by mouth daily.       . furosemide (LASIX) 20 MG tablet Take 20 mg by mouth daily.      Marland Kitchen levothyroxine (SYNTHROID, LEVOTHROID) 150 MCG tablet Take 150 mcg by mouth daily. Sunday-friday      . levothyroxine (SYNTHROID, LEVOTHROID) 150 MCG tablet Take 225 mcg by mouth daily. saturday      . metoprolol succinate (TOPROL-XL) 25 MG 24 hr tablet Take 25 mg by mouth daily.        . Multiple Vitamin (MULTIVITAMIN WITH MINERALS) TABS Take 1 tablet by mouth daily.      Marland Kitchen OVER THE COUNTER MEDICATION Take 1 tablet by mouth daily. oscal ultra 600+D      . pantoprazole (PROTONIX) 40 MG tablet Take 40 mg by mouth daily.        . potassium chloride (K-DUR,KLOR-CON) 10 MEQ tablet Take 10 mEq by mouth daily.      . ranolazine (RANEXA) 500 MG 12 hr tablet Take 500 mg by mouth 2 (two) times daily.      . Teriparatide, Recombinant, (FORTEO [place]) Inject 20 mcg into the skin every evening.      . vitamin C (ASCORBIC ACID) 500 MG tablet Take 500 mg by mouth daily.      . nitroGLYCERIN (NITROSTAT) 0.4 MG SL tablet Place 0.4 mg under  the tongue every 5 (five) minutes as needed. For chest pain      . zoledronic acid (RECLAST) 5 MG/100ML SOLN Inject 5 mg into the vein. Yearly        Assessment: Great toe pain with redness extending up leg x 4d. Patient can no longer apply weight to foot. Does not have h/o gout. Dopplers negative for DVT. Treat with abx for cellulitis and check blook cultures. Scr 1.25. WBC 7.8   Goal of Therapy:  Vancomycin trough level 10-15 mcg/ml  Plan:  Vancomycin 1500mg  IV q24h. Trough after 3-5 doses at steady state.  Merilynn Finland, [employer] 01/27/2012,8:38 PM

## 2012-01-27 NOTE — ED Notes (Signed)
EDP was asked about obtaining blood cultures before vancomycin was started but he denied the need to order and obtain.

## 2012-01-27 NOTE — ED Notes (Signed)
Spoke with Vascular Tech Candice. She will complete testing within the next 30 mins. SA

## 2012-01-27 NOTE — ED Notes (Signed)
MD at bedside. 

## 2012-01-27 NOTE — Progress Notes (Signed)
[name] [id number] Admission Data: 01/27/2012 7:47 PM Attending Provider: Marinda Elk, MD  Madeline A, MD Consults/ Treatment Team:    [name] is Mendez 78 y.o. female patient admitted from ED awake, alert  & orientated  X 3,  No Order, VSS - Blood pressure 167/71, pulse 70, temperature 98.2 F (36.8 C), temperature source Oral, resp. rate 19, SpO2 100.00%.,no c/o shortness of breath, no c/o chest pain, no distress noted. IIV site WDL:  wrist right, condition patent and no redness with Mendez transparent dsg that's clean dry and intact.  Allergies:   Allergies  Allergen Reactions  . Codeine     unknown  . Statins     unknown  . Sulfa Antibiotics     unknown     Past Medical History  Diagnosis Date  . CHF (congestive heart failure)   . Hypertension   . CVA (cerebral vascular accident)   . Hypothyroidism   . COPD (chronic obstructive pulmonary disease)   . Sick sinus syndrome     status post pacemaker implantation  . Paroxysmal atrial fibrillation   . Subendocardial myocardial infarction   . Subdural hematoma   . CAD (coronary artery disease)     post PTCA and stenting of the LAD and Status post maze procedure  . History of gastrointestinal bleeding   . GERD (gastroesophageal reflux disease)   . Osteoporosis     History:  obtained from the patient.  Pt orientation to unit, room and routine. Information packet given to patient/family and safety video watched.  Admission INP armband ID verified with patient/family, and in place. SR up x 2, fall risk assessment complete with Patient and family verbalizing understanding of risks associated with falls. Pt verbalizes an understanding of how to use the call bell and to call for help before getting out of bed.  Skin, clean-dry- intact without skin tears.  She does have LLE redness No evidence of skin break down noted on exam. no rashes, no ecchymoses, no petechiae, no wounds    Will cont to monitor and  assist as needed.  [name] Madeline Lose, RN 01/27/2012 7:47 PM

## 2012-01-28 DIAGNOSIS — I4891 Unspecified atrial fibrillation: Secondary | ICD-10-CM

## 2012-01-28 LAB — COMPREHENSIVE METABOLIC PANEL
AST: 11 U/L (ref 0–37)
Albumin: 3.4 g/dL — ABNORMAL LOW (ref 3.5–5.2)
Alkaline Phosphatase: 92 U/L (ref 39–117)
CO2: 26 mEq/L (ref 19–32)
Chloride: 104 mEq/L (ref 96–112)
Creatinine, Ser: 1.18 mg/dL — ABNORMAL HIGH (ref 0.50–1.10)
GFR calc non Af Amer: 42 mL/min — ABNORMAL LOW (ref >90)
Potassium: 3.3 mEq/L — ABNORMAL LOW (ref 3.5–5.1)
Total Bilirubin: 0.5 mg/dL (ref 0.3–1.2)

## 2012-01-28 MED ORDER — DOXYCYCLINE HYCLATE 100 MG PO TABS: 100.0000 mg | ORAL_TABLET | Freq: Two times a day (BID) | ORAL | Status: AC

## 2012-01-28 MED ORDER — DOXYCYCLINE HYCLATE 100 MG PO TABS
100.0000 mg | ORAL_TABLET | Freq: Two times a day (BID) | ORAL | Status: DC
  Administered 2012-01-28: 100 mg via ORAL
  Filled 2012-01-28 (×2): qty 1

## 2012-01-28 NOTE — Progress Notes (Signed)
   CARE MANAGEMENT NOTE 01/28/2012  Patient:  [name], [name]   Account Number:  [id number]  Date Initiated:  01/28/2012  Documentation initiated by:  [facility]  Subjective/Objective Assessment:   Cellulitis of left lower leg     Action/Plan:   Anticipated DC Date:  01/28/2012   Anticipated DC Plan:  HOME W HOME HEALTH SERVICES      DC Planning Services  CM consult      [facility] Choice  HOME HEALTH   Choice offered to / List presented to:  C-1 Patient        HH arranged  HH-2 PT  HH-4 NURSE'S AIDE      HH agency  [facility]   Status of service:  Completed, signed off Medicare Important Message given?   (If response is "NO", the following Medicare IM given date fields will be blank) Date Medicare IM given:   Date Additional Medicare IM given:    Discharge Disposition:  HOME W HOME HEALTH SERVICES  Per UR Regulation:    If discussed at Long Length of Stay Meetings, dates discussed:    Comments:  01/28/2912 1700 Pt states she had HH with [facility] in the past. Referral sent through [facility] for [facility] for scheduled d/c today. Isidoro Donning RN CCM Case Mgmt phone [phone]

## 2012-01-28 NOTE — Progress Notes (Signed)
Physical Therapy Evaluation Patient Details Name: [name] MRN: [id number] DOB: 1929/10/08 Today's Date: 01/28/2012 Time: 4098-1191 PT Time Calculation (min): 29 min  PT Assessment / Plan / Recommendation Clinical Impression  78 yo female admitted with cellulitis, now improving, and plans for dc to home today; Pt's current functional level is appropriate for dc home; Recommend HHPT follow up as well as HHAide    PT Assessment  All further PT needs can be met in the next venue of care    Follow Up Recommendations  Home health PT;Supervision/Assistance - 24 hour    Barriers to Discharge        Equipment Recommendations   Pt currently sleeps in her lift chair, which also reclines; If pt and family are interested in a hospital bed, this may be an option with CHF as a qualifying diagnosis -- Pt will need HHPT to address the likely bed mobility functional deficits (as she has been sleeping in her lift chair) -- Instructed niece to follow up with pt's primary care physician if they are interested in a hospital bed   Recommendations for Other Services     Frequency      Precautions / Restrictions Precautions Precautions: Fall   Pertinent Vitals/Pain No specific reports of pain when asked; Noted her Left great toe more erythematous after walking with foot in dependent position       Mobility  Transfers Transfers: Sit to Stand;Stand to Sit Sit to Stand: 4: Min assist;From chair/3-in-1;With armrests;With upper extremity assist Stand to Sit: 4: Min assist;To chair/3-in-1;With upper extremity assist;With armrests Details for Transfer Assistance: Cues for safety and hand placement; decr control of descent Ambulation/Gait Ambulation/Gait Assistance: 4: Min assist Ambulation Distance (Feet): 30 Feet Assistive device: Rolling walker Ambulation/Gait Assistance Details: Overall slow, but steady gait for short distance; Cues for posture Gait Pattern: Decreased stride length      Exercises     PT Diagnosis: Difficulty walking;Generalized weakness  PT Problem List: Decreased strength;Decreased activity tolerance;Decreased balance;Decreased coordination;Decreased cognition;Decreased knowledge of use of DME;Decreased safety awareness PT Treatment Interventions:     PT Goals    Visit Information  Last PT Received On: 01/28/12 Assistance Needed: +1    Subjective Data  Subjective: Wanting to go home today Patient Stated Goal: Home   Prior Functioning  Home Living Lives With: Spouse;Other (Comment) (Family is close) Available Help at Discharge: Family;Available 24 hours/day Type of Home: House Home Access: Ramped entrance Home Layout: One level Bathroom Shower/Tub: Nurse, adult Accessibility: Yes How Accessible: Accessible via wheelchair;Accessible via walker Home Adaptive Equipment: Grab bars in shower;Walker - rolling;Wheelchair - manual (lift chair she sleeps in) Prior Function Level of Independence: Needs assistance Needs Assistance: Bathing;Dressing;Grooming;Toileting;Meal Prep;Light Housekeeping;Gait;Transfers Bath: Moderate (husband and neices help her) Meal Prep: Total Light Housekeeping: Total Gait Assistance: very close guard as pt is a high fall risk Comments: Pt's neice very helpful re: info about pt's PLOF; Apparently pt has had multiple falls, and somewhat  recently multiple fractures; the pt has been followed multiple times by HHPT, and has had long-term rehab at SNF level in teh past Communication Communication: No difficulties    Cognition  Overall Cognitive Status: History of cognitive impairments - at baseline Arousal/Alertness: Awake/alert Orientation Level: Appears intact for tasks assessed Behavior During Session: [facility] for tasks performed Cognition - Other Comments: Per neice, pt forgets she's not supposed to do things (like picking up something from the floor), and that results in falls; she states that Dr. [name]  doesn't necessarily want her to push progressive amb, just safety with household distances    Extremity/Trunk Assessment Right Upper Extremity Assessment RUE ROM/Strength/Tone: Deficits RUE ROM/Strength/Tone Deficits: Generally weak secondary to somewhat recent fracture Left Upper Extremity Assessment LUE ROM/Strength/Tone: Deficits LUE ROM/Strength/Tone Deficits: Generally weak Right Lower Extremity Assessment RLE ROM/Strength/Tone: Deficits RLE ROM/Strength/Tone Deficits: Generally weak, dependent on UE support/push for successful sit to stand Left Lower Extremity Assessment LLE ROM/Strength/Tone: Deficits LLE ROM/Strength/Tone Deficits: Generally weak, dependent on UE support/push for successful sit to stand   Balance    End of Session PT - End of Session Equipment Utilized During Treatment: Gait belt Activity Tolerance: Patient tolerated treatment well Patient left: in chair;with call bell/phone within reach;with family/visitor present Nurse Communication: Mobility status  GP     [name] [place], [place] [phone]  01/28/2012, 5:05 PM

## 2012-01-28 NOTE — Progress Notes (Signed)
Nsg Discharge Note  Admit Date:  01/27/2012 Discharge date: 01/28/2012   [name] to be D/C'd Home per MD order.  AVS completed.  Copy for chart, and copy for patient signed, and dated. Patient/caregiver able to verbalize understanding.  Discharge Medication:  Madeline, Mendez  Home Medication Instructions ZOX:[id number]   Printed on:01/28/12 1826  Medication Information                    escitalopram (LEXAPRO) 10 MG tablet Take 5 mg by mouth daily.            clopidogrel (PLAVIX) 75 MG tablet Take 75 mg by mouth daily.             zoledronic acid (RECLAST) 5 MG/100ML SOLN Inject 5 mg into the vein. Yearly            pantoprazole (PROTONIX) 40 MG tablet Take 40 mg by mouth daily.             metoprolol succinate (TOPROL-XL) 25 MG 24 hr tablet Take 25 mg by mouth daily.             furosemide (LASIX) 20 MG tablet Take 20 mg by mouth daily.           potassium chloride (K-DUR,KLOR-CON) 10 MEQ tablet Take 10 mEq by mouth daily.           Teriparatide, Recombinant, (FORTEO [place]) Inject 20 mcg into the skin every evening.           nitroGLYCERIN (NITROSTAT) 0.4 MG SL tablet Place 0.4 mg under the tongue every 5 (five) minutes as needed. For chest pain           levothyroxine (SYNTHROID, LEVOTHROID) 150 MCG tablet Take 150 mcg by mouth daily. Sunday-friday           levothyroxine (SYNTHROID, LEVOTHROID) 150 MCG tablet Take 225 mcg by mouth daily. saturday           OVER THE COUNTER MEDICATION Take 1 tablet by mouth daily. oscal ultra 600+D           vitamin C (ASCORBIC ACID) 500 MG tablet Take 500 mg by mouth daily.           Multiple Vitamin (MULTIVITAMIN WITH MINERALS) TABS Take 1 tablet by mouth daily.           aspirin EC 81 MG tablet Take 81 mg by mouth daily.           ranolazine (RANEXA) 500 MG 12 hr tablet Take 500 mg by mouth 2 (two) times daily.           amiodarone (PACERONE) 200 MG tablet Take 200 mg by mouth every Monday, Wednesday, and  Friday.           doxycycline (VIBRA-TABS) 100 MG tablet Take 1 tablet (100 mg total) by mouth every 12 (twelve) hours.             Discharge Assessment: Filed Vitals:   01/28/12 1433  BP: 147/72  Pulse: 71  Temp: 97.8 F (36.6 C)  Resp: 18   Skin clean, dry and intact without evidence of skin break down, no evidence of skin tears noted. Does have resolving redness and swelling on the LLE.   IV catheter discontinued intact. Site without signs and symptoms of complications - no redness or edema noted at insertion site, patient denies c/o pain - only slight tenderness at site.  Dressing with slight pressure applied.  D/c Instructions-Education: Discharge instructions given to patient/family with verbalized understanding. D/c education completed with patient/family including follow up instructions, medication list, d/c activities limitations if indicated, with other d/c instructions as indicated by MD - patient able to verbalize understanding, all questions fully answered. Patient instructed to return to ED, call 911, or call MD for any changes in condition.  Patient escorted via WC, and D/C home via private auto.  [place] Consuella Lose, RN 01/28/2012 6:26 PM

## 2012-01-28 NOTE — Discharge Summary (Signed)
Physician Discharge Summary  [name] ZOX:[id number] DOB: 1930-03-11 DOA: 01/27/2012  PCP: Ezequiel Kayser, MD  Admit date: 01/27/2012 Discharge date: 01/28/2012  Discharge Diagnoses:  Principal Problem:  *Cellulitis of left lower leg Active Problems:  Unspecified hypothyroidism  HYPERTENSION  CAD  Sick sinus syndrome  Pacemaker  Chronic systolic congestive heart failure, NYHA class 2 with an ejection fraction of 30%   Discharge Condition: Stable for the DC  Disposition:  Follow-up Information    Follow up with PERINI,MARK A, MD in 1 week. [facility] followup for cellulitis)    Contact information:   [address] [facility] [place] [address] [phone]          Diet: Heart healthy diet Wt Readings from Last 3 Encounters:  01/27/12 104.8 kg (231 lb 0.7 oz)  08/21/11 106.482 kg (234 lb 12 oz)  02/22/11 105.87 kg (233 lb 6.4 oz)    History of present illness:  78 year old female with past medical history of coronary arteries last and Duffy Rhody last year treated with percutaneous intraluminal coronary angioplasty back in 2009, sick sinus syndrome status post pacemaker on October 2012, paroxysmal A. fib on amiodarone hypothyroidism, chronic renal failure with a baseline creatinine of 1.2, angina on Ranexa, also history of systolic heart failure with an EF of 30% by last echo on January 2011 and a history of CABG back in December 2012 that comes in for left great toe pain and redness extending up her leg prior 4 days prior to admission. Progressively getting worse to the point where she can even applied weight to that foot as she has significant pain. She relates no history of gout. Exquisitely tender. She has had no cuts or sores. Had a recent tremor of her toenails about 3 weeks ago. She relates no fevers at home no nausea or vomiting or diarrhea.  She had a Doppler ultrasound here in the ED that was negative for DVT.   Hospital Course:   Principal Problem:  *Cellulitis of left lower leg: She was admitted to the hospital start her on IV vancomycin, by the next day she had a drastic improvement in her redness swelling and tenderness. The patient asked to home she was changed to doxycycline she will continue for a total of 7 days as an outpatient and will followup with Dr. Franchot Heidelberg is an outpatient.   All her other medical problems remained stable no changes were made to her medication.   Discharge Exam: Filed Vitals:   01/28/12 0528  BP: 135/64  Pulse: 70  Temp: 97.7 F (36.5 C)  Resp: 20   Filed Vitals:   01/27/12 1746 01/27/12 2008 01/27/12 2130 01/28/12 0528  BP: 167/71 131/60 138/66 135/64  Pulse: 70 70 70 70  Temp: 98.2 F (36.8 C) 98.3 F (36.8 C) 98.3 F (36.8 C) 97.7 F (36.5 C)  TempSrc: Oral Oral Oral Oral  Resp: 19 18 18 20   Height:  5\' 7"  (1.702 m)    Weight:  104.8 kg (231 lb 0.7 oz)    SpO2: 100% 94% 94% 91%   General: No acute distress Cardiovascular: Regular rate and rhythm with positive S1 and S2 Respiratory: Good air movement clear to auscultation  Discharge Instructions  Discharge Orders    Future Appointments: Provider: Department: Dept Phone: Center:   02/15/2012 12:35 PM [facility] [phone] [facility]   03/07/2012 3:00 PM Vesta Mixer, MD [facility] [phone] None   04/29/2012 10:00 AM Hillis Range,  MD [facility] [phone] [facility]     Future Orders Please Complete By Expires   Diet - low sodium heart healthy      Increase activity slowly        Medication List  As of 01/28/2012 10:18 AM   TAKE these medications         amiodarone 200 MG tablet   Commonly known as: PACERONE   Take 200 mg by mouth every Monday, Wednesday, and Friday.      aspirin EC 81 MG tablet   Take 81 mg by mouth daily.      clopidogrel 75 MG tablet   Commonly known as: PLAVIX   Take 75 mg by mouth daily.      FORTEO [place]   Inject 20  mcg into the skin every evening.      furosemide 20 MG tablet   Commonly known as: LASIX   Take 20 mg by mouth daily.      levothyroxine 150 MCG tablet   Commonly known as: SYNTHROID, LEVOTHROID   Take 150 mcg by mouth daily. Sunday-friday      levothyroxine 150 MCG tablet   Commonly known as: SYNTHROID, LEVOTHROID   Take 225 mcg by mouth daily. saturday      LEXAPRO 10 MG tablet   Generic drug: escitalopram   Take 5 mg by mouth daily.      metoprolol succinate 25 MG 24 hr tablet   Commonly known as: TOPROL-XL   Take 25 mg by mouth daily.      multivitamin with minerals Tabs   Take 1 tablet by mouth daily.      nitroGLYCERIN 0.4 MG SL tablet   Commonly known as: NITROSTAT   Place 0.4 mg under the tongue every 5 (five) minutes as needed. For chest pain      OVER THE COUNTER MEDICATION   Take 1 tablet by mouth daily. oscal ultra 600+D      pantoprazole 40 MG tablet   Commonly known as: PROTONIX   Take 40 mg by mouth daily.      potassium chloride 10 MEQ tablet   Commonly known as: K-DUR,KLOR-CON   Take 10 mEq by mouth daily.      ranolazine 500 MG 12 hr tablet   Commonly known as: RANEXA   Take 500 mg by mouth 2 (two) times daily.      RECLAST 5 MG/100ML Soln   Generic drug: zoledronic acid   Inject 5 mg into the vein. Yearly      vitamin C 500 MG tablet   Commonly known as: ASCORBIC ACID   Take 500 mg by mouth daily.              The results of significant diagnostics from this hospitalization (including imaging, microbiology, ancillary and laboratory) are listed below for reference.    Significant Diagnostic Studies: No results found.  Microbiology: No results found for this or any previous visit (from the past 240 hour(s)).   Labs: Basic Metabolic Panel:  Lab 01/28/12 9811 01/27/12 2058 01/27/12 1606  NA 142 -- 139  K 3.3* -- 3.7  CL 104 -- 98  CO2 26 -- 28  GLUCOSE 102* -- 121*  BUN 21 -- 25*  CREATININE 1.18* 1.25* 1.25*  CALCIUM 8.8 --  10.4  MG -- -- --  PHOS -- -- --   Liver Function Tests:  Lab 01/28/12 0518  AST 11  ALT 10  ALKPHOS 92  BILITOT 0.5  PROT 6.6  ALBUMIN 3.4*   No results found for this basename: LIPASE:5,AMYLASE:5 in the last 168 hours No results found for this basename: AMMONIA:5 in the last 168 hours CBC:  Lab 01/27/12 2058 01/27/12 1606  WBC 6.7 7.8  NEUTROABS -- 5.6  HGB 11.0* 12.9  HCT 33.1* 38.6  MCV 89.9 90.2  PLT 194 227   Cardiac Enzymes: No results found for this basename: CKTOTAL:5,CKMB:5,CKMBINDEX:5,TROPONINI:5 in the last 168 hours BNP: No components found with this basename: POCBNP:5 CBG: No results found for this basename: GLUCAP:5 in the last 168 hours  Time coordinating discharge: Greater than 35 minutes  Signed:  Marinda Elk  [facility] 01/28/2012, 10:18 AM

## 2012-02-03 LAB — CULTURE, BLOOD (ROUTINE X 2): Culture: NO GROWTH

## 2012-02-14 NOTE — ED Provider Notes (Signed)
Medical screening examination/treatment/procedure(s) were conducted as a shared visit with non-physician practitioner(s) and myself.  I personally evaluated the patient during the encounter   Joya Gaskins, MD 02/14/12 1714

## 2012-02-15 ENCOUNTER — Encounter: Payer: Medicare Other | Admitting: *Deleted

## 2012-02-22 ENCOUNTER — Encounter: Payer: Self-pay | Admitting: *Deleted

## 2012-03-01 ENCOUNTER — Telehealth: Payer: Self-pay | Admitting: Internal Medicine

## 2012-03-01 NOTE — Telephone Encounter (Signed)
New Problem:   Patient's niece called in stating that she will have her aunt transmit later this afternoon and would like a call back on Tuesday to confirm if we received the transmission.  Please call back on 03/05/12.

## 2012-03-05 NOTE — Telephone Encounter (Signed)
Returned call in regards to receiving remote transmission. Transmission was received. Spoke w/niece to let know.

## 2012-03-07 ENCOUNTER — Encounter: Payer: Self-pay | Admitting: Cardiovascular Disease

## 2012-03-07 ENCOUNTER — Ambulatory Visit (INDEPENDENT_AMBULATORY_CARE_PROVIDER_SITE_OTHER): Payer: Medicare Other | Admitting: Cardiovascular Disease

## 2012-03-07 VITALS — BP 123/63 | HR 71 | Ht 67.0 in | Wt 225.8 lb

## 2012-03-07 DIAGNOSIS — I2510 Atherosclerotic heart disease of native coronary artery without angina pectoris: Secondary | ICD-10-CM

## 2012-03-07 DIAGNOSIS — I10 Essential (primary) hypertension: Secondary | ICD-10-CM

## 2012-03-07 DIAGNOSIS — I4891 Unspecified atrial fibrillation: Secondary | ICD-10-CM

## 2012-03-07 NOTE — Assessment & Plan Note (Signed)
[name] been avoiding eating lots of salty foods as she has in the past. Her blood pressure is much better.

## 2012-03-07 NOTE — Patient Instructions (Signed)
Your physician recommends that you continue on your current medications as directed. Please refer to the Current Medication list given to you today.  Your physician wants you to follow-up in: 6 months. You will receive a reminder letter in the mail two months in advance. If you don't receive a letter, please call our office to schedule the follow-up appointment.  

## 2012-03-07 NOTE — Assessment & Plan Note (Signed)
[name] is doing well. [name] not had any recent episodes of chest pain. [name] not able to exercise much since her subdural hematoma.

## 2012-03-07 NOTE — Progress Notes (Signed)
Modine G Byrum Date of Birth  02/25/1930 [facility]     [place] [facility]  [address]    [address]   [address] [place], [place]  [address]    [place], [place]  [address] [phone]  Fax  [phone]  [phone]  Fax [phone]  Problem list: 1. Coronary artery disease-status post PTCA and stenting of the LAD 2. Status post CABG and Maze procedure, December 2010 B. Congestive heart failure 4. Hypertension 5. Hypothyroidism  6. Sick sinus syndrome-status post pacemaker implantation 7. History of atrial fibrillation 8. COPD 9. Subdural hematoma 10. Frequent falls / loss of balance   History of Present Illness:  Patient is doing well today.  She complains of poor eye sight this am.  She has continued to have dyspnea.  She does not exercise anymore due to the high risk for falling.  She fell again this week and hit the back of her head.  She continues to be very unsteady with walking. She walks with the assistance of a walker.    Current Outpatient Prescriptions on File Prior to Visit  Medication Sig Dispense Refill  . amiodarone (PACERONE) 200 MG tablet Take 200 mg by mouth every Monday, Wednesday, and Friday.      Marland Kitchen aspirin EC 81 MG tablet Take 81 mg by mouth daily.      . clopidogrel (PLAVIX) 75 MG tablet Take 75 mg by mouth daily.        Marland Kitchen escitalopram (LEXAPRO) 10 MG tablet Take 5 mg by mouth daily.       . furosemide (LASIX) 20 MG tablet Take 20 mg by mouth daily.      Marland Kitchen levothyroxine (SYNTHROID, LEVOTHROID) 150 MCG tablet Take 150 mcg by mouth daily. Sunday-friday      . levothyroxine (SYNTHROID, LEVOTHROID) 150 MCG tablet Take 225 mcg by mouth daily. saturday      . metoprolol succinate (TOPROL-XL) 25 MG 24 hr tablet Take 25 mg by mouth daily.        . Multiple Vitamin (MULTIVITAMIN WITH MINERALS) TABS Take 1 tablet by mouth daily.      . nitroGLYCERIN (NITROSTAT) 0.4 MG SL tablet Place 0.4 mg under the tongue every 5 (five) minutes as needed.  For chest pain      . OVER THE COUNTER MEDICATION Take 1 tablet by mouth daily. oscal ultra 600+D      . pantoprazole (PROTONIX) 40 MG tablet Take 40 mg by mouth daily.        . potassium chloride (K-DUR,KLOR-CON) 10 MEQ tablet Take 10 mEq by mouth daily.      . ranolazine (RANEXA) 500 MG 12 hr tablet Take 500 mg by mouth 2 (two) times daily.      . Teriparatide, Recombinant, (FORTEO [place]) Inject 20 mcg into the skin every evening.      . vitamin C (ASCORBIC ACID) 500 MG tablet Take 500 mg by mouth daily.      . zoledronic acid (RECLAST) 5 MG/100ML SOLN Inject 5 mg into the vein. Yearly         Allergies  Allergen Reactions  . Codeine     unknown  . Statins     unknown  . Sulfa Antibiotics     unknown    Past Medical History  Diagnosis Date  . CHF (congestive heart failure)   . Hypertension   . CVA (cerebral vascular accident)   . Hypothyroidism   . COPD (chronic obstructive pulmonary  disease)   . Sick sinus syndrome     status post pacemaker implantation  . Paroxysmal atrial fibrillation   . Subendocardial myocardial infarction   . Subdural hematoma   . CAD (coronary artery disease)     post PTCA and stenting of the LAD and Status post maze procedure  . History of gastrointestinal bleeding   . GERD (gastroesophageal reflux disease)   . Osteoporosis     Past Surgical History  Procedure Date  . Coronary artery bypass graft 06/2009  . Cardiac catheterization 08/17/2009    Est. EF is 35% to 40%  . Pacemaker insertion     MDT implanted by Dr Reyes Ivan for SSS    History  Smoking status  . Never Smoker   Smokeless tobacco  . Never Used    History  Alcohol Use No    Family History  Problem Relation Age of Onset  . Lung cancer Father   . Heart attack Mother   . Coronary artery disease Mother   . Coronary artery disease Brother     with CABG  . Heart attack Brother   . Heart attack Brother   . Lung cancer Brother   . Coronary artery disease Sister   .  Hypertension Mother   . Hypertension Brother   . Hypertension Brother     Reviw of Systems:  Reviewed in the HPI.  All other systems are negative.  Physical Exam: Blood pressure 123/63, pulse 71, height 5\' 7"  (1.702 m), weight 225 lb 12 oz (102.4 kg). General: Well developed, well nourished, in no acute distress.  Head: Normocephalic, atraumatic, sclera non-icteric, mucus membranes are moist,   Neck: Supple. Negative for carotid bruits. JVD not elevated.  Lungs: Clear bilaterally to auscultation without wheezes, rales, or rhonchi. Breathing is unlabored.  Heart: RRR with S1 S2. No murmurs, rubs, or gallops appreciated.  Abdomen: Soft, non-tender, non-distended with normoactive bowel sounds. No hepatomegaly. No rebound/guarding. No obvious abdominal masses.  Msk:  Strength and tone appear normal for age.  Extremities: No clubbing or cyanosis. 1-2+ pitting edema.  Distal pedal pulses are 2+ and equal bilaterally.  Neuro: Alert and oriented X 3. Moves all extremities spontaneously.  Psych:  Responds to questions appropriately with a normal affect.  She has made a significant recovery in her speech.  ECG: Sept. 5, 2013 - Atrial pacing, inc. RBBB Assessment / Plan:

## 2012-04-22 ENCOUNTER — Other Ambulatory Visit: Payer: Self-pay | Admitting: Cardiovascular Disease

## 2012-04-22 NOTE — Telephone Encounter (Signed)
Fax Received. Refill Completed. [name] (R.M.A)   

## 2012-04-23 ENCOUNTER — Encounter: Payer: Self-pay | Admitting: *Deleted

## 2012-04-29 ENCOUNTER — Encounter: Payer: Self-pay | Admitting: Internal Medicine

## 2012-04-29 ENCOUNTER — Ambulatory Visit (INDEPENDENT_AMBULATORY_CARE_PROVIDER_SITE_OTHER): Payer: Medicare Other | Admitting: Internal Medicine

## 2012-04-29 VITALS — BP 148/74 | HR 71 | Ht 67.0 in | Wt 228.4 lb

## 2012-04-29 DIAGNOSIS — I2510 Atherosclerotic heart disease of native coronary artery without angina pectoris: Secondary | ICD-10-CM

## 2012-04-29 DIAGNOSIS — I4891 Unspecified atrial fibrillation: Secondary | ICD-10-CM

## 2012-04-29 DIAGNOSIS — I495 Sick sinus syndrome: Secondary | ICD-10-CM

## 2012-04-29 LAB — PACEMAKER DEVICE OBSERVATION
AL AMPLITUDE: 2.5 mv
AL IMPEDENCE PM: 440 Ohm
AL THRESHOLD: 1 V
RV LEAD AMPLITUDE: 7.9 mv
RV LEAD IMPEDENCE PM: 568 Ohm
RV LEAD THRESHOLD: 1.5 V

## 2012-04-29 NOTE — Progress Notes (Signed)
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 female who presents today for routine electrophysiology followup.  Since last being seen in our clinic, the patient reports doing very well.  Her primary issue is frequent falls.  She reports unsteadiness with ambulation.  She denies syncope.  Today, she denies symptoms of palpitations, chest pain, shortness of breath,  or lower extremity edema.  The patient is otherwise without complaint today.   Past Medical History  Diagnosis Date  . CHF (congestive heart failure)   . Hypertension   . CVA (cerebral vascular accident)   . Hypothyroidism   . COPD (chronic obstructive pulmonary disease)   . Sick sinus syndrome     status post pacemaker implantation  . Paroxysmal atrial fibrillation   . Subendocardial myocardial infarction   . Subdural hematoma   . CAD (coronary artery disease)     post PTCA and stenting of the LAD and Status post maze procedure  . History of gastrointestinal bleeding   . GERD (gastroesophageal reflux disease)   . Osteoporosis    Past Surgical History  Procedure Date  . Coronary artery bypass graft 06/2009  . Cardiac catheterization 08/17/2009    Est. EF is 35% to 40%  . Pacemaker insertion     MDT implanted by Dr Reyes Ivan for SSS    Current Outpatient Prescriptions  Medication Sig Dispense Refill  . amiodarone (PACERONE) 200 MG tablet Take 200 mg by mouth every Monday, Wednesday, and Friday.      Marland Kitchen aspirin EC 81 MG tablet Take 81 mg by mouth daily.      . clopidogrel (PLAVIX) 75 MG tablet Take 75 mg by mouth daily.        Marland Kitchen escitalopram (LEXAPRO) 10 MG tablet Take 5 mg by mouth daily.       . furosemide (LASIX) 20 MG tablet Take 20 mg by mouth daily.      Marland Kitchen levothyroxine (SYNTHROID, LEVOTHROID) 150 MCG tablet Take 150 mcg by mouth daily. Sunday-friday      . levothyroxine (SYNTHROID, LEVOTHROID) 150 MCG tablet Take 225 mcg by mouth daily. saturday      . metoprolol succinate  (TOPROL-XL) 25 MG 24 hr tablet Take 25 mg by mouth daily.        . Multiple Vitamin (MULTIVITAMIN WITH MINERALS) TABS Take 1 tablet by mouth daily.      . nitroGLYCERIN (NITROSTAT) 0.4 MG SL tablet Place 0.4 mg under the tongue every 5 (five) minutes as needed. For chest pain      . OVER THE COUNTER MEDICATION Take 1 tablet by mouth daily. oscal ultra 600+D      . pantoprazole (PROTONIX) 40 MG tablet Take 40 mg by mouth daily.        . potassium chloride (K-DUR,KLOR-CON) 10 MEQ tablet Take 10 mEq by mouth daily.      Marland Kitchen RANEXA 500 MG 12 hr tablet TAKE ONE TABLET BY MOUTH TWICE DAILY  60 tablet  5  . Teriparatide, Recombinant, (FORTEO [place]) Inject 20 mcg into the skin every evening.      . vitamin C (ASCORBIC ACID) 500 MG tablet Take 500 mg by mouth daily.      . zoledronic acid (RECLAST) 5 MG/100ML SOLN Inject 5 mg into the vein. Yearly         Physical Exam: Filed Vitals:   04/29/12 1026  BP: 148/74  Pulse: 71  Height: 5\' 7"  (1.702 m)  Weight:  228 lb 6.4 oz (103.602 kg)    GEN- The patient is elderly and obese appearing, alert and oriented x 3 today.   Head- normocephalic, atraumatic Eyes-  Sclera clear, conjunctiva pink Ears- hearing intact Oropharynx- clear Lungs- Clear to ausculation bilaterally, normal work of breathing Chest- pacemaker pocket is well healed Heart- Regular rate and rhythm, no murmurs, rubs or gallops, PMI not laterally displaced GI- soft, NT, ND, + BS Extremities- no clubbing, cyanosis, or edema  Pacemaker interrogation- reviewed in detail today,  See PACEART report  Assessment and Plan:   BRADYCARDIA  Normal pacemaker function  See Pace Art report  No changes today   ATRIAL FIBRILLATION  Pacemaker interrogation reveals no episodes of afib over the past 2 years.  Given frequent falls, she would not be a candidate for anticoagulation  CAD - Hillis Range, MD  Stable  No change required today Follow-up with Dr Elease Hashimoto  Return to the device clinic in 1  year

## 2012-04-29 NOTE — Patient Instructions (Signed)
Your physician wants you to follow-up in: 12 months with DrAllred You will receive a reminder letter in the mail two months in advance. If you don't receive a letter, please call our office to schedule the follow-up appointment.   Remote monitoring is used to monitor your Pacemaker of ICD from home. This monitoring reduces the number of office visits required to check your device to one time per year. It allows [place] to keep an eye on the functioning of your device to ensure it is working properly. You are scheduled for a device check from home on 08/05/12. You may send your transmission at any time that day. If you have a wireless device, the transmission will be sent automatically. After your physician reviews your transmission, you will receive a postcard with your next transmission date.

## 2012-07-30 ENCOUNTER — Encounter (HOSPITAL_COMMUNITY): Payer: Self-pay | Admitting: Emergency Medicine

## 2012-07-30 ENCOUNTER — Emergency Department (HOSPITAL_COMMUNITY): Payer: Medicare Other

## 2012-07-30 ENCOUNTER — Inpatient Hospital Stay (HOSPITAL_COMMUNITY)
Admission: EM | Admit: 2012-07-30 | Discharge: 2012-08-02 | DRG: 184 | Disposition: A | Discharge status: Skilled Nursing Facility | Payer: Medicare Other | Attending: Internal Medicine | Admitting: Internal Medicine

## 2012-07-30 DIAGNOSIS — I5022 Chronic systolic (congestive) heart failure: Secondary | ICD-10-CM

## 2012-07-30 DIAGNOSIS — Z950 Presence of cardiac pacemaker: Secondary | ICD-10-CM

## 2012-07-30 DIAGNOSIS — S2249XA Multiple fractures of ribs, unspecified side, initial encounter for closed fracture: Secondary | ICD-10-CM

## 2012-07-30 DIAGNOSIS — E039 Hypothyroidism, unspecified: Secondary | ICD-10-CM | POA: Diagnosis present

## 2012-07-30 DIAGNOSIS — Z8249 Family history of ischemic heart disease and other diseases of the circulatory system: Secondary | ICD-10-CM

## 2012-07-30 DIAGNOSIS — W19XXXA Unspecified fall, initial encounter: Secondary | ICD-10-CM | POA: Diagnosis present

## 2012-07-30 DIAGNOSIS — M810 Age-related osteoporosis without current pathological fracture: Secondary | ICD-10-CM | POA: Diagnosis present

## 2012-07-30 DIAGNOSIS — Z79899 Other long term (current) drug therapy: Secondary | ICD-10-CM

## 2012-07-30 DIAGNOSIS — Z951 Presence of aortocoronary bypass graft: Secondary | ICD-10-CM

## 2012-07-30 DIAGNOSIS — K219 Gastro-esophageal reflux disease without esophagitis: Secondary | ICD-10-CM | POA: Diagnosis present

## 2012-07-30 DIAGNOSIS — E669 Obesity, unspecified: Secondary | ICD-10-CM | POA: Diagnosis present

## 2012-07-30 DIAGNOSIS — I10 Essential (primary) hypertension: Secondary | ICD-10-CM | POA: Diagnosis present

## 2012-07-30 DIAGNOSIS — Z6831 Body mass index (BMI) 31.0-31.9, adult: Secondary | ICD-10-CM

## 2012-07-30 DIAGNOSIS — I495 Sick sinus syndrome: Secondary | ICD-10-CM | POA: Diagnosis present

## 2012-07-30 DIAGNOSIS — L03116 Cellulitis of left lower limb: Secondary | ICD-10-CM

## 2012-07-30 DIAGNOSIS — Z888 Allergy status to other drugs, medicaments and biological substances status: Secondary | ICD-10-CM

## 2012-07-30 DIAGNOSIS — Y92009 Unspecified place in unspecified non-institutional (private) residence as the place of occurrence of the external cause: Secondary | ICD-10-CM

## 2012-07-30 DIAGNOSIS — J449 Chronic obstructive pulmonary disease, unspecified: Secondary | ICD-10-CM | POA: Diagnosis present

## 2012-07-30 DIAGNOSIS — W1809XA Striking against other object with subsequent fall, initial encounter: Secondary | ICD-10-CM | POA: Diagnosis present

## 2012-07-30 DIAGNOSIS — Y93E1 Activity, personal bathing and showering: Secondary | ICD-10-CM

## 2012-07-30 DIAGNOSIS — I4891 Unspecified atrial fibrillation: Secondary | ICD-10-CM | POA: Diagnosis present

## 2012-07-30 DIAGNOSIS — R600 Localized edema: Secondary | ICD-10-CM

## 2012-07-30 DIAGNOSIS — D62 Acute posthemorrhagic anemia: Secondary | ICD-10-CM | POA: Diagnosis not present

## 2012-07-30 DIAGNOSIS — Z7982 Long term (current) use of aspirin: Secondary | ICD-10-CM

## 2012-07-30 DIAGNOSIS — Z9861 Coronary angioplasty status: Secondary | ICD-10-CM

## 2012-07-30 DIAGNOSIS — Z882 Allergy status to sulfonamides status: Secondary | ICD-10-CM

## 2012-07-30 DIAGNOSIS — R296 Repeated falls: Secondary | ICD-10-CM | POA: Diagnosis present

## 2012-07-30 DIAGNOSIS — E785 Hyperlipidemia, unspecified: Secondary | ICD-10-CM | POA: Diagnosis present

## 2012-07-30 DIAGNOSIS — J4489 Other specified chronic obstructive pulmonary disease: Secondary | ICD-10-CM | POA: Diagnosis present

## 2012-07-30 DIAGNOSIS — I2510 Atherosclerotic heart disease of native coronary artery without angina pectoris: Secondary | ICD-10-CM | POA: Diagnosis present

## 2012-07-30 DIAGNOSIS — I252 Old myocardial infarction: Secondary | ICD-10-CM

## 2012-07-30 DIAGNOSIS — Z8673 Personal history of transient ischemic attack (TIA), and cerebral infarction without residual deficits: Secondary | ICD-10-CM

## 2012-07-30 DIAGNOSIS — I059 Rheumatic mitral valve disease, unspecified: Secondary | ICD-10-CM

## 2012-07-30 DIAGNOSIS — Z9181 History of falling: Secondary | ICD-10-CM

## 2012-07-30 LAB — CBC WITH DIFFERENTIAL/PLATELET
Basophils Absolute: 0.1 10*3/uL (ref 0.0–0.1)
Eosinophils Absolute: 0.2 10*3/uL (ref 0.0–0.7)
Eosinophils Relative: 1 % (ref 0–5)
HCT: 36.9 % (ref 36.0–46.0)
Lymphocytes Relative: 9 % — ABNORMAL LOW (ref 12–46)
MCH: 30.2 pg (ref 26.0–34.0)
MCV: 91.3 fL (ref 78.0–100.0)
Monocytes Absolute: 1.2 10*3/uL — ABNORMAL HIGH (ref 0.1–1.0)
RDW: 14.9 % (ref 11.5–15.5)
WBC: 14.2 10*3/uL — ABNORMAL HIGH (ref 4.0–10.5)

## 2012-07-30 LAB — TSH: TSH: 0.771 u[IU]/mL (ref 0.350–4.500)

## 2012-07-30 LAB — URINALYSIS, ROUTINE W REFLEX MICROSCOPIC
Bilirubin Urine: NEGATIVE
Glucose, UA: NEGATIVE mg/dL
Hgb urine dipstick: NEGATIVE
Ketones, ur: NEGATIVE mg/dL
Protein, ur: NEGATIVE mg/dL
Urobilinogen, UA: 0.2 mg/dL (ref 0.0–1.0)

## 2012-07-30 LAB — BASIC METABOLIC PANEL
BUN: 25 mg/dL — ABNORMAL HIGH (ref 6–23)
CO2: 29 mEq/L (ref 19–32)
Calcium: 10.4 mg/dL (ref 8.4–10.5)
Creatinine, Ser: 1.32 mg/dL — ABNORMAL HIGH (ref 0.50–1.10)
GFR calc non Af Amer: 37 mL/min — ABNORMAL LOW (ref >90)
Glucose, Bld: 90 mg/dL (ref 70–99)

## 2012-07-30 LAB — TROPONIN I: Troponin I: <0.3 ng/mL (ref <0.30)

## 2012-07-30 MED ORDER — FUROSEMIDE 20 MG PO TABS
20.0000 mg | ORAL_TABLET | Freq: Every day | ORAL | Status: DC
  Administered 2012-07-31 – 2012-08-02 (×3): 20 mg via ORAL
  Filled 2012-07-30 (×3): qty 1

## 2012-07-30 MED ORDER — POTASSIUM CHLORIDE CRYS ER 10 MEQ PO TBCR
10.0000 meq | EXTENDED_RELEASE_TABLET | Freq: Every day | ORAL | Status: DC
  Administered 2012-07-31 – 2012-08-02 (×3): 10 meq via ORAL
  Filled 2012-07-30 (×3): qty 1

## 2012-07-30 MED ORDER — HYDROMORPHONE HCL PF 1 MG/ML IJ SOLN
1.0000 mg | Freq: Once | INTRAMUSCULAR | Status: AC
  Administered 2012-07-30: 1 mg via INTRAVENOUS
  Filled 2012-07-30: qty 1

## 2012-07-30 MED ORDER — RISAQUAD PO CAPS
1.0000 | ORAL_CAPSULE | Freq: Every day | ORAL | Status: DC
  Administered 2012-07-31 – 2012-08-02 (×3): 1 via ORAL
  Filled 2012-07-30 (×3): qty 1

## 2012-07-30 MED ORDER — ACETAMINOPHEN 325 MG PO TABS
650.0000 mg | ORAL_TABLET | Freq: Four times a day (QID) | ORAL | Status: DC | PRN
  Administered 2012-07-30 – 2012-08-01 (×3): 650 mg via ORAL
  Filled 2012-07-30 (×4): qty 2

## 2012-07-30 MED ORDER — IOHEXOL 300 MG/ML  SOLN
80.0000 mL | Freq: Once | INTRAMUSCULAR | Status: AC | PRN
  Administered 2012-07-30: 80 mL via INTRAVENOUS

## 2012-07-30 MED ORDER — SODIUM CHLORIDE 0.9 % IV SOLN
INTRAVENOUS | Status: DC
  Administered 2012-07-30 – 2012-07-31 (×2): via INTRAVENOUS

## 2012-07-30 MED ORDER — SODIUM CHLORIDE 0.9 % IJ SOLN
3.0000 mL | Freq: Two times a day (BID) | INTRAMUSCULAR | Status: DC
  Administered 2012-08-01: 3 mL via INTRAVENOUS

## 2012-07-30 MED ORDER — METOPROLOL SUCCINATE ER 25 MG PO TB24
25.0000 mg | ORAL_TABLET | Freq: Every day | ORAL | Status: DC
  Administered 2012-07-31 – 2012-08-02 (×3): 25 mg via ORAL
  Filled 2012-07-30 (×3): qty 1

## 2012-07-30 MED ORDER — ESCITALOPRAM OXALATE 5 MG PO TABS
5.0000 mg | ORAL_TABLET | Freq: Every day | ORAL | Status: DC
  Administered 2012-07-31 – 2012-08-02 (×3): 5 mg via ORAL
  Filled 2012-07-30 (×3): qty 1

## 2012-07-30 MED ORDER — PANTOPRAZOLE SODIUM 40 MG PO TBEC
40.0000 mg | DELAYED_RELEASE_TABLET | Freq: Every day | ORAL | Status: DC
  Administered 2012-07-31 – 2012-08-02 (×3): 40 mg via ORAL
  Filled 2012-07-30 (×3): qty 1

## 2012-07-30 MED ORDER — LEVALBUTEROL HCL 0.63 MG/3ML IN NEBU
0.6300 mg | INHALATION_SOLUTION | Freq: Four times a day (QID) | RESPIRATORY_TRACT | Status: DC | PRN
  Filled 2012-07-30: qty 3

## 2012-07-30 MED ORDER — ADULT MULTIVITAMIN W/MINERALS CH
1.0000 | ORAL_TABLET | Freq: Every day | ORAL | Status: DC
  Administered 2012-07-31 – 2012-08-02 (×3): 1 via ORAL
  Filled 2012-07-30 (×3): qty 1

## 2012-07-30 MED ORDER — ACETAMINOPHEN 650 MG RE SUPP: 650.0000 mg | Freq: Four times a day (QID) | RECTAL | Status: DC | PRN

## 2012-07-30 MED ORDER — ASPIRIN EC 81 MG PO TBEC
81.0000 mg | DELAYED_RELEASE_TABLET | Freq: Every day | ORAL | Status: DC
Start: 2012-07-30 — End: 2012-08-02
  Administered 2012-07-30 – 2012-08-02 (×4): 81 mg via ORAL
  Filled 2012-07-30 (×4): qty 1

## 2012-07-30 MED ORDER — ONDANSETRON HCL 4 MG/2ML IJ SOLN: 4.0000 mg | Freq: Four times a day (QID) | INTRAMUSCULAR | Status: DC | PRN

## 2012-07-30 MED ORDER — RANOLAZINE ER 500 MG PO TB12
500.0000 mg | ORAL_TABLET | Freq: Two times a day (BID) | ORAL | Status: DC
Start: 2012-07-30 — End: 2012-08-02
  Administered 2012-07-30 – 2012-08-02 (×6): 500 mg via ORAL
  Filled 2012-07-30 (×7): qty 1

## 2012-07-30 MED ORDER — AMIODARONE HCL 200 MG PO TABS
200.0000 mg | ORAL_TABLET | ORAL | Status: DC
  Administered 2012-07-31 – 2012-08-02 (×2): 200 mg via ORAL
  Filled 2012-07-30 (×2): qty 1

## 2012-07-30 MED ORDER — LEVOTHYROXINE SODIUM 150 MCG PO TABS
150.0000 ug | ORAL_TABLET | Freq: Every day | ORAL | Status: DC
  Administered 2012-07-31 – 2012-08-02 (×2): 150 ug via ORAL
  Filled 2012-07-30 (×4): qty 1

## 2012-07-30 MED ORDER — CLOPIDOGREL BISULFATE 75 MG PO TABS
75.0000 mg | ORAL_TABLET | Freq: Every day | ORAL | Status: DC
  Administered 2012-07-31: 75 mg via ORAL
  Filled 2012-07-30: qty 1

## 2012-07-30 MED ORDER — SODIUM CHLORIDE 0.9 % IV SOLN
Freq: Once | INTRAVENOUS | Status: AC
  Administered 2012-07-30: 15:00:00 via INTRAVENOUS

## 2012-07-30 MED ORDER — ONDANSETRON HCL 4 MG PO TABS: 4.0000 mg | ORAL_TABLET | Freq: Four times a day (QID) | ORAL | Status: DC | PRN

## 2012-07-30 NOTE — ED Provider Notes (Signed)
History     CSN: [id number]  Arrival date & time 07/30/12  1242   First MD Initiated Contact with Patient 07/30/12 1303      Chief Complaint  Patient presents with  . Fall    (Consider location/radiation/quality/duration/timing/severity/associated sxs/prior treatment) HPI Patient has fallen 3 time in the past 5 days. Agents niece reports that she's been intimately confused. She last fell this morning in the bathroom. Striking her left ribs. [name] also struck her head within the past week complaint of headache and neck pain yesterday. No treatment prior to coming here. Patient walks with walker , she's not using her walker when she fell this morning. She presently complains of left posterior rib pain and pain AT BUTTOCKS. Pain is worse with movement not improved by anything, constant denies abdominal pain does state she had abdominal pain yesterday. Past Medical History  Diagnosis Date  . CHF (congestive heart failure)   . Hypertension   . CVA (cerebral vascular accident)   . Hypothyroidism   . COPD (chronic obstructive pulmonary disease)   . Sick sinus syndrome     status post pacemaker implantation  . Paroxysmal atrial fibrillation   . Subendocardial myocardial infarction   . Subdural hematoma   . CAD (coronary artery disease)     post PTCA and stenting of the LAD and Status post maze procedure  . History of gastrointestinal bleeding   . GERD (gastroesophageal reflux disease)   . Osteoporosis     Past Surgical History  Procedure Date  . Coronary artery bypass graft 06/2009  . Cardiac catheterization 08/17/2009    Est. EF is 35% to 40%  . Pacemaker insertion     MDT implanted by Dr Reyes Ivan for SSS    Family History  Problem Relation Age of Onset  . Lung cancer Father   . Heart attack Mother   . Coronary artery disease Mother   . Coronary artery disease Brother     with CABG  . Heart attack Brother   . Heart attack Brother   . Lung cancer Brother   . Coronary  artery disease Sister   . Hypertension Mother   . Hypertension Brother   . Hypertension Brother     History  Substance Use Topics  . Smoking status: Never Smoker   . Smokeless tobacco: Never Used  . Alcohol Use: No    OB History    Grav Para Term Preterm Abortions TAB SAB Ect Mult Living                  Review of Systems  HENT: Positive for neck pain.   Cardiovascular: Positive for chest pain.       Left posterior rib pain  Musculoskeletal: Positive for arthralgias.  Neurological: Positive for headaches.       Walks with walker, intermittently confused  Hematological: Bruises/bleeds easily.  All other systems reviewed and are negative.    Allergies  Codeine; Statins; and Sulfa antibiotics  Home Medications   Current Outpatient Rx  Name  Route  Sig  Dispense  Refill  . RISAQUAD PO CAPS   Oral   Take 1 capsule by mouth daily.         . AMIODARONE HCL 200 MG PO TABS   Oral   Take 200 mg by mouth every Monday, Wednesday, and Friday.         . ASPIRIN EC 81 MG PO TBEC   Oral   Take 81 mg by mouth daily.         Marland Kitchen  BIOTIN PO   Oral   Take 1 tablet by mouth daily.         Marland Kitchen CLOPIDOGREL BISULFATE 75 MG PO TABS   Oral   Take 75 mg by mouth daily.           Marland Kitchen ESCITALOPRAM OXALATE 10 MG PO TABS   Oral   Take 5 mg by mouth daily.          . FUROSEMIDE 20 MG PO TABS   Oral   Take 20 mg by mouth daily.         Marland Kitchen LEVOTHYROXINE SODIUM 150 MCG PO TABS   Oral   Take 150 mcg by mouth daily. Sunday-friday         . LEVOTHYROXINE SODIUM 150 MCG PO TABS   Oral   Take 225 mcg by mouth daily. saturday         . METOPROLOL SUCCINATE ER 25 MG PO TB24   Oral   Take 25 mg by mouth daily.           . ADULT MULTIVITAMIN W/MINERALS CH   Oral   Take 1 tablet by mouth daily.         Marland Kitchen NITROGLYCERIN 0.4 MG SL SUBL   Sublingual   Place 0.4 mg under the tongue every 5 (five) minutes as needed. For chest pain         . OVER THE COUNTER  MEDICATION   Oral   Take 1 tablet by mouth daily. oscal ultra 600+D         . PANTOPRAZOLE SODIUM 40 MG PO TBEC   Oral   Take 40 mg by mouth daily.           Marland Kitchen POTASSIUM CHLORIDE CRYS ER 10 MEQ PO TBCR   Oral   Take 10 mEq by mouth daily.         Marland Kitchen RANOLAZINE ER 500 MG PO TB12   Oral   Take 500 mg by mouth 2 (two) times daily.         Marland Kitchen FORTEO [place]   Subcutaneous   Inject 20 mcg into the skin every evening.         Marland Kitchen VITAMIN C 500 MG PO TABS   Oral   Take 500 mg by mouth daily.         Marland Kitchen ZOLEDRONIC ACID 5 MG/100ML IV SOLN   Intravenous   Inject 5 mg into the vein. Yearly            BP 150/59  Pulse 69  Temp 98.8 F (37.1 C)  Resp 14  SpO2 97%  Physical Exam  Nursing note and vitals reviewed. Constitutional: She appears well-developed and well-nourished.  HENT:  Head: Normocephalic and atraumatic.  Eyes: Conjunctivae normal are normal. Pupils are equal, round, and reactive to light.  Neck: Neck supple. No tracheal deviation present. No thyromegaly present.  Cardiovascular: Normal rate and regular rhythm.   No murmur heard. Pulmonary/Chest: Effort normal and breath sounds normal.       Left posterior chest wall tender and ecchymotic  Abdominal: Soft. Bowel sounds are normal. She exhibits no distension. There is no tenderness.       Obese  Musculoskeletal: Normal range of motion. She exhibits no edema and no tenderness.       Entire spine nontender, stable.  Neurological: She is alert. No cranial nerve deficit. Coordination normal.  Skin: Skin is warm and dry. No rash noted.  Ecchymotic and tender at left flank left buttock ecchymotic and tender all ecchymoses are purplish red  Psychiatric: She has a normal mood and affect.    ED Course  Procedures (including critical care time) 545 p.m. pain is improved after treatment with intravenous hydromorphone.  Labs Reviewed  OCCULT BLOOD, POC DEVICE  BASIC METABOLIC PANEL  URINALYSIS, ROUTINE W  REFLEX MICROSCOPIC   No results found.   No diagnosis found.    Date: 07/30/2012  Rate: 70  Rhythm: Electronically paced  QRS Axis: normal  Intervals: normal  ST/T Wave abnormalities: nonspecific T wave changes  Conduction Disutrbances:none  Narrative Interpretation:   Old EKG Reviewed: Unchanged from 05/27/2010 interpreted by me Results for orders placed during the hospital encounter of 07/30/12  OCCULT BLOOD, POC DEVICE      Component Value Range   Fecal Occult Bld NEGATIVE  NEGATIVE  BASIC METABOLIC PANEL      Component Value Range   Sodium 141  135 - 145 mEq/L   Potassium 4.1  3.5 - 5.1 mEq/L   Chloride 99  96 - 112 mEq/L   CO2 29  19 - 32 mEq/L   Glucose, Bld 90  70 - 99 mg/dL   BUN 25 (*) 6 - 23 mg/dL   Creatinine, Ser 6.96 (*) 0.50 - 1.10 mg/dL   Calcium 29.5  8.4 - 28.4 mg/dL   GFR calc non Af Amer 37 (*) >90 mL/min   GFR calc Af Amer 43 (*) >90 mL/min  URINALYSIS, ROUTINE W REFLEX MICROSCOPIC      Component Value Range   Color, Urine YELLOW  YELLOW   APPearance CLEAR  CLEAR   Specific Gravity, Urine 1.009  1.005 - 1.030   pH 7.0  5.0 - 8.0   Glucose, UA NEGATIVE  NEGATIVE mg/dL   Hgb urine dipstick NEGATIVE  NEGATIVE   Bilirubin Urine NEGATIVE  NEGATIVE   Ketones, ur NEGATIVE  NEGATIVE mg/dL   Protein, ur NEGATIVE  NEGATIVE mg/dL   Urobilinogen, UA 0.2  0.0 - 1.0 mg/dL   Nitrite NEGATIVE  NEGATIVE   Leukocytes, UA TRACE (*) NEGATIVE  CBC WITH DIFFERENTIAL      Component Value Range   WBC 14.2 (*) 4.0 - 10.5 K/uL   RBC 4.04  3.87 - 5.11 MIL/uL   Hemoglobin 12.2  12.0 - 15.0 g/dL   HCT 13.2  44.0 - 10.2 %   MCV 91.3  78.0 - 100.0 fL   MCH 30.2  26.0 - 34.0 pg   MCHC 33.1  30.0 - 36.0 g/dL   RDW 72.5  36.6 - 44.0 %   Platelets 236  150 - 400 K/uL   Neutrophils Relative 81 (*) 43 - 77 %   Neutro Abs 11.4 (*) 1.7 - 7.7 K/uL   Lymphocytes Relative 9 (*) 12 - 46 %   Lymphs Abs 1.3  0.7 - 4.0 K/uL   Monocytes Relative 8  3 - 12 %   Monocytes Absolute  1.2 (*) 0.1 - 1.0 K/uL   Eosinophils Relative 1  0 - 5 %   Eosinophils Absolute 0.2  0.0 - 0.7 K/uL   Basophils Relative 0  0 - 1 %   Basophils Absolute 0.1  0.0 - 0.1 K/uL  URINE MICROSCOPIC-ADD ON      Component Value Range   Squamous Epithelial / LPF FEW (*) RARE   WBC, UA 3-6  <3 WBC/hpf   Bacteria, UA RARE  RARE   Dg Ribs Unilateral W/chest  Left  07/30/2012  *RADIOLOGY REPORT*  Clinical Data: Fall  LEFT RIBS AND CHEST - 3+ VIEW  Comparison: 06/21/2011  Findings: Three views left ribs submitted.  Status post median sternotomy.  Two leads cardiac pacemaker is unchanged in position. No acute infiltrate or pulmonary edema.  No left rib fracture is identified. No diagnostic pneumothorax.  IMPRESSION: No left rib fracture.  Status post median sternotomy.   Original Report Authenticated By: Natasha Mead, M.D.    Ct Head Wo Contrast  07/30/2012  *RADIOLOGY REPORT*  Clinical Data:  Fall, head and neck pain.  Atrial fibrillation. Pacemaker.  Hypertension peri  CT HEAD WITHOUT CONTRAST CT CERVICAL SPINE WITHOUT CONTRAST  Technique:  Multidetector CT imaging of the head and cervical spine was performed following the standard protocol without intravenous contrast.  Multiplanar CT image reconstructions of the cervical spine were also generated.  Comparison:  11/21/2010  CT HEAD  Findings: There is no evidence for acute infarction, intracranial hemorrhage, mass lesion, hydrocephalus, or extra-axial fluid. Moderate atrophy. Moderately advanced chronic microvascular ischemic change.  Remote large left MCA territory infarct affecting the temporal lobe.  Basal ganglia calcification.  Intact calvarium. Chronic sinus disease affects the left division sphenoid. Stable appearance from 11/21/2010.  IMPRESSION: Chronic changes as described.  No acute stroke or bleed.  CT CERVICAL SPINE  Findings: There is no evidence for cervical spine fracture or traumatic subluxation.  Multilevel spondylosis is present. Advanced disc  space narrowing C5-6 with 2 mm retrolisthesis. Osteopenia. There is no prevertebral soft tissue swelling or intraspinal hematoma.  Lung apices clear.  IMPRESSION: Spondylosis as described.  No acute findings.   Original Report Authenticated By: Davonna Belling, M.D.    Ct Cervical Spine Wo Contrast  07/30/2012  *RADIOLOGY REPORT*  Clinical Data:  Fall, head and neck pain.  Atrial fibrillation. Pacemaker.  Hypertension peri  CT HEAD WITHOUT CONTRAST CT CERVICAL SPINE WITHOUT CONTRAST  Technique:  Multidetector CT imaging of the head and cervical spine was performed following the standard protocol without intravenous contrast.  Multiplanar CT image reconstructions of the cervical spine were also generated.  Comparison:  11/21/2010  CT HEAD  Findings: There is no evidence for acute infarction, intracranial hemorrhage, mass lesion, hydrocephalus, or extra-axial fluid. Moderate atrophy. Moderately advanced chronic microvascular ischemic change.  Remote large left MCA territory infarct affecting the temporal lobe.  Basal ganglia calcification.  Intact calvarium. Chronic sinus disease affects the left division sphenoid. Stable appearance from 11/21/2010.  IMPRESSION: Chronic changes as described.  No acute stroke or bleed.  CT CERVICAL SPINE  Findings: There is no evidence for cervical spine fracture or traumatic subluxation.  Multilevel spondylosis is present. Advanced disc space narrowing C5-6 with 2 mm retrolisthesis. Osteopenia. There is no prevertebral soft tissue swelling or intraspinal hematoma.  Lung apices clear.  IMPRESSION: Spondylosis as described.  No acute findings.   Original Report Authenticated By: Davonna Belling, M.D.    Ct Abdomen Pelvis W Contrast  07/30/2012  *RADIOLOGY REPORT*  Clinical Data: 79 year old female with left flank, abdominal and pelvic pain following fall.  CT ABDOMEN AND PELVIS WITH CONTRAST  Technique:  Multidetector CT imaging of the abdomen and pelvis was performed following the  standard protocol during bolus administration of intravenous contrast.  Contrast: 80mL OMNIPAQUE IOHEXOL 300 MG/ML  SOLN  Comparison: 02/16/2011 thoracic radiographs  Findings: Cardiomegaly, cardiac surgical changes and pacemaker wires noted. A moderate to large hiatal hernia is identified.  The liver, spleen, adrenal glands, pancreas, gallbladder and kidneys are unremarkable  except for mild bilateral renal atrophy.  No free fluid, enlarged lymph nodes, biliary dilation or abdominal aortic aneurysm identified. Descending and sigmoid colonic diverticulosis noted without diverticulitis.  Nondisplaced fractures of the left 9th-11th ribs are noted. Subcutaneous stranding/hemorrhage overlying this region and within the left gluteal region identified. There is no evidence of pneumothorax.  Compression fractures of T12 and L1 are unchanged from 2012. Endplate compression fractures of L3 and L4 appear remote.  IMPRESSION: Fractures of the left 9th-11th ribs with overlying subcutaneous stranding/hemorrhage.  Subcutaneous stranding/hemorrhage within the left gluteal region as well.  No evidence of pneumothorax or acute intra abdominal or intrapelvic abnormality.  Moderate to large hiatal hernia and colonic diverticulosis.   Original Report Authenticated By: Harmon Pier, M.D.     MDM  Case discussed with trauma surgeon Dr. Janee Morn who will consult on this patient. Requests medical admission Case discussed with Dr. Susie Cassette who will arrange for 23 hour observation Diagnosis #1 multiple rib fractures #2 contusions multiple sites #3 minor head trauma #4 renal insufficiency        Doug Sou, MD 07/30/12 1754

## 2012-07-30 NOTE — Progress Notes (Signed)
@   8:30pm Called [employer] per Dr. Susie Cassette, [employer] staff stated they will be here tomorrow AM to interrogate pacemaker.

## 2012-07-30 NOTE — Progress Notes (Signed)
Notified MD once patient arrived to floor. (MD: Susie Cassette)

## 2012-07-30 NOTE — ED Notes (Signed)
Pt fell backwards hitting her left flank. Pt has fallen 3 x last week. Pt reports head injury Sunday when she fell last.
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
## 2012-07-30 NOTE — ED Notes (Signed)
Pt returned from CT °

## 2012-07-30 NOTE — ED Notes (Signed)
Patient transported to CT 
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
## 2012-07-30 NOTE — ED Notes (Signed)
Pt CT  

## 2012-07-31 DIAGNOSIS — I059 Rheumatic mitral valve disease, unspecified: Secondary | ICD-10-CM

## 2012-07-31 DIAGNOSIS — I4891 Unspecified atrial fibrillation: Secondary | ICD-10-CM

## 2012-07-31 DIAGNOSIS — S2249XA Multiple fractures of ribs, unspecified side, initial encounter for closed fracture: Secondary | ICD-10-CM | POA: Diagnosis present

## 2012-07-31 DIAGNOSIS — D62 Acute posthemorrhagic anemia: Secondary | ICD-10-CM

## 2012-07-31 LAB — COMPREHENSIVE METABOLIC PANEL
ALT: 17 U/L (ref 0–35)
Alkaline Phosphatase: 87 U/L (ref 39–117)
BUN: 26 mg/dL — ABNORMAL HIGH (ref 6–23)
CO2: 27 mEq/L (ref 19–32)
Calcium: 9.2 mg/dL (ref 8.4–10.5)
GFR calc Af Amer: 46 mL/min — ABNORMAL LOW (ref >90)
GFR calc non Af Amer: 40 mL/min — ABNORMAL LOW (ref >90)
Glucose, Bld: 106 mg/dL — ABNORMAL HIGH (ref 70–99)
Potassium: 3.5 mEq/L (ref 3.5–5.1)
Sodium: 138 mEq/L (ref 135–145)

## 2012-07-31 LAB — VITAMIN B12: Vitamin B-12: 949 pg/mL — ABNORMAL HIGH (ref 211–911)

## 2012-07-31 LAB — PROTIME-INR
INR: 1.07 (ref 0.00–1.49)
Prothrombin Time: 13.8 seconds (ref 11.6–15.2)

## 2012-07-31 LAB — SURGICAL PCR SCREEN
MRSA, PCR: NEGATIVE
Staphylococcus aureus: POSITIVE — AB

## 2012-07-31 LAB — CBC
MCHC: 33.3 g/dL (ref 30.0–36.0)
Platelets: 204 10*3/uL (ref 150–400)
RDW: 14.8 % (ref 11.5–15.5)
WBC: 9 10*3/uL (ref 4.0–10.5)

## 2012-07-31 LAB — RPR: RPR Ser Ql: NONREACTIVE

## 2012-07-31 MED ORDER — ENSURE COMPLETE PO LIQD
237.0000 mL | ORAL | Status: DC
  Administered 2012-08-01: 237 mL via ORAL

## 2012-07-31 MED ORDER — TRAMADOL HCL 50 MG PO TABS
50.0000 mg | ORAL_TABLET | Freq: Three times a day (TID) | ORAL | Status: DC | PRN
  Administered 2012-07-31 – 2012-08-01 (×3): 50 mg via ORAL
  Administered 2012-08-01: 100 mg via ORAL
  Administered 2012-08-02: 50 mg via ORAL
  Filled 2012-07-31: qty 2
  Filled 2012-07-31: qty 1
  Filled 2012-07-31: qty 2
  Filled 2012-07-31 (×2): qty 1

## 2012-07-31 NOTE — Progress Notes (Signed)
Patient ID: [name], female   DOB: 1930-05-11, 79 y.o.   MRN: [id number]   LOS: 1 day   Subjective: C/o left back pain.  Objective: Vital signs in last 24 hours: Temp:  [97.9 F (36.6 C)-98.8 F (37.1 C)] 97.9 F (36.6 C) (01/29 0626) Pulse Rate:  [69-70] 69  (01/29 0626) Resp:  [14-21] 18  (01/29 0626) BP: (105-159)/(40-69) 132/69 mmHg (01/29 0626) SpO2:  [93 %-97 %] 95 % (01/29 0626) Weight:  [207 lb 12.8 oz (94.257 kg)] 207 lb 12.8 oz (94.257 kg) (01/28 2319) Last BM Date: 07/30/12   Lab Results:  CBC  Basename 07/31/12 0600 07/30/12 1420  WBC 9.0 14.2*  HGB 10.9* 12.2  HCT 32.7* 36.9  PLT 204 236   BMET  Basename 07/31/12 0600 07/30/12 1420  NA 138 141  K 3.5 4.1  CL 100 99  CO2 27 29  GLUCOSE 106* 90  BUN 26* 25*  CREATININE 1.25* 1.32*  CALCIUM 9.2 10.4    General appearance: alert and no distress Resp: clear to auscultation bilaterally Cardio: regular rate and rhythm GI: normal findings: bowel sounds normal and soft, non-tender   Assessment/Plan: Multiple falls -- PT/OT consults pending. Unless falling issue can be corrected with some certainty, would recommend stopping Plavix as risk of serious/fatal TBI likely outweighs risk of CVA. Multiple left fib fxs -- Aggressive pulmonary toilet. Attempt pain control with non-narcotic medications. Would avoid NSAID's given apparent CKD. Will write for tramadol to start. Needs incentive spirometer. Check CXR in am. ABL anemia -- Mild, monitor. Multiple medical problems -- per primary service   Freeman Caldron, PA-C Pager: [phone] General Trauma PA Pager: [phone]   07/31/2012

## 2012-07-31 NOTE — Progress Notes (Signed)
Echocardiogram 2D Echocardiogram has been performed.  [name] 07/31/2012, 4:22 PM

## 2012-07-31 NOTE — Clinical Social Work Placement (Addendum)
Clinical Social Work Department  CLINICAL SOCIAL WORK PLACEMENT NOTE  07/31/12  Patient: [name]  Account Number:  [id number] Admit date: 07/30/12 Clinical Social Worker: Sabino Niemann LCSWA Date/time: 07/31/12  11:30 AM  Clinical Social Work is seeking post-discharge placement for this patient at the following level of care: SKILLED NURSING (*CSW will update this form in Epic as items are completed)  07/31/12  Patient/family provided with [facility] Department of Clinical Social Work's list of facilities offering this level of care within the geographic area requested by the patient (or if unable, by the patient's family).  07/31/12  Patient/family informed of their freedom to choose among providers that offer the needed level of care, that participate in Medicare, Medicaid or managed care program needed by the patient, have an available bed and are willing to accept the patient.  07/31/12 Patient/family informed of [facility]' ownership interest in [facility], as well as of the fact that they are under no obligation to receive care at this facility.  PASARR submitted to EDS on 07/31/12  PASARR number received from EDS on 07/31/12  FL2 transmitted to all facilities in geographic area requested by pt/family on 07/31/12  FL2 transmitted to all facilities within larger geographic area on  Patient informed that his/her managed care company has contracts with or will negotiate with certain facilities, including the following:  Patient/family informed of bed offers received:  Patient chooses bed at [facility] Physician recommends and patient chooses bed at  Patient to be transferred to on 08/03/12 Patient to be transferred to facility by Private vehicle The following physician request were entered in Epic:  Additional Comments:  Sabino Niemann, MSW,  Clinical Social Worker will sign off for now as social work intervention is no longer needed.  [phone]

## 2012-07-31 NOTE — Progress Notes (Signed)
[facility]             Progress Note   Subjective: C/o left sided chest wall pain. Says she feels very unsteady on her feet.  Objective: Vital signs in last 24 hours: Temp:  [97.9 F (36.6 C)-98.8 F (37.1 C)] 97.9 F (36.6 C) (01/29 0626) Pulse Rate:  [69-70] 70  (01/29 1142) Resp:  [17-21] 18  (01/29 0626) BP: (105-159)/(40-69) 132/69 mmHg (01/29 0626) SpO2:  [93 %-96 %] 95 % (01/29 0626) Weight:  [94.257 kg (207 lb 12.8 oz)] 94.257 kg (207 lb 12.8 oz) (01/28 2319) Weight change:  Last BM Date: 07/30/12  Intake/Output from previous day: 01/28 0701 - 01/29 0700 In: 337.5 [I.V.:337.5] Out: 575 [Urine:575]     Physical Exam: General: Alert, awake, oriented x3, in no acute distress. HEENT: No bruits, no goiter. Heart: Regular rate and rhythm, without murmurs, rubs, gallops. Lungs: Clear to auscultation bilaterally. Abdomen: Soft, nontender, nondistended, positive bowel sounds. Extremities: No clubbing cyanosis or edema with positive pedal pulses. Neuro: Grossly intact, nonfocal.    Lab Results: Basic Metabolic Panel:  Basename 07/31/12 0600 07/30/12 1812 07/30/12 1420  NA 138 -- 141  K 3.5 -- 4.1  CL 100 -- 99  CO2 27 -- 29  GLUCOSE 106* -- 90  BUN 26* -- 25*  CREATININE 1.25* -- 1.32*  CALCIUM 9.2 -- 10.4  MG -- 1.8 --  PHOS -- -- --   Liver Function Tests:  Basename 07/31/12 0600  AST 18  ALT 17  ALKPHOS 87  BILITOT 0.7  PROT 6.5  ALBUMIN 3.5   CBC:  Basename 07/31/12 0600 07/30/12 1420  WBC 9.0 14.2*  NEUTROABS -- 11.4*  HGB 10.9* 12.2  HCT 32.7* 36.9  MCV 90.3 91.3  PLT 204 236   Cardiac Enzymes:  Basename 07/31/12 0600 07/31/12 0003 07/30/12 1820  CKTOTAL -- -- --  CKMB -- -- --  CKMBINDEX -- -- --  TROPONINI <0.30 <0.30 <0.30   Thyroid Function Tests:  Basename 07/30/12 1812  TSH 0.771  T4TOTAL --  FREET4 --  T3FREE --  THYROIDAB --   Coagulation:  Basename 07/31/12 0600  LABPROT 13.8  INR 1.07    Urinalysis:  Basename 07/30/12 1422  COLORURINE YELLOW  LABSPEC 1.009  PHURINE 7.0  GLUCOSEU NEGATIVE  HGBUR NEGATIVE  BILIRUBINUR NEGATIVE  KETONESUR NEGATIVE  PROTEINUR NEGATIVE  UROBILINOGEN 0.2  NITRITE NEGATIVE  LEUKOCYTESUR TRACE*    Studies/Results: Dg Ribs Unilateral W/chest Left  07/30/2012  *RADIOLOGY REPORT*  Clinical Data: Fall  LEFT RIBS AND CHEST - 3+ VIEW  Comparison: 06/21/2011  Findings: Three views left ribs submitted.  Status post median sternotomy.  Two leads cardiac pacemaker is unchanged in position. No acute infiltrate or pulmonary edema.  No left rib fracture is identified. No diagnostic pneumothorax.  IMPRESSION: No left rib fracture.  Status post median sternotomy.   Original Report Authenticated By: Natasha Mead, M.D.    Ct Head Wo Contrast  07/30/2012  *RADIOLOGY REPORT*  Clinical Data:  Fall, head and neck pain.  Atrial fibrillation. Pacemaker.  Hypertension peri  CT HEAD WITHOUT CONTRAST CT CERVICAL SPINE WITHOUT CONTRAST  Technique:  Multidetector CT imaging of the head and cervical spine was performed following the standard protocol without intravenous contrast.  Multiplanar CT image reconstructions of the cervical spine were also generated.  Comparison:  11/21/2010  CT HEAD  Findings: There is no evidence for acute infarction, intracranial hemorrhage, mass lesion, hydrocephalus, or extra-axial fluid. Moderate atrophy. Moderately  advanced chronic microvascular ischemic change.  Remote large left MCA territory infarct affecting the temporal lobe.  Basal ganglia calcification.  Intact calvarium. Chronic sinus disease affects the left division sphenoid. Stable appearance from 11/21/2010.  IMPRESSION: Chronic changes as described.  No acute stroke or bleed.  CT CERVICAL SPINE  Findings: There is no evidence for cervical spine fracture or traumatic subluxation.  Multilevel spondylosis is present. Advanced disc space narrowing C5-6 with 2 mm retrolisthesis. Osteopenia.  There is no prevertebral soft tissue swelling or intraspinal hematoma.  Lung apices clear.  IMPRESSION: Spondylosis as described.  No acute findings.   Original Report Authenticated By: Davonna Belling, M.D.    Ct Cervical Spine Wo Contrast  07/30/2012  *RADIOLOGY REPORT*  Clinical Data:  Fall, head and neck pain.  Atrial fibrillation. Pacemaker.  Hypertension peri  CT HEAD WITHOUT CONTRAST CT CERVICAL SPINE WITHOUT CONTRAST  Technique:  Multidetector CT imaging of the head and cervical spine was performed following the standard protocol without intravenous contrast.  Multiplanar CT image reconstructions of the cervical spine were also generated.  Comparison:  11/21/2010  CT HEAD  Findings: There is no evidence for acute infarction, intracranial hemorrhage, mass lesion, hydrocephalus, or extra-axial fluid. Moderate atrophy. Moderately advanced chronic microvascular ischemic change.  Remote large left MCA territory infarct affecting the temporal lobe.  Basal ganglia calcification.  Intact calvarium. Chronic sinus disease affects the left division sphenoid. Stable appearance from 11/21/2010.  IMPRESSION: Chronic changes as described.  No acute stroke or bleed.  CT CERVICAL SPINE  Findings: There is no evidence for cervical spine fracture or traumatic subluxation.  Multilevel spondylosis is present. Advanced disc space narrowing C5-6 with 2 mm retrolisthesis. Osteopenia. There is no prevertebral soft tissue swelling or intraspinal hematoma.  Lung apices clear.  IMPRESSION: Spondylosis as described.  No acute findings.   Original Report Authenticated By: Davonna Belling, M.D.    Ct Abdomen Pelvis W Contrast  07/30/2012  *RADIOLOGY REPORT*  Clinical Data: 79 year old female with left flank, abdominal and pelvic pain following fall.  CT ABDOMEN AND PELVIS WITH CONTRAST  Technique:  Multidetector CT imaging of the abdomen and pelvis was performed following the standard protocol during bolus administration of intravenous  contrast.  Contrast: 80mL OMNIPAQUE IOHEXOL 300 MG/ML  SOLN  Comparison: 02/16/2011 thoracic radiographs  Findings: Cardiomegaly, cardiac surgical changes and pacemaker wires noted. A moderate to large hiatal hernia is identified.  The liver, spleen, adrenal glands, pancreas, gallbladder and kidneys are unremarkable except for mild bilateral renal atrophy.  No free fluid, enlarged lymph nodes, biliary dilation or abdominal aortic aneurysm identified. Descending and sigmoid colonic diverticulosis noted without diverticulitis.  Nondisplaced fractures of the left 9th-11th ribs are noted. Subcutaneous stranding/hemorrhage overlying this region and within the left gluteal region identified. There is no evidence of pneumothorax.  Compression fractures of T12 and L1 are unchanged from 2012. Endplate compression fractures of L3 and L4 appear remote.  IMPRESSION: Fractures of the left 9th-11th ribs with overlying subcutaneous stranding/hemorrhage.  Subcutaneous stranding/hemorrhage within the left gluteal region as well.  No evidence of pneumothorax or acute intra abdominal or intrapelvic abnormality.  Moderate to large hiatal hernia and colonic diverticulosis.   Original Report Authenticated By: Harmon Pier, M.D.     Medications: Scheduled Meds:   . acidophilus  1 capsule Oral Daily  . amiodarone  200 mg Oral Q M,W,F  . aspirin EC  81 mg Oral Daily  . clopidogrel  75 mg Oral Daily  . escitalopram  5 mg  Oral Daily  . furosemide  20 mg Oral Daily  . levothyroxine  150 mcg Oral QAC breakfast  . metoprolol succinate  25 mg Oral Daily  . multivitamin with minerals  1 tablet Oral Daily  . pantoprazole  40 mg Oral Daily  . potassium chloride  10 mEq Oral Daily  . ranolazine  500 mg Oral BID  . sodium chloride  3 mL Intravenous Q12H   Continuous Infusions:   . sodium chloride 50 mL/hr at 07/30/12 1815   PRN Meds:.acetaminophen, acetaminophen, levalbuterol, ondansetron (ZOFRAN) IV, ondansetron,  traMADol  Assessment/Plan:  Principal Problem:  *Falls Active Problems:  DYSLIPIDEMIA  HYPERTENSION  Atrial fibrillation  Sick sinus syndrome  GERD  Multiple rib fractures  Acute blood loss anemia   Frequent Falls -PT recs CIR consult. -Will also alert SW in case SNF required. -Will check TSH/B12/RPR.  Rib Fractures -From most recent fall. -Pain meds PRN.  Atrial Fibrillation -Not anticoagulated. -Rate controlled.  H/o TIAs -On Plavix for this reason. -After discussion with patient, we have decided to discontinue it at this time given multiple falls and risk of bleeding.  Disposition -CIR vs SNF.   Time spent coordinating care: 35 minutes   LOS: 1 day   [facility] Pager: [phone] 07/31/2012, 2:20 PM

## 2012-07-31 NOTE — Progress Notes (Signed)
INITIAL NUTRITION ASSESSMENT  DOCUMENTATION CODES Per approved criteria  -obesity grade 1   INTERVENTION: Ensure Complete once daily  NUTRITION DIAGNOSIS: Inadequate oral intake related to decreased appetite as evidenced by documented decreased intake.   Goal: PO intake to meet >90% estimated needs  Monitor:  PO intake, labs, weight trend  Reason for Assessment: MST  79 y.o. female  Admitting Dx: Falls  ASSESSMENT: Patient reports good intake but with weight loss from 228-207 lbs in the past 3 months.  Daughter also confirmed good intake.  Patient could not answer questions well at times.    Height: Ht Readings from Last 1 Encounters:  07/30/12 5\' 8"  (1.727 m)    Weight: Wt Readings from Last 1 Encounters:  07/30/12 207 lb 12.8 oz (94.257 kg)    Ideal Body Weight: 140 lbs  % Ideal Body Weight: 148  Wt Readings from Last 10 Encounters:  07/30/12 207 lb 12.8 oz (94.257 kg)  04/29/12 228 lb 6.4 oz (103.602 kg)  03/07/12 225 lb 12 oz (102.4 kg)  01/27/12 231 lb 0.7 oz (104.8 kg)  08/21/11 234 lb 12 oz (106.482 kg)  02/22/11 233 lb 6.4 oz (105.87 kg)  09/14/10 220 lb (99.791 kg)  04/22/10 215 lb (97.523 kg)    Usual Body Weight: 228 lbs  % Usual Body Weight: 91  BMI:  Body mass index is 31.60 kg/(m^2).-obesity grade 1  Estimated Nutritional Needs: Kcal: 1600-1800 Protein: 90-100 gm Fluid: >1.8L  Skin: wnl  Diet Order: Cardiac  EDUCATION NEEDS: -No education needs identified at this time   Intake/Output Summary (Last 24 hours) at 07/31/12 1759 Last data filed at 07/31/12 1200  Gross per 24 hour  Intake  607.5 ml  Output    775 ml  Net -167.5 ml     Labs:   Lab 07/31/12 0600 07/30/12 1812 07/30/12 1420  NA 138 -- 141  K 3.5 -- 4.1  CL 100 -- 99  CO2 27 -- 29  BUN 26* -- 25*  CREATININE 1.25* -- 1.32*  CALCIUM 9.2 -- 10.4  MG -- 1.8 --  PHOS -- -- --  GLUCOSE 106* -- 90    CBG (last 3)  No results found for this basename:  GLUCAP:3 in the last 72 hours  Scheduled Meds:   . acidophilus  1 capsule Oral Daily  . amiodarone  200 mg Oral Q M,W,F  . aspirin EC  81 mg Oral Daily  . escitalopram  5 mg Oral Daily  . furosemide  20 mg Oral Daily  . levothyroxine  150 mcg Oral QAC breakfast  . metoprolol succinate  25 mg Oral Daily  . multivitamin with minerals  1 tablet Oral Daily  . pantoprazole  40 mg Oral Daily  . potassium chloride  10 mEq Oral Daily  . ranolazine  500 mg Oral BID  . sodium chloride  3 mL Intravenous Q12H    Continuous Infusions:   . sodium chloride 50 mL/hr at 07/31/12 1652    Past Medical History  Diagnosis Date  . CHF (congestive heart failure)   . Hypertension   . CVA (cerebral vascular accident)   . Hypothyroidism   . COPD (chronic obstructive pulmonary disease)   . Sick sinus syndrome     status post pacemaker implantation  . Paroxysmal atrial fibrillation   . Subendocardial myocardial infarction   . Subdural hematoma   . CAD (coronary artery disease)     post PTCA and stenting of the LAD and Status  post maze procedure  . History of gastrointestinal bleeding   . GERD (gastroesophageal reflux disease)   . Osteoporosis     Past Surgical History  Procedure Date  . Coronary artery bypass graft 06/2009  . Cardiac catheterization 08/17/2009    Est. EF is 35% to 40%  . Pacemaker insertion     MDT implanted by Dr Reyes Ivan for SSS    Oran Rein, RD, LDN Clinical Inpatient Dietitian Pager:  [phone] Weekend and after hours pager:  [phone]

## 2012-07-31 NOTE — Progress Notes (Signed)
Utilization review completed. Kainan Patty, RN, BSN. 

## 2012-07-31 NOTE — Clinical Social Work Psychosocial (Addendum)
Clinical Social Work Department  BRIEF PSYCHOSOCIAL ASSESSMENT  Patient: [name]  Account Number: [id number] Admit date: 07/29/12 Clinical Social Worker Sharni Negron Riley Kill, MSW Date/Time: 3:30 PM Referred by: Physician Date Referred: 07/30/12 Referred for   SNF Placement   Other Referral:  Interview type: Patient/ Patient's niece was present Other interview type:PSYCHOSOCIAL DATA  Living Status: Patient lives with her husband Admitted from facility:  Level of care:  Primary support name: Pinkhasov,L B  Primary support relationship to patient: Husband Degree of support available:  Strong and vested  CURRENT CONCERNS  Current Concerns   Post-Acute Placement   Other Concerns:  SOCIAL WORK ASSESSMENT / PLAN  CSW met with pt re: PT recommendation for SNF.   Pt lives with husband who is having trouble taking care of the patient. Patient has had frequent falls  CSW explained placement process and answered questions.   Pt reports [facility] is her preference    CSW completed FL2 and initiated SNF search.     Assessment/plan status: Information/Referral to [facility]  Other assessment/ plan:  Information/referral to community resources:  SNF   PTAR   PATIENT'S/FAMILY'S RESPONSE TO PLAN OF CARE:  Pt  And patient's niece Report they are agreeable to ST SNF in order to increase strength and independence with mobility prior to return home  Pt verbalized understanding of placement process and appreciation for CSW assist.   Sabino Niemann, MSW [phone]

## 2012-07-31 NOTE — Progress Notes (Signed)
Rehab Admissions Coordinator Note:  Patient was screened by Trish Mage for appropriateness for an Inpatient Acute Rehab Consult.  Noted PT recommending CIR.   At this time, we are recommending Inpatient Rehab consult.  Lelon Frohlich M 07/31/2012, 1:29 PM  I can be reached at [phone].

## 2012-07-31 NOTE — Progress Notes (Signed)
Patient with pain but does not seem to be in significant distress.  This patient has been seen and I agree with the findings and treatment plan.  Marta Lamas. Gae Bon, MD, FACS [phone] (pager) [phone] (direct pager) Trauma Surgeon

## 2012-07-31 NOTE — Progress Notes (Signed)
Physical medicine and rehabilitation consult requested. Patient lacks medical necessity for inpatient rehabilitation services. Recommendations home health therapies versus skilled facility

## 2012-07-31 NOTE — Evaluation (Signed)
Physical Therapy Evaluation Patient Details Name: [name] MRN: [id number] DOB: [birth year] Today's Date: 07/31/2012 Time: 1125-1225 PT Time Calculation (min): 60 min  PT Assessment / Plan / Recommendation Clinical Impression  79 y.o. female admitted to [facility] for fall with left side multiple rib fractures.  This pt per her niece has fallen 3 times in the past 2 weeks.  The pt is sore with movement and requires mod assist to get to her feet.  She is unsafe in the use of her RW during mobility and would benefit from further PT to get her stronger so that she can prevent further falls and injuries at home.  She would be a great candidate for inpatient rehabilitation.      PT Assessment  Patient needs continued PT services    Follow Up Recommendations  CIR    Does the patient have the potential to tolerate intense rehabilitation    yes  Barriers to Discharge  none      Equipment Recommendations  None recommended by PT    Recommendations for Other Services Rehab consult   Frequency Min 3X/week    Precautions / Restrictions Precautions Precautions: Fall Precaution Comments: 3 falls in past 2 weeks Restrictions Weight Bearing Restrictions: No   Pertinent Vitals/Pain Reports rib pain, unable to rate 4/10 faces scale, informed RN pt requesting pain meds.        Mobility  Bed Mobility Bed Mobility: Supine to Sit;Sitting - Scoot to Edge of Bed Supine to Sit: 3: Mod assist;With rails;HOB elevated Sitting - Scoot to Edge of Bed: 3: Mod assist;With rail Details for Bed Mobility Assistance: mod assist to support trunk to get to EOB.  Pt has bed rail at home, but per niece (over the phone) pt no longer sleeps in the bed, but in her lift recliner chair.   Transfers Transfers: Sit to Stand;Stand to [employer] Transfers Sit to Stand: 3: Mod assist;With upper extremity assist;With armrests;From bed;From chair/3-in-1 Stand to Sit: 3: Mod assist;With upper extremity assist;With  armrests;To chair/3-in-1 Stand Pivot Transfers: 4: Min assist;With armrests Details for Transfer Assistance: mod assist to get to standing from lower surface (recliner chair), min assist from bed (elevated), once standing with RW min assist to steady pt for balacne while transferring to both the recliner and the 3-in-1 BSC.  Incontinent bladder when going to stand to get to 3-in-1, but pt able to report she needed to go.  Reports she wears depends at home. Ambulation/Gait Ambulation/Gait Assistance: 4: Min assist Ambulation Distance (Feet): 5 Feet Assistive device: Rolling walker Ambulation/Gait Assistance Details: min assist to steday pt for balance.  Pt is very unsafe in her use of the RW (leaving it "parked" several feet away when going to back up to sit in chair.   Gait Pattern: Step-through pattern;Shuffle;Trunk flexed Gait velocity: less than 1.8 ft/sec which indicates risk for recurrent falls           PT Diagnosis: Difficulty walking;Abnormality of gait;Generalized weakness;Acute pain  PT Problem List: Decreased strength;Decreased mobility;Decreased balance;Decreased activity tolerance;Decreased coordination;Decreased cognition;Decreased knowledge of use of DME;Decreased safety awareness;Pain;Obesity PT Treatment Interventions: DME instruction;Gait training;Stair training;Functional mobility training;Therapeutic activities;Therapeutic exercise;Balance training;Neuromuscular re-education;Cognitive remediation;Patient/family education;Wheelchair mobility training   PT Goals Acute Rehab PT Goals PT Goal Formulation: With patient/family Time For Goal Achievement: 08/14/12 Potential to Achieve Goals: Good Pt will go Supine/Side to Sit: with supervision;with HOB 0 degrees;with rail PT Goal: Supine/Side to Sit - Progress: Goal set today Pt will go Sit to Supine/Side: with  supervision;with HOB 0 degrees;with rail PT Goal: Sit to Supine/Side - Progress: Goal set today Pt will go Sit to  Stand: with supervision;with upper extremity assist PT Goal: Sit to Stand - Progress: Goal set today Pt will go Stand to Sit: with supervision;with upper extremity assist PT Goal: Stand to Sit - Progress: Goal set today Pt will Ambulate: 51 - 150 feet;with rolling walker PT Goal: Ambulate - Progress: Goal set today  Visit Information  Last PT Received On: 07/31/12 Assistance Needed: +1    Subjective Data  Subjective: Pt having expressive difficulties during history.  Found out later this we premorbid per niece.   Patient Stated Goal: to stop falling   Prior Functioning  Home Living Lives With: Spouse Available Help at Discharge: Family;Available 24 hours/day;Other (Comment) (husband doesn't like to stay inside.  ) Type of Home: House Home Access: Ramped entrance Home Layout: One level Bathroom Shower/Tub: Tub/shower unit;Walk-in shower;Door;Curtain (uses the tub shower with bench) Home Adaptive Equipment: Shower chair with back;Walker - four wheeled;Bedside commode/3-in-1 (lift chair) Additional Comments: bed rail on side of bed, but chooes to sleep in her lift chair Prior Function Level of Independence: Needs assistance Needs Assistance: Light Housekeeping;Meal Prep;Bathing;Dressing;Grooming Able to Take Stairs?: No Driving: No Comments: pt falls when she chooes to do things unassisted- husband and niece try to help, but she often gets up by herself.   Communication Communication:  (niece reports this was present PTA) Dominant Hand: Right    Cognition  Overall Cognitive Status: History of cognitive impairments - at baseline Area of Impairment: Memory    Extremity/Trunk Assessment Right Lower Extremity Assessment RLE ROM/Strength/Tone: Deficits RLE ROM/Strength/Tone Deficits: grossly 3+/5, h/o R TKA  Left Lower Extremity Assessment LLE ROM/Strength/Tone: Deficits LLE ROM/Strength/Tone Deficits: grossly 3+/5 per functional assessment   Balance Static Sitting  Balance Static Sitting - Balance Support: Bilateral upper extremity supported;Feet supported Static Sitting - Level of Assistance: 5: Stand by assistance Static Sitting - Comment/# of Minutes: 3 mins while preparing to get to recliner and while taking med from Research scientist (life sciences) Standing - Balance Support: Bilateral upper extremity supported Static Standing - Level of Assistance: 4: Min assist Static Standing - Comment/# of Minutes: min assist to steady pt with RW  End of Session PT - End of Session Activity Tolerance: Patient limited by pain Patient left: in chair;with call bell/phone within reach  GP Functional Assessment Tool Used: clinical experience Functional Limitation: Mobility: Walking and moving around Mobility: Walking and Moving Around Current Status [phone]): At least 40 percent but less than 60 percent impaired, limited or restricted Mobility: Walking and Moving Around Goal Status [phone]): At least 20 percent but less than 40 percent impaired, limited or restricted   Kameelah Minish B. Demira Gwynne, PT, DPT [phone]   07/31/2012, 12:27 PM

## 2012-08-01 ENCOUNTER — Inpatient Hospital Stay (HOSPITAL_COMMUNITY): Payer: Medicare Other

## 2012-08-01 LAB — CBC
HCT: 32.1 % — ABNORMAL LOW (ref 36.0–46.0)
Hemoglobin: 10.7 g/dL — ABNORMAL LOW (ref 12.0–15.0)
MCHC: 33.3 g/dL (ref 30.0–36.0)
MCV: 90.2 fL (ref 78.0–100.0)

## 2012-08-01 LAB — BASIC METABOLIC PANEL
BUN: 26 mg/dL — ABNORMAL HIGH (ref 6–23)
GFR calc non Af Amer: 42 mL/min — ABNORMAL LOW (ref >90)
Glucose, Bld: 105 mg/dL — ABNORMAL HIGH (ref 70–99)
Potassium: 3.5 mEq/L (ref 3.5–5.1)

## 2012-08-01 MED ORDER — TRAMADOL HCL 50 MG PO TABS: 50.0000 mg | ORAL_TABLET | Freq: Three times a day (TID) | ORAL | Status: DC | PRN

## 2012-08-01 MED ORDER — MUPIROCIN 2 % EX OINT
TOPICAL_OINTMENT | Freq: Two times a day (BID) | CUTANEOUS | Status: DC
  Administered 2012-08-01 – 2012-08-02 (×2): via NASAL
  Filled 2012-08-01 (×3): qty 22

## 2012-08-01 MED ORDER — CHLORHEXIDINE GLUCONATE CLOTH 2 % EX PADS
6.0000 | MEDICATED_PAD | Freq: Every day | CUTANEOUS | Status: DC
  Administered 2012-08-02: 6 via TOPICAL

## 2012-08-01 NOTE — Plan of Care (Signed)
Problem: Phase II Progression Outcomes Goal: Discharge plan established Recommend SNF for further therapy needs after acute care d/c, HH if not going to SNF
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
## 2012-08-01 NOTE — Evaluation (Signed)
Occupational Therapy Evaluation Patient Details Name: [name] MRN: [id number] DOB: 08/16/1929 Today's Date: 08/01/2012 Time: 4098-1191 OT Time Calculation (min): 26 min  OT Assessment / Plan / Recommendation Clinical Impression  Pt demos decline in function with ADLs, safety,activity tolerance. Pt's previously required A at home for ADLs due to cognitive/safety and balance deficits. Pt's family planning to d/c to a SNF or return home with caregivers. Pt not approriate for acute OT services at this time. OT will sign off    OT Assessment  All further OT needs can be met in the next venue of care    Follow Up Recommendations  SNF;Other (comment) (HH if unable to get into SNF)    Barriers to Discharge      Equipment Recommendations  None recommended by OT    Recommendations for Other Services    Frequency       Precautions / Restrictions Precautions Precautions: Fall Precaution Comments: multiple falls at home Restrictions Weight Bearing Restrictions: No   Pertinent Vitals/Pain     ADL  Eating/Feeding: Performed;Set up Where Assessed - Eating/Feeding: Chair Grooming: Performed;Wash/dry hands;Wash/dry face;Brushing hair;Supervision/safety;Set up Where Assessed - Grooming: Supported sitting Upper Body Bathing: Simulated;Minimal assistance Where Assessed - Upper Body Bathing: Supported sitting Lower Body Bathing: +1 Total assistance Upper Body Dressing: Performed;Minimal assistance Lower Body Dressing: +1 Total assistance Toilet Transfer: Other (comment) (declined mobility due to rib are pain)    OT Diagnosis: Generalized weakness  OT Problem List: Decreased strength;Decreased cognition;Decreased activity tolerance;Decreased safety awareness;Decreased knowledge of use of DME or AE;Decreased knowledge of precautions;Pain OT Treatment Interventions:     OT Goals    Visit Information  Last OT Received On: 08/01/12    Subjective Data  Subjective: " I don;t know  what the plan is " Patient Stated Goal: To d/c to SNF for further rehab per pt's family   Prior Functioning     Home Living Lives With: Spouse Available Help at Discharge: Family;Available 24 hours/day Type of Home: House Home Access: Ramped entrance Home Layout: One level Bathroom Shower/Tub: Tub/shower unit;Walk-in shower;Curtain;Door Bathroom Toilet: Handicapped height Bathroom Accessibility: Yes How Accessible: Accessible via walker;Accessible via wheelchair Home Adaptive Equipment: Shower chair with back;Walker - four wheeled;Bedside commode/3-in-1 Additional Comments: rail for bed, lift chair Prior Function Level of Independence: Needs assistance Needs Assistance: Bathing;Dressing;Grooming;Toileting;Light Housekeeping;Meal Prep;Gait;Transfers Transfer Assistance: pt has falls when performing mobility unassisted, family tries to assist, but pt gets up on her own at times Able to Take Stairs?: No Driving: No Vocation: Retired Musician: No difficulties Dominant Hand: Right         Vision/Perception Vision - Assessment Eye Alignment: Within Chemical engineer Perception: Within Functional Limits   Cognition  Overall Cognitive Status: History of cognitive impairments - at baseline Area of Impairment: Memory;Attention    Extremity/Trunk Assessment Right Upper Extremity Assessment RUE ROM/Strength/Tone: [facility] for tasks assessed Left Upper Extremity Assessment LUE ROM/Strength/Tone: WFL for tasks assessed     Mobility Bed Mobility Bed Mobility: Not assessed Transfers Transfers: Not assessed (Pt declined mobility due to rib area pain)               Balance Balance Balance Assessed: No   End of Session OT - End of Session Activity Tolerance: Patient limited by pain Patient left: in chair;with call bell/phone within reach;with family/visitor present  GO     Galen Manila 08/01/2012, 3:52 PM

## 2012-08-01 NOTE — Progress Notes (Signed)
Up on bedside commode.  Continue pulmonary toilet and IS.  F/U CXR for LLL ATX effuison associated with rib FXs.  Delirium W/U per primary team. Patient examined and I agree with the assessment and plan  Violeta Gelinas, MD, MPH, FACS Pager: [phone]  08/01/2012 9:15 AM

## 2012-08-01 NOTE — Progress Notes (Signed)
Patient ID: [name], female   DOB: 1930/01/18, 79 y.o.   MRN: [id number]   LOS: 2 days   Subjective: Agitated and perseverating on the status of some family members. Upset because staff won't tell her anything. She expresses understanding that there's no way for the staff to know that information but then comes right back to insisting we need to tell her about them. Oriented to person, place, situation.  Objective: Vital signs in last 24 hours: Temp:  [97.6 F (36.4 C)-98.9 F (37.2 C)] 98.9 F (37.2 C) (01/30 0648) Pulse Rate:  [70-75] 75  (01/30 0648) Resp:  [20] 20  (01/30 0648) BP: (138-150)/(78-88) 150/86 mmHg (01/30 0648) SpO2:  [97 %-99 %] 99 % (01/30 0648) Last BM Date: 07/30/12   Lab Results:  CBC  Basename 08/01/12 0555 07/31/12 0600  WBC 8.7 9.0  HGB 10.7* 10.9*  HCT 32.1* 32.7*  PLT 197 204   BMET  Basename 08/01/12 0555 07/31/12 0600  NA 140 138  K 3.5 3.5  CL 102 100  CO2 26 27  GLUCOSE 105* 106*  BUN 26* 26*  CREATININE 1.19* 1.25*  CALCIUM 8.8 9.2    General appearance: alert and mild distress Resp: clear to auscultation bilaterally Cardio: regular rate and rhythm GI: Soft, mild diffuse TTP, +BS Pulses: 2+ and symmetric   Assessment/Plan: Multiple falls  Multiple left fib fxs -- Aggressive pulmonary toilet. Attempt pain control with non-narcotic medications. Would avoid NSAID's given apparent CKD. Needs incentive spirometer. Check CXR in am.  ABL anemia -- Stable Multiple medical problems -- per primary service    Freeman Caldron, PA-C Pager: [phone] General Trauma PA Pager: [phone]   08/01/2012

## 2012-08-01 NOTE — Clinical Documentation Improvement (Signed)
CKD DOCUMENTATION CLARIFICATION QUERY   THIS DOCUMENT IS NOT A PERMANENT PART OF THE MEDICAL RECORD  TO RESPOND TO THE THIS QUERY, FOLLOW THE INSTRUCTIONS BELOW:  1. If needed, update documentation for the patient's encounter via the notes activity.  2. Access this query again and click edit on the In [employer].  3. After updating, or not, click F2 to complete all highlighted (required) fields concerning your review. Select "additional documentation in the medical record" OR "no additional documentation provided".  4. Click Sign note button.  5. The deficiency will fall out of your In Basket *Please let us know if you are not able to complete this workflow by phone or e-mail (listed below).  Please update your documentation within the medical record to reflect your response to this query.                                                                                        08/01/12   Dear Dr. Dorris Carnes. [facility],  In a better effort to capture your patient's severity of illness, reflect appropriate length of stay and utilization of resources, a review of the patient medical record has revealed the following indicators.    Based on your clinical judgment, please clarify and document in a progress note and/or discharge summary the clinical condition associated with the following supporting information:  In responding to this query please exercise your independent judgment.  The fact that a query is asked, does not imply that any particular answer is desired or expected.   Noted being documented "renal insufficiency" and "ckd" if possible state the stage of the CKD if known.  Thank you   Possible Clinical Conditions?   CKD Stage I -  GFR > OR = 90  CKD Stage II - GFR 60-80  CKD Stage III - GFR 30-59  CKD Stage IV - GFR 15-29  CKD Stage V - GFR < 15  Other condition  Cannot Clinically determine    Lab:  Creatinine level (baseline and current)1.19 (H) 1.25 (H) 1.32 (H)  1.18   Calculated GFR: GFR calc non Af Amer >90 mL/min 42 (L) 90 mL/min" 40 (L) 90 mL/min" 37 (L) 90 mL/min" 42    Treatment: NS @ 75 ml/hr, then decreased to 50 ml/hr  You may use possible, probable, or suspect with inpatient documentation. possible, probable, suspected diagnoses MUST be documented at the time of discharge  Reviewed: no response from Dr. Jeanella Anton pt dcd   Thank You,  Lavonda Jumbo  Clinical Documentation Specialist, BSN: Pager [phone]  Health Information Management [place]

## 2012-08-02 ENCOUNTER — Inpatient Hospital Stay (HOSPITAL_COMMUNITY): Payer: Medicare Other

## 2012-08-02 DIAGNOSIS — S2249XA Multiple fractures of ribs, unspecified side, initial encounter for closed fracture: Secondary | ICD-10-CM

## 2012-08-02 MED ORDER — TRAMADOL HCL 50 MG PO TABS: 25.0000 mg | ORAL_TABLET | Freq: Four times a day (QID) | ORAL | Status: DC | PRN

## 2012-08-02 MED ORDER — TRAMADOL HCL 50 MG PO TABS
50.0000 mg | ORAL_TABLET | Freq: Four times a day (QID) | ORAL | Status: DC
  Administered 2012-08-02: 50 mg via ORAL
  Filled 2012-08-02: qty 1

## 2012-08-02 NOTE — Progress Notes (Signed)
Physical Therapy Treatment Patient Details Name: [name] MRN: [id number] DOB: 10/21/1929 Today's Date: 08/02/2012 Time: 4098-1191 PT Time Calculation (min): 25 min  PT Assessment / Plan / Recommendation Comments on Treatment Session  Assisted pt to 3-in-1, pt was incontinent of urine in standing and had posterior loss of balance standing with RW, mod A required to prevent fall. Pt ambulated 5' with RW and mod A for balance, distance limited by fatigue.    Follow Up Recommendations  CIR     Does the patient have the potential to tolerate intense rehabilitation     Barriers to Discharge        Equipment Recommendations  None recommended by PT    Recommendations for Other Services Rehab consult  Frequency Min 3X/week   Plan Discharge plan remains appropriate;Frequency remains appropriate    Precautions / Restrictions Precautions Precautions: Fall Precaution Comments: multiple falls at home Restrictions Weight Bearing Restrictions: No   Pertinent Vitals/Pain **pt reports L sided rib pain with movement, unable to rate it*    Mobility  Bed Mobility Bed Mobility: Supine to Sit;Sitting - Scoot to Edge of Bed Supine to Sit: 3: Mod assist;With rails;HOB elevated Sitting - Scoot to Edge of Bed: 3: Mod assist;With rail Details for Bed Mobility Assistance: mod assist to support trunk to get to EOB.   Transfers Transfers: Sit to Stand;Stand to [employer] Transfers Sit to Stand: 3: Mod assist;With upper extremity assist;With armrests;From bed;From chair/3-in-1 Stand to Sit: 3: Mod assist;With upper extremity assist;With armrests;To chair/3-in-1 Stand Pivot Transfers: 4: Min assist;With armrests Details for Transfer Assistance: mod assist to get to standing, once standing with RW mod assist to steady pt for posterior LOB balance while transferring to both the recliner and the 3-in-1 BSC.  Incontinent bladder when going to stand to get to 3-in-1, but pt able to report she  needed to go.  Reports she wears depends at home. Ambulation/Gait Ambulation/Gait Assistance: 3: Mod assist Ambulation Distance (Feet): 5 Feet Assistive device: Rolling walker Gait Pattern: Step-through pattern;Shuffle;Trunk flexed Gait velocity: less than 1.8 ft/sec which indicates risk for recurrent falls General Gait Details: distance limited by fatigue and incontinence of bladder, mod A for balance    Exercises Total Joint Exercises Long Arc Quad: AAROM;Both;10 reps;Seated   PT Diagnosis:    PT Problem List:   PT Treatment Interventions:     PT Goals Acute Rehab PT Goals PT Goal Formulation: With patient Time For Goal Achievement: 08/14/12 Potential to Achieve Goals: Good Pt will go Supine/Side to Sit: with supervision;with HOB 0 degrees;with rail PT Goal: Supine/Side to Sit - Progress: Not progressing Pt will go Sit to Supine/Side: with supervision;with HOB 0 degrees;with rail Pt will go Sit to Stand: with supervision;with upper extremity assist PT Goal: Sit to Stand - Progress: Not progressing Pt will go Stand to Sit: with supervision;with upper extremity assist PT Goal: Stand to Sit - Progress: Not progressing Pt will Ambulate: 51 - 150 feet;with rolling walker PT Goal: Ambulate - Progress: Not progressing  Visit Information  Last PT Received On: 08/02/12 Assistance Needed: +2    Subjective Data  Subjective: I go (urinate) whenever I stand up.  Patient Stated Goal: stop falling   Cognition  Overall Cognitive Status: History of cognitive impairments - at baseline Area of Impairment: Memory;Attention Arousal/Alertness: Awake/alert Orientation Level: Appears intact for tasks assessed Behavior During Session: [facility] for tasks performed    Balance  Static Sitting Balance Static Sitting - Balance Support: Bilateral upper extremity  supported;Feet supported Static Sitting - Level of Assistance: 5: Stand by assistance Static Sitting - Comment/# of Minutes: 2 Static  Standing Balance Static Standing - Balance Support: Bilateral upper extremity supported Static Standing - Level of Assistance: 4: Min assist;3: Mod assist Static Standing - Comment/# of Minutes: 1 - pt has posterior LOB in standing, pt reports this is baseline  End of Session PT - End of Session Activity Tolerance: Patient limited by fatigue Patient left: in chair;with call bell/phone within reach   GP     Tamala Ser 08/02/2012, 10:28 AM [phone]

## 2012-08-02 NOTE — Progress Notes (Signed)
Patient's SNF discharge was delayed 1 day due to bed availability. Patient is ready for DC today. No changes to DC diagnoses or medications.  Peggye Pitt, MD [facility] Pager: [phone]

## 2012-08-02 NOTE — Progress Notes (Signed)
This patient has been seen and I agree with the findings and treatment plan.  Lemarcus Baggerly O. Renesmay Nesbitt, III, MD, FACS [phone] (pager) [phone] (direct pager) Trauma Surgeon  

## 2012-08-02 NOTE — Progress Notes (Signed)
Patient ID: [name], female   DOB: 11-27-1929, 79 y.o.   MRN: [id number]   LOS: 3 days   Subjective: C/o pain. Less agitated this am.  Objective: Vital signs in last 24 hours: Temp:  [97.2 F (36.2 C)-98.5 F (36.9 C)] 98.3 F (36.8 C) (01/31 0712) Pulse Rate:  [65-70] 65  (01/31 0712) Resp:  [20] 20  (01/31 0712) BP: (108-129)/(49-59) 112/50 mmHg (01/31 0712) SpO2:  [96 %-98 %] 98 % (01/31 0712) Last BM Date: 07/30/12   IS:   Radiology CXR: NSC (official read pending)   General appearance: alert and no distress Resp: clear to auscultation bilaterally Cardio: regular rate and rhythm   Assessment/Plan: Multiple falls  Multiple left fib fxs -- Aggressive pulmonary toilet. Given low pain med usage over last 48h will schedule some baseline meds to see if that will improve control. ABL anemia -- Stable  Multiple medical problems -- per primary service    Freeman Caldron, PA-C Pager: [phone] General Trauma PA Pager: [phone]   08/02/2012

## 2012-08-02 NOTE — Plan of Care (Signed)
Problem: Phase II Progression Outcomes Goal: IV changed to normal saline lock Outcome: Completed/Met Date Met:  08/02/12 IV removed for discharge but pt is staying extra day

## 2012-08-05 ENCOUNTER — Encounter: Payer: Medicare Other | Admitting: *Deleted

## 2012-08-09 ENCOUNTER — Encounter: Payer: Self-pay | Admitting: *Deleted

## 2012-10-24 ENCOUNTER — Other Ambulatory Visit (HOSPITAL_COMMUNITY): Payer: Self-pay | Admitting: General Practice

## 2012-10-25 ENCOUNTER — Emergency Department (HOSPITAL_COMMUNITY): Payer: Medicare Other

## 2012-10-25 ENCOUNTER — Inpatient Hospital Stay (HOSPITAL_COMMUNITY): Admission: RE | Admit: 2012-10-25 | Payer: Medicare Other | Source: Ambulatory Visit

## 2012-10-25 ENCOUNTER — Inpatient Hospital Stay (HOSPITAL_COMMUNITY)
Admission: EM | Admit: 2012-10-25 | Discharge: 2012-10-27 | DRG: 605 | Disposition: A | Discharge status: Home Health | Payer: Medicare Other | Attending: Internal Medicine | Admitting: Internal Medicine

## 2012-10-25 ENCOUNTER — Encounter (HOSPITAL_COMMUNITY): Payer: Self-pay | Admitting: Emergency Medicine

## 2012-10-25 DIAGNOSIS — I10 Essential (primary) hypertension: Secondary | ICD-10-CM

## 2012-10-25 DIAGNOSIS — S0101XA Laceration without foreign body of scalp, initial encounter: Secondary | ICD-10-CM

## 2012-10-25 DIAGNOSIS — K219 Gastro-esophageal reflux disease without esophagitis: Secondary | ICD-10-CM

## 2012-10-25 DIAGNOSIS — E785 Hyperlipidemia, unspecified: Secondary | ICD-10-CM | POA: Diagnosis present

## 2012-10-25 DIAGNOSIS — L03116 Cellulitis of left lower limb: Secondary | ICD-10-CM

## 2012-10-25 DIAGNOSIS — A498 Other bacterial infections of unspecified site: Secondary | ICD-10-CM | POA: Diagnosis present

## 2012-10-25 DIAGNOSIS — Y92009 Unspecified place in unspecified non-institutional (private) residence as the place of occurrence of the external cause: Secondary | ICD-10-CM

## 2012-10-25 DIAGNOSIS — W19XXXA Unspecified fall, initial encounter: Secondary | ICD-10-CM

## 2012-10-25 DIAGNOSIS — I509 Heart failure, unspecified: Secondary | ICD-10-CM | POA: Diagnosis present

## 2012-10-25 DIAGNOSIS — I5022 Chronic systolic (congestive) heart failure: Secondary | ICD-10-CM | POA: Diagnosis present

## 2012-10-25 DIAGNOSIS — S0100XA Unspecified open wound of scalp, initial encounter: Principal | ICD-10-CM | POA: Diagnosis present

## 2012-10-25 DIAGNOSIS — I4891 Unspecified atrial fibrillation: Secondary | ICD-10-CM

## 2012-10-25 DIAGNOSIS — E039 Hypothyroidism, unspecified: Secondary | ICD-10-CM | POA: Diagnosis present

## 2012-10-25 DIAGNOSIS — D62 Acute posthemorrhagic anemia: Secondary | ICD-10-CM

## 2012-10-25 DIAGNOSIS — Z9181 History of falling: Secondary | ICD-10-CM

## 2012-10-25 DIAGNOSIS — N390 Urinary tract infection, site not specified: Secondary | ICD-10-CM | POA: Diagnosis present

## 2012-10-25 DIAGNOSIS — R600 Localized edema: Secondary | ICD-10-CM

## 2012-10-25 DIAGNOSIS — J4489 Other specified chronic obstructive pulmonary disease: Secondary | ICD-10-CM | POA: Diagnosis present

## 2012-10-25 DIAGNOSIS — Z950 Presence of cardiac pacemaker: Secondary | ICD-10-CM

## 2012-10-25 DIAGNOSIS — M810 Age-related osteoporosis without current pathological fracture: Secondary | ICD-10-CM | POA: Diagnosis present

## 2012-10-25 DIAGNOSIS — Z801 Family history of malignant neoplasm of trachea, bronchus and lung: Secondary | ICD-10-CM

## 2012-10-25 DIAGNOSIS — E669 Obesity, unspecified: Secondary | ICD-10-CM | POA: Diagnosis present

## 2012-10-25 DIAGNOSIS — Z888 Allergy status to other drugs, medicaments and biological substances status: Secondary | ICD-10-CM

## 2012-10-25 DIAGNOSIS — Z882 Allergy status to sulfonamides status: Secondary | ICD-10-CM

## 2012-10-25 DIAGNOSIS — I495 Sick sinus syndrome: Secondary | ICD-10-CM | POA: Diagnosis present

## 2012-10-25 DIAGNOSIS — I252 Old myocardial infarction: Secondary | ICD-10-CM

## 2012-10-25 DIAGNOSIS — Z8249 Family history of ischemic heart disease and other diseases of the circulatory system: Secondary | ICD-10-CM

## 2012-10-25 DIAGNOSIS — J449 Chronic obstructive pulmonary disease, unspecified: Secondary | ICD-10-CM | POA: Diagnosis present

## 2012-10-25 DIAGNOSIS — Z7902 Long term (current) use of antithrombotics/antiplatelets: Secondary | ICD-10-CM

## 2012-10-25 DIAGNOSIS — Z7982 Long term (current) use of aspirin: Secondary | ICD-10-CM

## 2012-10-25 DIAGNOSIS — D649 Anemia, unspecified: Secondary | ICD-10-CM | POA: Diagnosis present

## 2012-10-25 DIAGNOSIS — I2510 Atherosclerotic heart disease of native coronary artery without angina pectoris: Secondary | ICD-10-CM | POA: Diagnosis present

## 2012-10-25 DIAGNOSIS — Z951 Presence of aortocoronary bypass graft: Secondary | ICD-10-CM

## 2012-10-25 DIAGNOSIS — W010XXA Fall on same level from slipping, tripping and stumbling without subsequent striking against object, initial encounter: Secondary | ICD-10-CM | POA: Diagnosis present

## 2012-10-25 DIAGNOSIS — Z79899 Other long term (current) drug therapy: Secondary | ICD-10-CM

## 2012-10-25 DIAGNOSIS — W19XXXD Unspecified fall, subsequent encounter: Secondary | ICD-10-CM

## 2012-10-25 DIAGNOSIS — Z9861 Coronary angioplasty status: Secondary | ICD-10-CM

## 2012-10-25 DIAGNOSIS — Z6834 Body mass index (BMI) 34.0-34.9, adult: Secondary | ICD-10-CM

## 2012-10-25 DIAGNOSIS — D72829 Elevated white blood cell count, unspecified: Secondary | ICD-10-CM | POA: Diagnosis present

## 2012-10-25 DIAGNOSIS — Z8673 Personal history of transient ischemic attack (TIA), and cerebral infarction without residual deficits: Secondary | ICD-10-CM

## 2012-10-25 LAB — CBC WITH DIFFERENTIAL/PLATELET
Basophils Absolute: 0 10*3/uL (ref 0.0–0.1)
Basophils Absolute: 0 10*3/uL (ref 0.0–0.1)
Basophils Relative: 0 % (ref 0–1)
Eosinophils Relative: 0 % (ref 0–5)
HCT: 35 % — ABNORMAL LOW (ref 36.0–46.0)
HCT: 32.4 % — ABNORMAL LOW (ref 36.0–46.0)
Hemoglobin: 11 g/dL — ABNORMAL LOW (ref 12.0–15.0)
Lymphocytes Relative: 10 % — ABNORMAL LOW (ref 12–46)
Lymphocytes Relative: 8 % — ABNORMAL LOW (ref 12–46)
Lymphs Abs: 1.7 10*3/uL (ref 0.7–4.0)
Lymphs Abs: 1.4 10*3/uL (ref 0.7–4.0)
MCV: 87.7 fL (ref 78.0–100.0)
MCV: 88 fL (ref 78.0–100.0)
Monocytes Relative: 8 % (ref 3–12)
Neutro Abs: 13.7 10*3/uL — ABNORMAL HIGH (ref 1.7–7.7)
Neutro Abs: 14.4 10*3/uL — ABNORMAL HIGH (ref 1.7–7.7)
Platelets: 268 10*3/uL (ref 150–400)
RBC: 3.99 MIL/uL (ref 3.87–5.11)
RBC: 3.68 MIL/uL — ABNORMAL LOW (ref 3.87–5.11)
RDW: 15.4 % (ref 11.5–15.5)
WBC: 16.5 10*3/uL — ABNORMAL HIGH (ref 4.0–10.5)
WBC: 17.2 10*3/uL — ABNORMAL HIGH (ref 4.0–10.5)

## 2012-10-25 LAB — BASIC METABOLIC PANEL
CO2: 26 mEq/L (ref 19–32)
Chloride: 99 mEq/L (ref 96–112)
Glucose, Bld: 117 mg/dL — ABNORMAL HIGH (ref 70–99)
Sodium: 137 mEq/L (ref 135–145)

## 2012-10-25 LAB — TROPONIN I: Troponin I: <0.3 ng/mL (ref <0.30)

## 2012-10-25 MED ORDER — BIOTIN 1000 MCG PO TABS: 1000.0000 ug | ORAL_TABLET | Freq: Every evening | ORAL | Status: DC

## 2012-10-25 MED ORDER — CLOPIDOGREL BISULFATE 75 MG PO TABS
75.0000 mg | ORAL_TABLET | Freq: Every day | ORAL | Status: DC
  Administered 2012-10-26 – 2012-10-27 (×2): 75 mg via ORAL
  Filled 2012-10-25 (×3): qty 1

## 2012-10-25 MED ORDER — FUROSEMIDE 20 MG PO TABS
20.0000 mg | ORAL_TABLET | Freq: Every morning | ORAL | Status: DC
  Administered 2012-10-26 – 2012-10-27 (×2): 20 mg via ORAL
  Filled 2012-10-25 (×2): qty 1

## 2012-10-25 MED ORDER — SODIUM CHLORIDE 0.9 % IV SOLN: INTRAVENOUS | Status: DC

## 2012-10-25 MED ORDER — LEVOTHYROXINE SODIUM 150 MCG PO TABS
150.0000 ug | ORAL_TABLET | ORAL | Status: DC
  Administered 2012-10-27: 150 ug via ORAL
  Filled 2012-10-25 (×2): qty 1

## 2012-10-25 MED ORDER — POTASSIUM CHLORIDE CRYS ER 10 MEQ PO TBCR
10.0000 meq | EXTENDED_RELEASE_TABLET | Freq: Every morning | ORAL | Status: DC
  Administered 2012-10-26 – 2012-10-27 (×2): 10 meq via ORAL
  Filled 2012-10-25 (×2): qty 1

## 2012-10-25 MED ORDER — AMIODARONE HCL 200 MG PO TABS: 200.0000 mg | ORAL_TABLET | ORAL | Status: DC

## 2012-10-25 MED ORDER — ASPIRIN EC 81 MG PO TBEC
81.0000 mg | DELAYED_RELEASE_TABLET | Freq: Every evening | ORAL | Status: DC
  Administered 2012-10-25 – 2012-10-26 (×2): 81 mg via ORAL
  Filled 2012-10-25 (×3): qty 1

## 2012-10-25 MED ORDER — NITROGLYCERIN 0.4 MG SL SUBL: 0.4000 mg | SUBLINGUAL_TABLET | SUBLINGUAL | Status: DC | PRN

## 2012-10-25 MED ORDER — LEVOTHYROXINE SODIUM 150 MCG PO TABS: 150.0000 ug | ORAL_TABLET | ORAL | Status: DC

## 2012-10-25 MED ORDER — ACETAMINOPHEN 325 MG PO TABS
650.0000 mg | ORAL_TABLET | Freq: Four times a day (QID) | ORAL | Status: DC | PRN
  Administered 2012-10-25 – 2012-10-27 (×4): 650 mg via ORAL
  Filled 2012-10-25 (×5): qty 2
  Filled 2012-10-25: qty 1

## 2012-10-25 MED ORDER — ACETAMINOPHEN 650 MG RE SUPP: 650.0000 mg | Freq: Four times a day (QID) | RECTAL | Status: DC | PRN

## 2012-10-25 MED ORDER — ONDANSETRON HCL 4 MG PO TABS: 4.0000 mg | ORAL_TABLET | Freq: Four times a day (QID) | ORAL | Status: DC | PRN

## 2012-10-25 MED ORDER — ONDANSETRON HCL 4 MG/2ML IJ SOLN: 4.0000 mg | Freq: Four times a day (QID) | INTRAMUSCULAR | Status: DC | PRN

## 2012-10-25 MED ORDER — RANOLAZINE ER 500 MG PO TB12
500.0000 mg | ORAL_TABLET | Freq: Two times a day (BID) | ORAL | Status: DC
  Administered 2012-10-25 – 2012-10-27 (×4): 500 mg via ORAL
  Filled 2012-10-25 (×5): qty 1

## 2012-10-25 MED ORDER — VITAMIN C 500 MG PO TABS
500.0000 mg | ORAL_TABLET | Freq: Every morning | ORAL | Status: DC
  Administered 2012-10-26 – 2012-10-27 (×2): 500 mg via ORAL
  Filled 2012-10-25 (×2): qty 1

## 2012-10-25 MED ORDER — SODIUM CHLORIDE 0.9 % IJ SOLN
3.0000 mL | Freq: Two times a day (BID) | INTRAMUSCULAR | Status: DC
  Administered 2012-10-25 – 2012-10-26 (×3): 3 mL via INTRAVENOUS

## 2012-10-25 MED ORDER — VITAMIN D (ERGOCALCIFEROL) 1.25 MG (50000 UNIT) PO CAPS: 50000.0000 [IU] | ORAL_CAPSULE | ORAL | Status: DC

## 2012-10-25 MED ORDER — ADULT MULTIVITAMIN W/MINERALS CH
1.0000 | ORAL_TABLET | Freq: Every morning | ORAL | Status: DC
  Administered 2012-10-26: 1 via ORAL
  Filled 2012-10-25 (×2): qty 1

## 2012-10-25 MED ORDER — LEVOTHYROXINE SODIUM 25 MCG PO TABS
225.0000 ug | ORAL_TABLET | ORAL | Status: DC
  Administered 2012-10-26: 225 ug via ORAL
  Filled 2012-10-25: qty 1

## 2012-10-25 MED ORDER — METOPROLOL SUCCINATE ER 25 MG PO TB24
25.0000 mg | ORAL_TABLET | Freq: Every morning | ORAL | Status: DC
  Administered 2012-10-26: 25 mg via ORAL
  Filled 2012-10-25 (×2): qty 1

## 2012-10-25 MED ORDER — FEBUXOSTAT 40 MG PO TABS
80.0000 mg | ORAL_TABLET | Freq: Every morning | ORAL | Status: DC
  Administered 2012-10-26 – 2012-10-27 (×2): 80 mg via ORAL
  Filled 2012-10-25 (×2): qty 2

## 2012-10-25 MED ORDER — PANTOPRAZOLE SODIUM 40 MG PO TBEC
40.0000 mg | DELAYED_RELEASE_TABLET | Freq: Every morning | ORAL | Status: DC
  Administered 2012-10-26 – 2012-10-27 (×2): 40 mg via ORAL
  Filled 2012-10-25 (×2): qty 1

## 2012-10-25 MED ORDER — ESCITALOPRAM OXALATE 5 MG PO TABS
5.0000 mg | ORAL_TABLET | Freq: Every morning | ORAL | Status: DC
  Administered 2012-10-26 – 2012-10-27 (×2): 5 mg via ORAL
  Filled 2012-10-25 (×2): qty 1

## 2012-10-25 NOTE — ED Notes (Signed)
Charted on this patient by error.

## 2012-10-25 NOTE — ED Notes (Signed)
Pt to ED via EMS for witnessed fall. Per family, pt walking into a closet and loss balance and fall; hit head to door. Laceration noted at posterior head. Pt c/o pain at neck, back, and posterior head. Pt is on plavix.  Per EMS BP-145/97, O2-98% on room air, pulse-87, RR 16. Pt has pace maker in place.

## 2012-10-25 NOTE — ED Provider Notes (Signed)
History     CSN: [id number]  Arrival date & time 10/25/12  1539   First MD Initiated Contact with Patient 10/25/12 1553      Chief Complaint  Patient presents with  . Fall    (Consider location/radiation/quality/duration/timing/severity/associated sxs/prior treatment) HPI... level V caveat for urgent need for intervention.   Accidental mechanical fall at home striking left parietal scalp with laceration.  No loss of consciousness or neurological deficits. Also complains of back pain. She is on aspirinand Plavix.   Palpation makes symptoms worse.  Past Medical History  Diagnosis Date  . CHF (congestive heart failure)   . Hypertension   . CVA (cerebral vascular accident)   . Hypothyroidism   . COPD (chronic obstructive pulmonary disease)   . Sick sinus syndrome     status post pacemaker implantation  . Paroxysmal atrial fibrillation   . Subendocardial myocardial infarction   . Subdural hematoma   . CAD (coronary artery disease)     post PTCA and stenting of the LAD and Status post maze procedure  . History of gastrointestinal bleeding   . GERD (gastroesophageal reflux disease)   . Osteoporosis     Past Surgical History  Procedure Laterality Date  . Coronary artery bypass graft  06/2009  . Cardiac catheterization  08/17/2009    Est. EF is 35% to 40%  . Pacemaker insertion      MDT implanted by Dr Reyes Ivan for SSS    Family History  Problem Relation Age of Onset  . Lung cancer Father   . Heart attack Mother   . Coronary artery disease Mother   . Coronary artery disease Brother     with CABG  . Heart attack Brother   . Heart attack Brother   . Lung cancer Brother   . Coronary artery disease Sister   . Hypertension Mother   . Hypertension Brother   . Hypertension Brother     History  Substance Use Topics  . Smoking status: Never Smoker   . Smokeless tobacco: Never Used  . Alcohol Use: No    OB History   Grav Para Term Preterm Abortions TAB SAB Ect Mult  Living                  Review of Systems  All other systems reviewed and are negative.    Allergies  Codeine; Statins; and Sulfa antibiotics  Home Medications   Current Outpatient Rx  Name  Route  Sig  Dispense  Refill  . amiodarone (PACERONE) 200 MG tablet   Oral   Take 200 mg by mouth every Monday, Wednesday, and Friday.         Marland Kitchen aspirin EC 81 MG tablet   Oral   Take 81 mg by mouth every evening.          . Biotin 1000 MCG tablet   Oral   Take 1,000 mcg by mouth every evening.         . Calcium Carbonate-Vitamin D (CALCIUM 600 + D PO)   Oral   Take 1 tablet by mouth every morning.         . clopidogrel (PLAVIX) 75 MG tablet   Oral   Take 75 mg by mouth every morning.         . escitalopram (LEXAPRO) 10 MG tablet   Oral   Take 5 mg by mouth every morning.          . febuxostat (ULORIC) 40 MG tablet  Oral   Take 80 mg by mouth every morning.         . furosemide (LASIX) 20 MG tablet   Oral   Take 20 mg by mouth every morning.          Marland Kitchen levothyroxine (SYNTHROID, LEVOTHROID) 150 MCG tablet   Oral   Take 150-225 mcg by mouth See admin instructions. Takes Sunday-Friday & (1.5 tab) on Sat         . metoprolol succinate (TOPROL-XL) 25 MG 24 hr tablet   Oral   Take 25 mg by mouth every morning.          . Multiple Vitamin (MULTIVITAMIN WITH MINERALS) TABS   Oral   Take 1 tablet by mouth every morning.          . nitroGLYCERIN (NITROSTAT) 0.4 MG SL tablet   Sublingual   Place 0.4 mg under the tongue every 5 (five) minutes as needed. For chest pain         . pantoprazole (PROTONIX) 40 MG tablet   Oral   Take 40 mg by mouth every morning.          . potassium chloride (K-DUR,KLOR-CON) 10 MEQ tablet   Oral   Take 10 mEq by mouth every morning.          . ranolazine (RANEXA) 500 MG 12 hr tablet   Oral   Take 500 mg by mouth 2 (two) times daily.         . Teriparatide, Recombinant, (FORTEO Pony)    Subcutaneous   Inject 20 mcg into the skin every evening.         . vitamin C (ASCORBIC ACID) 500 MG tablet   Oral   Take 500 mg by mouth every morning.          . Vitamin D, Ergocalciferol, (DRISDOL) 50000 UNITS CAPS   Oral   Take 50,000 Units by mouth every 7 (seven) days. Takes on Tuesday           BP 151/95  Pulse 70  Temp(Src) 97.8 F (36.6 C) (Oral)  Ht 5\' 7"  (1.702 m)  Wt 220 lb (99.791 kg)  BMI 34.45 kg/m2  SpO2 99%  Physical Exam  Nursing note and vitals reviewed. Constitutional: She is oriented to person, place, and time.  Obese, in poor health.  HENT:  Head: Normocephalic.  4 cm laceration left parietal scalp  Eyes: Conjunctivae and EOM are normal. Pupils are equal, round, and reactive to light.  Neck: Normal range of motion. Neck supple.  Cardiovascular: Normal rate, regular rhythm and normal heart sounds.   Pulmonary/Chest: Effort normal and breath sounds normal.  Abdominal: Soft. Bowel sounds are normal.  Musculoskeletal:  Diffuse thoracic and lumbar tenderness  Neurological: She is alert and oriented to person, place, and time.  Skin: Skin is warm and dry.  Psychiatric: She has a normal mood and affect.    ED Course  LACERATION REPAIR Date/Time: 10/25/2012 8:22 PM Performed by: Donnetta Hutching Authorized by: Donnetta Hutching Consent: Verbal consent obtained. Risks and benefits: risks, benefits and alternatives were discussed Consent given by: patient Patient understanding: patient states understanding of the procedure being performed Relevant documents: relevant documents present and verified Patient identity confirmed: verbally with patient Time out: Immediately prior to procedure a "time out" was called to verify the correct patient, procedure, equipment, support staff and site/side marked as required. Body area: head/neck Laceration length: 4 cm Vascular damage: yes Patient sedated: no  Preparation: Patient was prepped and draped in the usual  sterile fashion. Irrigation solution: saline Irrigation method: jet lavage Amount of cleaning: extensive Debridement: moderate Skin closure: staples Number of sutures: 12 Technique: simple Approximation: close Approximation difficulty: complex Dressing: 4x4 sterile gauze, antibiotic ointment and pressure dressing Comments: Complex repair secondary to inability to obtain hemostasis.   Eventually wound bleeding settled down. Pressure dressing applied    (including critical care time)  Labs Reviewed  CBC WITH DIFFERENTIAL - Abnormal; Notable for the following:    WBC 16.5 (*)    Hemoglobin 11.8 (*)    HCT 35.0 (*)    Neutrophils Relative 83 (*)    Neutro Abs 13.7 (*)    Lymphocytes Relative 10 (*)    All other components within normal limits  BASIC METABOLIC PANEL - Abnormal; Notable for the following:    Glucose, Bld 117 (*)    BUN 25 (*)    Creatinine, Ser 1.20 (*)    GFR calc non Af Amer 41 (*)    GFR calc Af Amer 48 (*)    All other components within normal limits  TROPONIN I  CBC WITH DIFFERENTIAL     Dg Thoracic Spine W/swimmers  10/25/2012  *RADIOLOGY REPORT*  Clinical Data: Fall.  Upper back pain.  THORACIC SPINE - 2 VIEW + SWIMMERS  Comparison: CT chest from 06/21/2011  Findings: Prior CABG noted.  Pacer leads are in place.  Cervical spondylosis is observed along with thoracic spondylosis. Compression fractures at T6, T7, and T8 are similar to the prior exam from 2012.  There is increase in T9 compression compared to that prior exam.  T12 and L1 compression fractures appear chronic. The T3 compression fracture is new compared to the prior 2012 examination.  There is mild dextroconvex thoracolumbar scoliosis.  No subluxation observed in the thoracic spine.  IMPRESSION:  1.  Increase in the T9 and T3 compression fractures compared to the prior exam from 2012.  These are not necessarily acute. 2.  Old, stable T6, T7, T8, T12, and L1 compression fractures.   Original Report  Authenticated By: Gaylyn Rong, M.D.    Ct Head Wo Contrast  10/25/2012  *RADIOLOGY REPORT*  Clinical Data:  History of trauma from a fall.  Laceration on the posterior scalp.  Neck pain.  CT HEAD WITHOUT CONTRAST CT CERVICAL SPINE WITHOUT CONTRAST  Technique:  Multidetector CT imaging of the head and cervical spine was performed following the standard protocol without intravenous contrast.  Multiplanar CT image reconstructions of the cervical spine were also generated.  Comparison:  Head CT 07/30/2012.  Cervical spine CT 07/30/2012.  CT HEAD  Findings: There are areas of soft tissue swelling in the left frontal scalp and the right occipital scalp.  The right occipital scalp and particular has a large amount of high attenuation in the soft tissues, compatible with a large hematoma.  No acute displaced skull fracture is identified.  There are no acute intracranial abnormalities.  Specifically, no signs of acute post-traumatic intracranial hemorrhage, no definite mass, mass effect, hydrocephalus or abnormal intra or extra-axial fluid collections. Again noted is a large area of encephalomalacia in the left temporal lobe, related to prior left MCA territory infarction. Visualized paranasal sinuses and mastoids are well pneumatized, although the mastoids are hypoplastic, and there is a small amount of mucosal thickening within the left sphenoid sinuses.  IMPRESSION: 1.  Large right occipital scalp hematoma.  Small left frontal scalp contusion.  No acute displaced skull fracture  or acute intracranial abnormality. 2.  Appearance of the brain is essentially unchanged compared to the prior study, as above, including a large area of encephalomalacia in the left temporal lobe.  CT CERVICAL SPINE  Findings: No acute displaced cervical spine fractures are noted. There is multilevel degenerative disc disease, most severe at C5- C6.  Multilevel facet arthropathy is also noted.  These degenerative changes result in 3 mm of  anterolisthesis of C4 upon C5 and 3 mm of retrolisthesis of C5 upon C6.  Alignment is otherwise anatomic.  Prevertebral soft tissues are normal. Visualized portions of the upper thorax are remarkable for mild bilateral apical pleuroparenchymal thickening, presumably post infectious scarring.  IMPRESSION: 1.  No evidence of significant acute traumatic injury to the cervical spine. 2.  Multilevel degenerative disc disease and cervical spondylosis, as above.   Original Report Authenticated By: Trudie Reed, M.D.    Ct Cervical Spine Wo Contrast  10/25/2012  *RADIOLOGY REPORT*  Clinical Data:  History of trauma from a fall.  Laceration on the posterior scalp.  Neck pain.  CT HEAD WITHOUT CONTRAST CT CERVICAL SPINE WITHOUT CONTRAST  Technique:  Multidetector CT imaging of the head and cervical spine was performed following the standard protocol without intravenous contrast.  Multiplanar CT image reconstructions of the cervical spine were also generated.  Comparison:  Head CT 07/30/2012.  Cervical spine CT 07/30/2012.  CT HEAD  Findings: There are areas of soft tissue swelling in the left frontal scalp and the right occipital scalp.  The right occipital scalp and particular has a large amount of high attenuation in the soft tissues, compatible with a large hematoma.  No acute displaced skull fracture is identified.  There are no acute intracranial abnormalities.  Specifically, no signs of acute post-traumatic intracranial hemorrhage, no definite mass, mass effect, hydrocephalus or abnormal intra or extra-axial fluid collections. Again noted is a large area of encephalomalacia in the left temporal lobe, related to prior left MCA territory infarction. Visualized paranasal sinuses and mastoids are well pneumatized, although the mastoids are hypoplastic, and there is a small amount of mucosal thickening within the left sphenoid sinuses.  IMPRESSION: 1.  Large right occipital scalp hematoma.  Small left frontal scalp  contusion.  No acute displaced skull fracture or acute intracranial abnormality. 2.  Appearance of the brain is essentially unchanged compared to the prior study, as above, including a large area of encephalomalacia in the left temporal lobe.  CT CERVICAL SPINE  Findings: No acute displaced cervical spine fractures are noted. There is multilevel degenerative disc disease, most severe at C5- C6.  Multilevel facet arthropathy is also noted.  These degenerative changes result in 3 mm of anterolisthesis of C4 upon C5 and 3 mm of retrolisthesis of C5 upon C6.  Alignment is otherwise anatomic.  Prevertebral soft tissues are normal. Visualized portions of the upper thorax are remarkable for mild bilateral apical pleuroparenchymal thickening, presumably post infectious scarring.  IMPRESSION: 1.  No evidence of significant acute traumatic injury to the cervical spine. 2.  Multilevel degenerative disc disease and cervical spondylosis, as above.   Original Report Authenticated By: Trudie Reed, M.D.    No results found.   No diagnosis found.   Date: 10/25/2012  Rate: 70  Rhythm: normal sinus rhythm  QRS Axis: normal  Intervals: normal  ST/T Wave abnormalities: normal  Conduction Disutrbances: none  Narrative Interpretation: unremarkable  CRITICAL CARE Performed by: Donnetta Hutching   Total critical care time: 45  Critical care time was exclusive  of separately billable procedures and treating other patients.  Critical care was necessary to treat or prevent imminent or life-threatening deterioration.  Critical care was time spent personally by me on the following activities: development of treatment plan with patient and/or surrogate as well as nursing, discussions with consultants, evaluation of patient's response to treatment, examination of patient, obtaining history from patient or surrogate, ordering and performing treatments and interventions, ordering and review of laboratory studies, ordering and  review of radiographic studies, pulse oximetry and re-evaluation of patient's condition.   MDM   CT scan of head and cervical spine negative.   Plain xrays of T spine show multiple old compression fractures.  Difficult repair of scalp lac secondary to bleeding.  Admit to general med       Donnetta Hutching, MD 10/25/12 2027

## 2012-10-25 NOTE — H&P (Signed)
[facility] History and Physical  [name] ZOX:[id number] DOB: October 31, 1929 DOA: 10/25/2012  Referring physician: Dr. Adriana Simas. PCP: Ezequiel Kayser, MD   Chief Complaint: Fall and scalp laceration.  HPI: [name] is a 79 y.o. female history of CAD status post CABG status post stenting on antiplatelet agents, atrial fibrillation not a candidate for Coumadin secondary to frequent falls, TIA/CVA, sick sinus syndrome status post pacemaker placement, hypothyroidism was brought to the ER patient had a fall at her home. Patient states that she was getting ready to go to doctor's office for Reclast injection when she suddenly fell backwards and hit her head. She did not lose consciousness or have any palpitation dizziness chest pain or shortness of breath. Patient was brought to the ER and she had significant bleed from the laceration site and ER physician had at least taken one hour to obtain hemostasis. Patient had sutures placed. CT head was showing hematoma otherwise chronic changes. Since patient has hematoma and had a lot of bleeding there was a concern that the bleeding may occur and wants patient be observed overnight. Patient had had similar falls previously and was placed in rehabilitation after her last discharge. Patient is also found to be having leukocytosis but patient denies any nausea vomiting chest pain shortness of breath or any productive cough. Does complain of increased frequency of urination.  Review of Systems: As presented in the history of presenting illness, rest negative.  Past Medical History  Diagnosis Date  . CHF (congestive heart failure)   . Hypertension   . CVA (cerebral vascular accident)   . Hypothyroidism   . COPD (chronic obstructive pulmonary disease)   . Sick sinus syndrome     status post pacemaker implantation  . Paroxysmal atrial fibrillation   . Subendocardial myocardial infarction   . Subdural hematoma   . CAD (coronary artery disease)      post PTCA and stenting of the LAD and Status post maze procedure  . History of gastrointestinal bleeding   . GERD (gastroesophageal reflux disease)   . Osteoporosis    Past Surgical History  Procedure Laterality Date  . Coronary artery bypass graft  06/2009  . Cardiac catheterization  08/17/2009    Est. EF is 35% to 40%  . Pacemaker insertion      MDT implanted by Dr Reyes Ivan for SSS   Social History:  reports that she has never smoked. She has never used smokeless tobacco. She reports that she does not drink alcohol or use illicit drugs. Lives at home. Patient's niece lives nearby. where does patient live-- Can do ADLs. Can patient participate in ADLs?  Allergies  Allergen Reactions  . Codeine Itching and Rash  . Statins Itching and Rash  . Sulfa Antibiotics Itching and Rash    Family History  Problem Relation Age of Onset  . Lung cancer Father   . Heart attack Mother   . Coronary artery disease Mother   . Coronary artery disease Brother     with CABG  . Heart attack Brother   . Heart attack Brother   . Lung cancer Brother   . Coronary artery disease Sister   . Hypertension Mother   . Hypertension Brother   . Hypertension Brother       Prior to Admission medications   Medication Sig Start Date End Date Taking? Authorizing Provider  amiodarone (PACERONE) 200 MG tablet Take 200 mg by mouth every Monday, Wednesday, and Friday.   Yes Historical Provider, MD  aspirin EC 81 MG tablet Take 81 mg by mouth every evening.    Yes Historical Provider, MD  Biotin 1000 MCG tablet Take 1,000 mcg by mouth every evening.   Yes Historical Provider, MD  Calcium Carbonate-Vitamin D (CALCIUM 600 + D PO) Take 1 tablet by mouth every morning.   Yes Historical Provider, MD  clopidogrel (PLAVIX) 75 MG tablet Take 75 mg by mouth every morning.   Yes Historical Provider, MD  escitalopram (LEXAPRO) 10 MG tablet Take 5 mg by mouth every morning.    Yes Historical Provider, MD  febuxostat (ULORIC)  40 MG tablet Take 80 mg by mouth every morning.   Yes Historical Provider, MD  furosemide (LASIX) 20 MG tablet Take 20 mg by mouth every morning.    Yes Historical Provider, MD  levothyroxine (SYNTHROID, LEVOTHROID) 150 MCG tablet Take 150-225 mcg by mouth See admin instructions. Takes Sunday-Friday & (1.5 tab) on Sat   Yes Historical Provider, MD  metoprolol succinate (TOPROL-XL) 25 MG 24 hr tablet Take 25 mg by mouth every morning.    Yes Historical Provider, MD  Multiple Vitamin (MULTIVITAMIN WITH MINERALS) TABS Take 1 tablet by mouth every morning.    Yes Historical Provider, MD  nitroGLYCERIN (NITROSTAT) 0.4 MG SL tablet Place 0.4 mg under the tongue every 5 (five) minutes as needed. For chest pain   Yes Historical Provider, MD  pantoprazole (PROTONIX) 40 MG tablet Take 40 mg by mouth every morning.    Yes Historical Provider, MD  potassium chloride (K-DUR,KLOR-CON) 10 MEQ tablet Take 10 mEq by mouth every morning.    Yes Historical Provider, MD  ranolazine (RANEXA) 500 MG 12 hr tablet Take 500 mg by mouth 2 (two) times daily.   Yes Historical Provider, MD  Teriparatide, Recombinant, (FORTEO [place]) Inject 20 mcg into the skin every evening.   Yes Historical Provider, MD  vitamin C (ASCORBIC ACID) 500 MG tablet Take 500 mg by mouth every morning.    Yes Historical Provider, MD  Vitamin D, Ergocalciferol, (DRISDOL) 50000 UNITS CAPS Take 50,000 Units by mouth every 7 (seven) days. Takes on Tuesday   Yes Historical Provider, MD   Physical Exam: Filed Vitals:   10/25/12 1945 10/25/12 2000 10/25/12 2110 10/25/12 2137  BP: 113/42 112/44 128/47   Pulse: 70 70 76   Temp:   98.2 F (36.8 C)   TempSrc:      Resp: 20 21 20    Height:    5\' 7"  (1.702 m)  Weight:    98.884 kg (218 lb)  SpO2: 99% 98% 97%      General:  Well-developed well-nourished.  Eyes: Anicteric no pallor.  ENT: Scalp is sutured and presently has dressing on it. No discharge from the ears eyes nose and  mouth.  Neck: No mass felt.  Cardiovascular: S1-S2 heard.  Respiratory: No rhonchi no crepitations.  Abdomen: Soft nontender bowel sounds present.  Skin: No rash.  Musculoskeletal: No edema.  Psychiatric: Appears normal.  Neurologic: Alert and oriented to time place and person. Moves all extremities 5 x 5.  Labs on Admission:  Basic Metabolic Panel:  Recent Labs Lab 10/25/12 1742  NA 137  K 3.9  CL 99  CO2 26  GLUCOSE 117*  BUN 25*  CREATININE 1.20*  CALCIUM 9.3   Liver Function Tests: No results found for this basename: AST, ALT, ALKPHOS, BILITOT, PROT, ALBUMIN,  in the last 168 hours No results found for this basename: LIPASE, AMYLASE,  in the  last 168 hours No results found for this basename: AMMONIA,  in the last 168 hours CBC:  Recent Labs Lab 10/25/12 1742 10/25/12 2021  WBC 16.5* 17.2*  NEUTROABS 13.7* 14.4*  HGB 11.8* 11.0*  HCT 35.0* 32.4*  MCV 87.7 88.0  PLT 268 276   Cardiac Enzymes:  Recent Labs Lab 10/25/12 1742  TROPONINI <0.30    BNP (last 3 results) No results found for this basename: PROBNP,  in the last 8760 hours CBG: No results found for this basename: GLUCAP,  in the last 168 hours  Radiological Exams on Admission: Dg Thoracic Spine W/swimmers  10/25/2012  *RADIOLOGY REPORT*  Clinical Data: Fall.  Upper back pain.  THORACIC SPINE - 2 VIEW + SWIMMERS  Comparison: CT chest from 06/21/2011  Findings: Prior CABG noted.  Pacer leads are in place.  Cervical spondylosis is observed along with thoracic spondylosis. Compression fractures at T6, T7, and T8 are similar to the prior exam from 2012.  There is increase in T9 compression compared to that prior exam.  T12 and L1 compression fractures appear chronic. The T3 compression fracture is new compared to the prior 2012 examination.  There is mild dextroconvex thoracolumbar scoliosis.  No subluxation observed in the thoracic spine.  IMPRESSION:  1.  Increase in the T9 and T3 compression  fractures compared to the prior exam from 2012.  These are not necessarily acute. 2.  Old, stable T6, T7, T8, T12, and L1 compression fractures.   Original Report Authenticated By: Gaylyn Rong, M.D.    Ct Head Wo Contrast  10/25/2012  *RADIOLOGY REPORT*  Clinical Data:  History of trauma from a fall.  Laceration on the posterior scalp.  Neck pain.  CT HEAD WITHOUT CONTRAST CT CERVICAL SPINE WITHOUT CONTRAST  Technique:  Multidetector CT imaging of the head and cervical spine was performed following the standard protocol without intravenous contrast.  Multiplanar CT image reconstructions of the cervical spine were also generated.  Comparison:  Head CT 07/30/2012.  Cervical spine CT 07/30/2012.  CT HEAD  Findings: There are areas of soft tissue swelling in the left frontal scalp and the right occipital scalp.  The right occipital scalp and particular has a large amount of high attenuation in the soft tissues, compatible with a large hematoma.  No acute displaced skull fracture is identified.  There are no acute intracranial abnormalities.  Specifically, no signs of acute post-traumatic intracranial hemorrhage, no definite mass, mass effect, hydrocephalus or abnormal intra or extra-axial fluid collections. Again noted is a large area of encephalomalacia in the left temporal lobe, related to prior left MCA territory infarction. Visualized paranasal sinuses and mastoids are well pneumatized, although the mastoids are hypoplastic, and there is a small amount of mucosal thickening within the left sphenoid sinuses.  IMPRESSION: 1.  Large right occipital scalp hematoma.  Small left frontal scalp contusion.  No acute displaced skull fracture or acute intracranial abnormality. 2.  Appearance of the brain is essentially unchanged compared to the prior study, as above, including a large area of encephalomalacia in the left temporal lobe.  CT CERVICAL SPINE  Findings: No acute displaced cervical spine fractures are  noted. There is multilevel degenerative disc disease, most severe at C5- C6.  Multilevel facet arthropathy is also noted.  These degenerative changes result in 3 mm of anterolisthesis of C4 upon C5 and 3 mm of retrolisthesis of C5 upon C6.  Alignment is otherwise anatomic.  Prevertebral soft tissues are normal. Visualized portions of the upper  thorax are remarkable for mild bilateral apical pleuroparenchymal thickening, presumably post infectious scarring.  IMPRESSION: 1.  No evidence of significant acute traumatic injury to the cervical spine. 2.  Multilevel degenerative disc disease and cervical spondylosis, as above.   Original Report Authenticated By: Trudie Reed, M.D.    Ct Cervical Spine Wo Contrast  10/25/2012  *RADIOLOGY REPORT*  Clinical Data:  History of trauma from a fall.  Laceration on the posterior scalp.  Neck pain.  CT HEAD WITHOUT CONTRAST CT CERVICAL SPINE WITHOUT CONTRAST  Technique:  Multidetector CT imaging of the head and cervical spine was performed following the standard protocol without intravenous contrast.  Multiplanar CT image reconstructions of the cervical spine were also generated.  Comparison:  Head CT 07/30/2012.  Cervical spine CT 07/30/2012.  CT HEAD  Findings: There are areas of soft tissue swelling in the left frontal scalp and the right occipital scalp.  The right occipital scalp and particular has a large amount of high attenuation in the soft tissues, compatible with a large hematoma.  No acute displaced skull fracture is identified.  There are no acute intracranial abnormalities.  Specifically, no signs of acute post-traumatic intracranial hemorrhage, no definite mass, mass effect, hydrocephalus or abnormal intra or extra-axial fluid collections. Again noted is a large area of encephalomalacia in the left temporal lobe, related to prior left MCA territory infarction. Visualized paranasal sinuses and mastoids are well pneumatized, although the mastoids are hypoplastic,  and there is a small amount of mucosal thickening within the left sphenoid sinuses.  IMPRESSION: 1.  Large right occipital scalp hematoma.  Small left frontal scalp contusion.  No acute displaced skull fracture or acute intracranial abnormality. 2.  Appearance of the brain is essentially unchanged compared to the prior study, as above, including a large area of encephalomalacia in the left temporal lobe.  CT CERVICAL SPINE  Findings: No acute displaced cervical spine fractures are noted. There is multilevel degenerative disc disease, most severe at C5- C6.  Multilevel facet arthropathy is also noted.  These degenerative changes result in 3 mm of anterolisthesis of C4 upon C5 and 3 mm of retrolisthesis of C5 upon C6.  Alignment is otherwise anatomic.  Prevertebral soft tissues are normal. Visualized portions of the upper thorax are remarkable for mild bilateral apical pleuroparenchymal thickening, presumably post infectious scarring.  IMPRESSION: 1.  No evidence of significant acute traumatic injury to the cervical spine. 2.  Multilevel degenerative disc disease and cervical spondylosis, as above.   Original Report Authenticated By: Trudie Reed, M.D.     EKG: Independently reviewed. Shows a regular rhythm. Doesn't show any features of A. fib. T-wave flattening.  Assessment/Plan Principal Problem:   Scalp laceration Active Problems:   Atrial fibrillation   Chronic systolic congestive heart failure, NYHA class 2 with an ejection fraction of 30%   Fall   Leucocytosis   1. Scalp laceration with scalp hematoma and bleeding status post suturing - at this time we'll observe patient overnight for any further occurrences of bleed. Patient is on aspirin and Plavix. The next dose is due tomorrow morning. If there is no further bleed can continue antiplatelet agents. Patient's fall looks like mechanical. At this time I have requested orthostatic vital signs in the morning. If patient orthostatic then may  discontinue Lasix but patient does not give any history of dizziness or orthostatic changes. Physical therapy consult requested. 2. Leukocytosis - patient complains of increased frequency of urination. Check UA. We'll also check chest x-ray. If  any of them is positive then will start on antibiotics. 3. CAD status post CABG - denies any chest pain. 4. History of paroxysmal atrial ablation and sick sinus syndrome status post pacemaker placement - presently is rate is controlled is controlled and patient is not a candidate for Coumadin secondary to frequent falls. 5. Hypothyroidism - continue Synthroid. Check TSH. 6. History of CHF last EF measured in January of this year was 60-65% - see #1 with regarding to Lasix. Presently looks compensated. 7. Hyperlipidemia - continue present medications. 8. Chronic anemia - follow CBC.    Code Status: Full code.  Family Communication: Patient's niece at the bedside. Patient's niece lives next to patient's house.  Disposition Plan: Admit for observation.    KAKRAKANDY,ARSHAD N. [facility] Pager [phone].  If 7PM-7AM, please contact night-coverage [email or web address] Password [facility] 10/25/2012, 10:33 PM

## 2012-10-26 ENCOUNTER — Observation Stay (HOSPITAL_COMMUNITY): Payer: Medicare Other

## 2012-10-26 DIAGNOSIS — N390 Urinary tract infection, site not specified: Secondary | ICD-10-CM | POA: Diagnosis present

## 2012-10-26 DIAGNOSIS — I5022 Chronic systolic (congestive) heart failure: Secondary | ICD-10-CM

## 2012-10-26 DIAGNOSIS — I509 Heart failure, unspecified: Secondary | ICD-10-CM

## 2012-10-26 DIAGNOSIS — E039 Hypothyroidism, unspecified: Secondary | ICD-10-CM

## 2012-10-26 DIAGNOSIS — D62 Acute posthemorrhagic anemia: Secondary | ICD-10-CM

## 2012-10-26 DIAGNOSIS — K219 Gastro-esophageal reflux disease without esophagitis: Secondary | ICD-10-CM

## 2012-10-26 LAB — CBC
HCT: 33.5 % — ABNORMAL LOW (ref 36.0–46.0)
MCH: 29.6 pg (ref 26.0–34.0)
MCV: 87 fL (ref 78.0–100.0)
Platelets: 249 10*3/uL (ref 150–400)
RBC: 3.85 MIL/uL — ABNORMAL LOW (ref 3.87–5.11)
WBC: 10.5 10*3/uL (ref 4.0–10.5)

## 2012-10-26 LAB — URINALYSIS, ROUTINE W REFLEX MICROSCOPIC
Glucose, UA: NEGATIVE mg/dL
Hgb urine dipstick: NEGATIVE
Ketones, ur: NEGATIVE mg/dL
Protein, ur: NEGATIVE mg/dL
pH: 6.5 (ref 5.0–8.0)

## 2012-10-26 LAB — BASIC METABOLIC PANEL
CO2: 27 mEq/L (ref 19–32)
Calcium: 9 mg/dL (ref 8.4–10.5)
Chloride: 101 mEq/L (ref 96–112)
Creatinine, Ser: 1.15 mg/dL — ABNORMAL HIGH (ref 0.50–1.10)
Glucose, Bld: 105 mg/dL — ABNORMAL HIGH (ref 70–99)
Sodium: 139 mEq/L (ref 135–145)

## 2012-10-26 LAB — URINE MICROSCOPIC-ADD ON

## 2012-10-26 MED ORDER — DEXTROSE 5 % IV SOLN
1.0000 g | Freq: Every day | INTRAVENOUS | Status: DC
  Administered 2012-10-26 (×2): 1 g via INTRAVENOUS
  Filled 2012-10-26 (×3): qty 10

## 2012-10-26 NOTE — Progress Notes (Signed)
Utilization review completed.  P.J. Kavitha Lansdale,RN,BSN Case Manager [phone]  

## 2012-10-26 NOTE — Progress Notes (Signed)
Physical Therapy Evaluation Patient Details Name: [name] MRN: [id number] DOB: [birth year] Today's Date: 10/26/2012 Time: 1478-2956 PT Time Calculation (min): 29 min  PT Assessment / Plan / Recommendation Clinical Impression  Pt is 79 yo female s/p posterior fall with scalp laceration. Pt is somewhat confused today which assume is close to her baseline. Pt has 24 hr supervision at home and this needs to continue. The family just purchased a chair alarm which is a good safety measure as pt cannot remember to get help before getting up. Recommend HHPT and PT will follow acutely to increase mobility and work on balance.     PT Assessment  Patient needs continued PT services    Follow Up Recommendations  Home health PT;Supervision/Assistance - 24 hour    Does the patient have the potential to tolerate intense rehabilitation      Barriers to Discharge None      Equipment Recommendations  None recommended by PT    Recommendations for Other Services     Frequency Min 3X/week    Precautions / Restrictions Precautions Precautions: Fall Precaution Comments: pt tends to fall bkwds, has fallen multiple times Restrictions Weight Bearing Restrictions: No Other Position/Activity Restrictions: decreased vision out of right eye   Pertinent Vitals/Pain Faces 6/10 back pain, RN notified      Mobility  Bed Mobility Bed Mobility: Rolling Right;Right Sidelying to Sit;Sitting - Scoot to [place] of Bed Rolling Right: 4: Min assist;With rail Right Sidelying to Sit: 4: Min assist;With rails Sitting - Scoot to [place] of Bed: 4: Min assist Details for Bed Mobility Assistance: pt first tried to sit straight up and was unable to do so, when cued to roll over to right, she got to EOB with min A. Somewhat limited by back pain. Transfers Transfers: Sit to Stand;Stand to Sit Sit to Stand: 4: Min assist;With upper extremity assist;From toilet;From bed Stand to Sit: To toilet;To chair/3-in-1;4: Min  assist;With upper extremity assist Details for Transfer Assistance: vc's for getting aligned properly before sitting and to control sitting, pt tends to flop into chair. Also somewhat impulsive, definite safety risk. Ambulation/Gait Ambulation/Gait Assistance: 4: Min assist Ambulation Distance (Feet): 75 Feet Assistive device: Rolling walker Ambulation/Gait Assistance Details: vc's to keep feet within RW, pt tends to stay very close to objects on her left (likely because she cannot see as well out of the right). Ambulates with bilateral feet internally rotated. Seems to have difficulty making her feet keep up with her upper body, leading to a fwd lean. Gait Pattern: Step-through pattern;Decreased stride length;Trunk flexed Gait velocity: decreased Stairs: No Wheelchair Mobility Wheelchair Mobility: No    Exercises     PT Diagnosis: Difficulty walking;Generalized weakness;Acute pain  PT Problem List: Decreased strength;Decreased activity tolerance;Decreased balance;Decreased mobility;Decreased cognition;Decreased knowledge of use of DME;Decreased safety awareness;Decreased knowledge of precautions;Pain PT Treatment Interventions: DME instruction;Gait training;Functional mobility training;Therapeutic activities;Therapeutic exercise;Balance training;Patient/family education   PT Goals Acute Rehab PT Goals PT Goal Formulation: With patient Time For Goal Achievement: 11/09/12 Potential to Achieve Goals: Good Pt will go Supine/Side to Sit: with supervision PT Goal: Supine/Side to Sit - Progress: Goal set today Pt will go Sit to Supine/Side: with supervision PT Goal: Sit to Supine/Side - Progress: Goal set today Pt will go Sit to Stand: with supervision PT Goal: Sit to Stand - Progress: Goal set today Pt will go Stand to Sit: with supervision PT Goal: Stand to Sit - Progress: Goal set today Pt will Ambulate: with supervision;51 - 150 feet;with  rolling walker PT Goal: Ambulate - Progress:  Goal set today  Visit Information  Last PT Received On: 10/26/12 Assistance Needed: +1 PT/OT Co-Evaluation/Treatment: Yes    Subjective Data  Subjective: when asked what the call bell was, pt replied, "that's a bird bath" Patient Stated Goal: return home with family   Prior Functioning  Home Living Lives With: Spouse Available Help at Discharge: Family;Available 24 hours/day Type of Home: House Home Access: Level entry Home Layout: One level Bathroom Shower/Tub: Tub/shower unit (family helps her) Firefighter: Standard Bathroom Accessibility: Yes How Accessible: Accessible via walker Home Adaptive Equipment: Shower chair with back;Walker - rolling;Bedside commode/3-in-1 Additional Comments: good family support, they want to take her home and plan to provide 24 hr assist and even purchased a chair alarm this time Prior Function Level of Independence: Needs assistance Needs Assistance: Bathing;Dressing Able to Take Stairs?: No Driving: No Vocation: Retired Musician: No difficulties    Copywriter, advertising Arousal/Alertness: Awake/alert Behavior During Therapy: WFL for tasks assessed/performed Overall Cognitive Status: History of cognitive impairments - at baseline    Extremity/Trunk Assessment Right Upper Extremity Assessment RUE ROM/Strength/Tone: Deficits RUE ROM/Strength/Tone Deficits: generalized weakness noted x4 extremities RUE Sensation: WFL - Light Touch RUE Coordination: WFL - gross motor Left Upper Extremity Assessment LUE ROM/Strength/Tone: Deficits LUE ROM/Strength/Tone Deficits: same as RUE LUE Sensation: WFL - Light Touch LUE Coordination: WFL - gross motor Right Lower Extremity Assessment RLE ROM/Strength/Tone: Deficits RLE ROM/Strength/Tone Deficits: generalized weakness noted, grossly 3/5 throughout RLE Sensation: WFL - Light Touch RLE Coordination: WFL - gross motor Left Lower Extremity Assessment LLE ROM/Strength/Tone:  Deficits LLE ROM/Strength/Tone Deficits: same as RLE LLE Sensation: WFL - Light Touch LLE Coordination: WFL - gross motor Trunk Assessment Trunk Assessment: Kyphotic   Balance Balance Balance Assessed: Yes Static Standing Balance Static Standing - Balance Support: Bilateral upper extremity supported;During functional activity Static Standing - Level of Assistance: 4: Min assist Static Standing - Comment/# of Minutes: upon initial standing after toileting, pt with posterior LOB with min A to correct. She reports that this is frequently how she loses her balance posteriorly, that is, when she first stands up.  End of Session PT - End of Session Equipment Utilized During Treatment: Gait belt Activity Tolerance: Patient tolerated treatment well Patient left: in chair;with call bell/phone within reach Nurse Communication: Mobility status  GP   Lyanne Co, PT  Acute Rehab Services  [phone]   Lyanne Co 10/26/2012, 3:44 PM

## 2012-10-26 NOTE — Evaluation (Signed)
Occupational Therapy Evaluation and Discharge Patient Details Name: [name] MRN: [id number] DOB: Jun 13, 1930 Today's Date: 10/26/2012 Time: 4010-2725 OT Time Calculation (min): 28 min  OT Assessment / Plan / Recommendation Clinical Impression  This 79 yo female s/p fall with resultant posterior head hematom presents to acute OT with all education completed at this time. Recommend HHOT. Acute OT will D/C patient (she has all DME per RN per family).    OT Assessment  Patient needs continued OT Services    Follow Up Recommendations  Home health OT    Barriers to Discharge None             Precautions / Restrictions Precautions Precautions: Fall Precaution Comments: pt tends to fall bkwds, has fallen multiple times Restrictions Weight Bearing Restrictions: No Other Position/Activity Restrictions: decreased vision out of right eye   Pertinent Vitals/Pain Back pain, RN made aware    ADL  Toilet Transfer: Minimal assistance Toilet Transfer Method: Sit to stand Toilet Transfer Equipment: Comfort height toilet;Grab bars Toileting - Clothing Manipulation and Hygiene: Performed (Min A for front peri, Max A for clothing) Where Assessed - Toileting Clothing Manipulation and Hygiene: Standing Equipment Used: Gait belt;Rolling walker Transfers/Ambulation Related to ADLs: Min A for all ADL Comments: At home per pt she needs varied A for BADLs given the day and how she is feeling and she has A prn. Per nursing the family relayed that this is correct. The family also bought a chair alarm today since the reason the patient fell is that she got up without telling anyone.    OT Diagnosis: Generalized weakness;Cognitive deficits  OT Problem List: Decreased cognition;Impaired balance (sitting and/or standing)       Visit Information  Last OT Received On: 10/26/12 Assistance Needed: +1 PT/OT Co-Evaluation/Treatment: Yes    Subjective Data  Subjective: I live with my husband     Prior Functioning     Home Living Lives With: Spouse Available Help at Discharge: Family;Available 24 hours/day Type of Home: House Home Access: Level entry Home Layout: One level Bathroom Shower/Tub: Tub/shower unit (family helps her) Firefighter: Standard Bathroom Accessibility: Yes How Accessible: Accessible via walker Home Adaptive Equipment: Shower chair with back;Walker - rolling;Bedside commode/3-in-1 Additional Comments: good family support, they want to take her home and plan to provide 24 hr assist and even purchased a chair alarm this time Prior Function Level of Independence: Needs assistance Needs Assistance: Bathing;Dressing Able to Take Stairs?: No Driving: No Vocation: Retired Musician: No difficulties         Vision/Perception Vision - History Baseline Vision: Wears glasses all the time Patient Visual Report: No change from baseline   Cognition  Cognition Arousal/Alertness: Awake/alert Behavior During Therapy: WFL for tasks assessed/performed Overall Cognitive Status: History of cognitive impairments - at baseline    Extremity/Trunk Assessment Right Upper Extremity Assessment RUE ROM/Strength/Tone: Deficits RUE ROM/Strength/Tone Deficits: generalized weakness noted x4 extremities RUE Sensation: WFL - Light Touch RUE Coordination: WFL - gross motor Left Upper Extremity Assessment LUE ROM/Strength/Tone: Deficits LUE ROM/Strength/Tone Deficits: same as RUE LUE Sensation: WFL - Light Touch LUE Coordination: WFL - gross motor Right Lower Extremity Assessment RLE ROM/Strength/Tone: Deficits RLE ROM/Strength/Tone Deficits: generalized weakness noted, grossly 3/5 throughout RLE Sensation: WFL - Light Touch RLE Coordination: WFL - gross motor Left Lower Extremity Assessment LLE ROM/Strength/Tone: Deficits LLE ROM/Strength/Tone Deficits: same as RLE LLE Sensation: WFL - Light Touch LLE Coordination: WFL - gross motor Trunk  Assessment Trunk Assessment: Kyphotic  Mobility Bed Mobility Bed Mobility: Rolling Right;Right Sidelying to Sit;Sitting - Scoot to [place] of Bed Rolling Right: 4: Min assist;With rail Right Sidelying to Sit: 4: Min assist;With rails Sitting - Scoot to [place] of Bed: 4: Min assist Details for Bed Mobility Assistance: pt first tried to sit straight up and was unable to do so, when cued to roll over to right, she got to EOB with min A. Somewhat limited by back pain. Transfers Sit to Stand: 4: Min assist;With upper extremity assist;From toilet;From bed Stand to Sit: To toilet;To chair/3-in-1;4: Min assist;With upper extremity assist Details for Transfer Assistance: vc's for getting aligned properly before sitting and to control sitting, pt tends to flop into chair. Also somewhat impulsive, definite safety risk.        Balance Balance Balance Assessed: Yes Static Standing Balance Static Standing - Balance Support: Bilateral upper extremity supported;During functional activity Static Standing - Level of Assistance: 4: Min assist Static Standing - Comment/# of Minutes: upon initial standing after toileting, pt with posterior LOB with min A to correct. She reports that this is frequently how she loses her balance posteriorly, that is, when she first stands up.   End of Session OT - End of Session Equipment Utilized During Treatment: Gait belt Activity Tolerance: Patient tolerated treatment well Patient left: in chair;with call bell/phone within reach Nurse Communication:  (Nurse in room, saw pt +1 ambulation, use 3n1)       Evette Georges [phone] 10/26/2012, 3:51 PM

## 2012-10-26 NOTE — Progress Notes (Signed)
Patient ID: [name]  female  RUE:[id number]    DOB: Aug 27, 1929    DOA: 10/25/2012  PCP: Ezequiel Kayser, MD  Assessment/Plan: Principal Problem:   Scalp laceration - PT consult requested - Orthostatics negative , continue aspirin and Plavix  Active Problems:   Atrial fibrillation: - Rate controlled, continue aspirin Plavix - Not a Coumadin candidate secondary to 2 falls    Chronic systolic congestive heart failure, NYHA class 2 with an ejection fraction of 30% - Currently compensated    Fall: PTOT evaluation pending    Leucocytosis: Improved, Secondary to UTI - Obtain urine culture, continue Rocephin IV  Hypothyroidism: Continue Synthroid  DVT Prophylaxis:  Code Status:  Disposition: PT evaluation pending, hopefully tomorrow if stable    Subjective: Feeling okay, moving all 4 extremities, no specific complaints, dressing intact on the scope  Objective: Weight change:  No intake or output data in the 24 hours ending 10/26/12 1132 Blood pressure 129/51, pulse 70, temperature 97.8 F (36.6 C), temperature source Oral, resp. rate 18, height 5\' 7"  (1.702 m), weight 98.884 kg (218 lb), SpO2 98.00%.  Physical Exam: General: Alert and awake, oriented x3, not in any acute distress. HEENT: anicteric sclera, perla, EOMI, dressing intact on the scalp  CVS: S1-S2 clear, no murmur rubs or gallops Chest: clear to auscultation bilaterally, no wheezing, rales or rhonchi Abdomen: soft nontender, nondistended, normal bowel sounds Extremities: no cyanosis, clubbing or edema noted bilaterally Neuro: Cranial nerves II-XII intact, no focal neurological deficits  Lab Results: Basic Metabolic Panel:  Recent Labs Lab 10/25/12 1742 10/26/12 0535  NA 137 139  K 3.9 3.7  CL 99 101  CO2 26 27  GLUCOSE 117* 105*  BUN 25* 25*  CREATININE 1.20* 1.15*  CALCIUM 9.3 9.0   CBC:  Recent Labs Lab 10/25/12 2021 10/26/12 0535  WBC 17.2* 10.5  NEUTROABS 14.4*  --   HGB 11.0* 11.4*   HCT 32.4* 33.5*  MCV 88.0 87.0  PLT 276 249   Cardiac Enzymes:  Recent Labs Lab 10/25/12 1742  TROPONINI <0.30   BNP: No components found with this basename: POCBNP,  CBG: No results found for this basename: GLUCAP,  in the last 168 hours   Micro Results: Recent Results (from the past 240 hour(s))  MRSA PCR SCREENING     Status: None   Collection Time    10/25/12 11:02 PM      Result Value Range Status   MRSA by PCR NEGATIVE  NEGATIVE Final   Comment:            The GeneXpert MRSA Assay (FDA     approved for NASAL specimens     only), is one component of a     comprehensive MRSA colonization     surveillance program. It is not     intended to diagnose MRSA     infection nor to guide or     monitor treatment for     MRSA infections.    Studies/Results: Dg Thoracic Spine W/swimmers  10/25/2012  *RADIOLOGY REPORT*  Clinical Data: Fall.  Upper back pain.  THORACIC SPINE - 2 VIEW + SWIMMERS  Comparison: CT chest from 06/21/2011  Findings: Prior CABG noted.  Pacer leads are in place.  Cervical spondylosis is observed along with thoracic spondylosis. Compression fractures at T6, T7, and T8 are similar to the prior exam from 2012.  There is increase in T9 compression compared to that prior exam.  T12 and L1 compression fractures appear  chronic. The T3 compression fracture is new compared to the prior 2012 examination.  There is mild dextroconvex thoracolumbar scoliosis.  No subluxation observed in the thoracic spine.  IMPRESSION:  1.  Increase in the T9 and T3 compression fractures compared to the prior exam from 2012.  These are not necessarily acute. 2.  Old, stable T6, T7, T8, T12, and L1 compression fractures.   Original Report Authenticated By: Gaylyn Rong, M.D.    Ct Head Wo Contrast  10/25/2012  *RADIOLOGY REPORT*  Clinical Data:  History of trauma from a fall.  Laceration on the posterior scalp.  Neck pain.  CT HEAD WITHOUT CONTRAST CT CERVICAL SPINE WITHOUT CONTRAST   Technique:  Multidetector CT imaging of the head and cervical spine was performed following the standard protocol without intravenous contrast.  Multiplanar CT image reconstructions of the cervical spine were also generated.  Comparison:  Head CT 07/30/2012.  Cervical spine CT 07/30/2012.  CT HEAD  Findings: There are areas of soft tissue swelling in the left frontal scalp and the right occipital scalp.  The right occipital scalp and particular has a large amount of high attenuation in the soft tissues, compatible with a large hematoma.  No acute displaced skull fracture is identified.  There are no acute intracranial abnormalities.  Specifically, no signs of acute post-traumatic intracranial hemorrhage, no definite mass, mass effect, hydrocephalus or abnormal intra or extra-axial fluid collections. Again noted is a large area of encephalomalacia in the left temporal lobe, related to prior left MCA territory infarction. Visualized paranasal sinuses and mastoids are well pneumatized, although the mastoids are hypoplastic, and there is a small amount of mucosal thickening within the left sphenoid sinuses.  IMPRESSION: 1.  Large right occipital scalp hematoma.  Small left frontal scalp contusion.  No acute displaced skull fracture or acute intracranial abnormality. 2.  Appearance of the brain is essentially unchanged compared to the prior study, as above, including a large area of encephalomalacia in the left temporal lobe.  CT CERVICAL SPINE  Findings: No acute displaced cervical spine fractures are noted. There is multilevel degenerative disc disease, most severe at C5- C6.  Multilevel facet arthropathy is also noted.  These degenerative changes result in 3 mm of anterolisthesis of C4 upon C5 and 3 mm of retrolisthesis of C5 upon C6.  Alignment is otherwise anatomic.  Prevertebral soft tissues are normal. Visualized portions of the upper thorax are remarkable for mild bilateral apical pleuroparenchymal thickening,  presumably post infectious scarring.  IMPRESSION: 1.  No evidence of significant acute traumatic injury to the cervical spine. 2.  Multilevel degenerative disc disease and cervical spondylosis, as above.   Original Report Authenticated By: Trudie Reed, M.D.    Ct Cervical Spine Wo Contrast  10/25/2012  *RADIOLOGY REPORT*  Clinical Data:  History of trauma from a fall.  Laceration on the posterior scalp.  Neck pain.  CT HEAD WITHOUT CONTRAST CT CERVICAL SPINE WITHOUT CONTRAST  Technique:  Multidetector CT imaging of the head and cervical spine was performed following the standard protocol without intravenous contrast.  Multiplanar CT image reconstructions of the cervical spine were also generated.  Comparison:  Head CT 07/30/2012.  Cervical spine CT 07/30/2012.  CT HEAD  Findings: There are areas of soft tissue swelling in the left frontal scalp and the right occipital scalp.  The right occipital scalp and particular has a large amount of high attenuation in the soft tissues, compatible with a large hematoma.  No acute displaced skull fracture is  identified.  There are no acute intracranial abnormalities.  Specifically, no signs of acute post-traumatic intracranial hemorrhage, no definite mass, mass effect, hydrocephalus or abnormal intra or extra-axial fluid collections. Again noted is a large area of encephalomalacia in the left temporal lobe, related to prior left MCA territory infarction. Visualized paranasal sinuses and mastoids are well pneumatized, although the mastoids are hypoplastic, and there is a small amount of mucosal thickening within the left sphenoid sinuses.  IMPRESSION: 1.  Large right occipital scalp hematoma.  Small left frontal scalp contusion.  No acute displaced skull fracture or acute intracranial abnormality. 2.  Appearance of the brain is essentially unchanged compared to the prior study, as above, including a large area of encephalomalacia in the left temporal lobe.  CT CERVICAL  SPINE  Findings: No acute displaced cervical spine fractures are noted. There is multilevel degenerative disc disease, most severe at C5- C6.  Multilevel facet arthropathy is also noted.  These degenerative changes result in 3 mm of anterolisthesis of C4 upon C5 and 3 mm of retrolisthesis of C5 upon C6.  Alignment is otherwise anatomic.  Prevertebral soft tissues are normal. Visualized portions of the upper thorax are remarkable for mild bilateral apical pleuroparenchymal thickening, presumably post infectious scarring.  IMPRESSION: 1.  No evidence of significant acute traumatic injury to the cervical spine. 2.  Multilevel degenerative disc disease and cervical spondylosis, as above.   Original Report Authenticated By: Trudie Reed, M.D.    Dg Chest Port 1 View  10/26/2012  *RADIOLOGY REPORT*  Clinical Data: Pulmonary infiltrates.  PORTABLE CHEST - 1 VIEW  Comparison: 08/02/2012  Findings: Dual lead pacer remains in place.  Coronary stents noted along with evidence of prior median sternotomy.  The patient is rotated to the left on today's exam, resulting in reduced diagnostic sensitivity and specificity.   Low lung volumes noted.  Mild cardiomegaly is present.  The lungs appear clear. Prior left lower lobe airspace opacity from January appears to have resolved.  Healed right proximal humeral fracture noted.  IMPRESSION:  1.  Mild cardiomegaly, without edema.   Original Report Authenticated By: Gaylyn Rong, M.D.     Medications: Scheduled Meds: . [START ON 10/28/2012] amiodarone  200 mg Oral Q M,W,F  . aspirin EC  81 mg Oral QPM  . cefTRIAXone (ROCEPHIN)  IV  1 g Intravenous QHS  . clopidogrel  75 mg Oral Q breakfast  . escitalopram  5 mg Oral q morning - 10a  . febuxostat  80 mg Oral q morning - 10a  . furosemide  20 mg Oral q morning - 10a  . [START ON 10/27/2012] levothyroxine  150 mcg Oral Custom  . levothyroxine  225 mcg Oral Custom  . metoprolol succinate  25 mg Oral q morning - 10a  .  multivitamin with minerals  1 tablet Oral q morning - 10a  . pantoprazole  40 mg Oral q morning - 10a  . potassium chloride  10 mEq Oral q morning - 10a  . ranolazine  500 mg Oral BID  . sodium chloride  3 mL Intravenous Q12H  . vitamin C  500 mg Oral q morning - 10a  . [START ON 10/29/2012] Vitamin D (Ergocalciferol)  50,000 Units Oral Q Tue      LOS: 1 day   RAI,RIPUDEEP M.D. [facility] 10/26/2012, 11:32 AM Pager: [phone]  If 7PM-7AM, please contact night-coverage [email or web address] Password [email or web address]

## 2012-10-27 DIAGNOSIS — I2510 Atherosclerotic heart disease of native coronary artery without angina pectoris: Secondary | ICD-10-CM

## 2012-10-27 LAB — BASIC METABOLIC PANEL
BUN: 20 mg/dL (ref 6–23)
CO2: 26 mEq/L (ref 19–32)
Chloride: 102 mEq/L (ref 96–112)
Creatinine, Ser: 1.15 mg/dL — ABNORMAL HIGH (ref 0.50–1.10)
GFR calc Af Amer: 50 mL/min — ABNORMAL LOW (ref >90)
Glucose, Bld: 113 mg/dL — ABNORMAL HIGH (ref 70–99)
Potassium: 3.7 mEq/L (ref 3.5–5.1)

## 2012-10-27 LAB — CBC
HCT: 29.2 % — ABNORMAL LOW (ref 36.0–46.0)
Hemoglobin: 10 g/dL — ABNORMAL LOW (ref 12.0–15.0)
MCH: 29.6 pg (ref 26.0–34.0)
MCHC: 34.2 g/dL (ref 30.0–36.0)
MCV: 86.4 fL (ref 78.0–100.0)
Platelets: 211 10*3/uL (ref 150–400)

## 2012-10-27 LAB — URINE CULTURE

## 2012-10-27 MED ORDER — CIPROFLOXACIN HCL 250 MG PO TABS: 250.0000 mg | ORAL_TABLET | Freq: Two times a day (BID) | ORAL | Status: DC

## 2012-10-27 NOTE — Discharge Summary (Signed)
Physician Discharge Summary  Patient ID: [name] MRN: [id number] DOB/AGE: 11/05/1929 79 y.o.  Admit date: 10/25/2012 Discharge date: 10/27/2012  Primary Care Physician:  Madeline Kayser, MD  Discharge Diagnoses:    Mechanical Fall with scalp laceration  . Atrial fibrillation . Chronic systolic congestive heart failure, NYHA class 2 with an ejection fraction of 30%-compensated . Leucocytosis . Escherichia coli UTI (urinary tract infection)  Consults: None    Discharge Medications:   Medication List    TAKE these medications       amiodarone 200 MG tablet  Commonly known as:  PACERONE  Take 200 mg by mouth every Monday, Wednesday, and Friday.     aspirin EC 81 MG tablet  Take 81 mg by mouth every evening.     Biotin 1000 MCG tablet  Take 1,000 mcg by mouth every evening.     CALCIUM 600 + D PO  Take 1 tablet by mouth every morning.     ciprofloxacin 250 MG tablet  Commonly known as:  CIPRO  Take 1 tablet (250 mg total) by mouth 2 (two) times daily. X 3 days     clopidogrel 75 MG tablet  Commonly known as:  PLAVIX  Take 75 mg by mouth every morning.     febuxostat 40 MG tablet  Commonly known as:  ULORIC  Take 80 mg by mouth every morning.     FORTEO Bushnell  Inject 20 mcg into the skin every evening.     furosemide 20 MG tablet  Commonly known as:  LASIX  Take 20 mg by mouth every morning.     levothyroxine 150 MCG tablet  Commonly known as:  SYNTHROID, LEVOTHROID  Take 150-225 mcg by mouth See admin instructions. Takes Sunday-Friday & (1.5 tab) on Sat     LEXAPRO 10 MG tablet  Generic drug:  escitalopram  Take 5 mg by mouth every morning.     metoprolol succinate 25 MG 24 hr tablet  Commonly known as:  TOPROL-XL  Take 25 mg by mouth every morning.     multivitamin with minerals Tabs  Take 1 tablet by mouth every morning.     nitroGLYCERIN 0.4 MG SL tablet  Commonly known as:  NITROSTAT  Place 0.4 mg under the tongue every 5 (five)  minutes as needed. For chest pain     pantoprazole 40 MG tablet  Commonly known as:  PROTONIX  Take 40 mg by mouth every morning.     potassium chloride 10 MEQ tablet  Commonly known as:  K-DUR,KLOR-CON  Take 10 mEq by mouth every morning.     ranolazine 500 MG 12 hr tablet  Commonly known as:  RANEXA  Take 500 mg by mouth 2 (two) times daily.     vitamin C 500 MG tablet  Commonly known as:  ASCORBIC ACID  Take 500 mg by mouth every morning.     Vitamin D (Ergocalciferol) 50000 UNITS Caps  Commonly known as:  DRISDOL  Take 50,000 Units by mouth every 7 (seven) days. Takes on Tuesday         Brief H and P: For complete details please refer to admission H and P, but in briefModine G Mendez is a 79 y.o. female history of CAD status post CABG status post stenting on antiplatelet agents, atrial fibrillation, not a candidate for Coumadin secondary to frequent falls, TIA/CVA, sick sinus syndrome status post pacemaker placement, hypothyroidism was brought to the ER patient had a fall at  her home. Patient stated that she was getting ready to go to doctor's office for Reclast injection when she suddenly fell backwards and hit her head. She did not lose consciousness or have any palpitation dizziness chest pain or shortness of breath. Patient was brought to the ER and she had significant bleed from the laceration site and ER physician had at least taken one hour to obtain hemostasis. Patient had sutures placed. CT head was showing hematoma otherwise chronic changes. Since patient has hematoma and had a lot of bleeding there was a concern that the bleeding may occur and wants patient be observed overnight. Patient had had similar falls previously and was placed in rehabilitation after her last discharge. Patient is also found to be having leukocytosis but patient denies any nausea vomiting chest pain shortness of breath or any productive cough. She did complain of increased frequency of  urination   Hospital Course:  Mechanical fall with Scalp laceration; no further bleeding at discharge. Patient was resumed on aspirin and Plavix and has been stable. Orthostatic vitals were negative. Physical therapy consult was obtained and recommended home health PT which was arranged by case manager. Patient has 24/7 supervision at home.  Atrial fibrillation:  Rate controlled, continue aspirin Plavix  - Not a Coumadin candidate secondary to 2 falls   Chronic systolic congestive heart failure, NYHA class 2 with an ejection fraction of 30%  - Currently compensated   Escherichia coli UTI : Patient was started on IV Rocephin, urine culture and activities were obtained  Per sensitivities, patient was dc'ed on ciprofloxacin for 3 days.  Hypothyroidism: Continue Synthroid    Day of Discharge BP 116/44  Pulse 71  Temp(Src) 98 F (36.7 C) (Oral)  Resp 18  Ht 5\' 7"  (1.702 m)  Wt 98.884 kg (218 lb)  BMI 34.14 kg/m2  SpO2 96%  Physical Exam: General: Alert and awake oriented x3 not in any acute distress. HEENT: anicteric sclera, pupils reactive to light and accommodation CVS: S1-S2 clear no murmur rubs or gallops Chest: clear to auscultation bilaterally, no wheezing rales or rhonchi Abdomen: soft nontender, nondistended, normal bowel sounds Extremities: no cyanosis, clubbing or edema noted bilaterally Neuro: Cranial nerves II-XII intact, no focal neurological deficits   The results of significant diagnostics from this hospitalization (including imaging, microbiology, ancillary and laboratory) are listed below for reference.    LAB RESULTS: Basic Metabolic Panel:  Recent Labs Lab 10/26/12 0535 10/27/12 0600  NA 139 138  K 3.7 3.7  CL 101 102  CO2 27 26  GLUCOSE 105* 113*  BUN 25* 20  CREATININE 1.15* 1.15*  CALCIUM 9.0 8.6   CBC:  Recent Labs Lab 10/25/12 2021 10/26/12 0535 10/27/12 0600  WBC 17.2* 10.5 8.2  NEUTROABS 14.4*  --   --   HGB 11.0* 11.4* 10.0*   HCT 32.4* 33.5* 29.2*  MCV 88.0 87.0 86.4  PLT 276 249 211   Cardiac Enzymes:  Recent Labs Lab 10/25/12 1742  TROPONINI <0.30   BNP: No components found with this basename: POCBNP,  CBG: No results found for this basename: GLUCAP,  in the last 168 hours  Significant Diagnostic Studies:  Dg Thoracic Spine W/swimmers  10/25/2012  *RADIOLOGY REPORT*  Clinical Data: Fall.  Upper back pain.  THORACIC SPINE - 2 VIEW + SWIMMERS  Comparison: CT chest from 06/21/2011  Findings: Prior CABG noted.  Pacer leads are in place.  Cervical spondylosis is observed along with thoracic spondylosis. Compression fractures at T6, T7, and T8  are similar to the prior exam from 2012.  There is increase in T9 compression compared to that prior exam.  T12 and L1 compression fractures appear chronic. The T3 compression fracture is new compared to the prior 2012 examination.  There is mild dextroconvex thoracolumbar scoliosis.  No subluxation observed in the thoracic spine.  IMPRESSION:  1.  Increase in the T9 and T3 compression fractures compared to the prior exam from 2012.  These are not necessarily acute. 2.  Old, stable T6, T7, T8, T12, and L1 compression fractures.   Original Report Authenticated By: Gaylyn Rong, M.D.    Ct Head Wo Contrast  10/25/2012  *RADIOLOGY REPORT*  Clinical Data:  History of trauma from a fall.  Laceration on the posterior scalp.  Neck pain.  CT HEAD WITHOUT CONTRAST CT CERVICAL SPINE WITHOUT CONTRAST  Technique:  Multidetector CT imaging of the head and cervical spine was performed following the standard protocol without intravenous contrast.  Multiplanar CT image reconstructions of the cervical spine were also generated.  Comparison:  Head CT 07/30/2012.  Cervical spine CT 07/30/2012.  CT HEAD  Findings: There are areas of soft tissue swelling in the left frontal scalp and the right occipital scalp.  The right occipital scalp and particular has a large amount of high attenuation in  the soft tissues, compatible with a large hematoma.  No acute displaced skull fracture is identified.  There are no acute intracranial abnormalities.  Specifically, no signs of acute post-traumatic intracranial hemorrhage, no definite mass, mass effect, hydrocephalus or abnormal intra or extra-axial fluid collections. Again noted is a large area of encephalomalacia in the left temporal lobe, related to prior left MCA territory infarction. Visualized paranasal sinuses and mastoids are well pneumatized, although the mastoids are hypoplastic, and there is a small amount of mucosal thickening within the left sphenoid sinuses.  IMPRESSION: 1.  Large right occipital scalp hematoma.  Small left frontal scalp contusion.  No acute displaced skull fracture or acute intracranial abnormality. 2.  Appearance of the brain is essentially unchanged compared to the prior study, as above, including a large area of encephalomalacia in the left temporal lobe.  CT CERVICAL SPINE  Findings: No acute displaced cervical spine fractures are noted. There is multilevel degenerative disc disease, most severe at C5- C6.  Multilevel facet arthropathy is also noted.  These degenerative changes result in 3 mm of anterolisthesis of C4 upon C5 and 3 mm of retrolisthesis of C5 upon C6.  Alignment is otherwise anatomic.  Prevertebral soft tissues are normal. Visualized portions of the upper thorax are remarkable for mild bilateral apical pleuroparenchymal thickening, presumably post infectious scarring.  IMPRESSION: 1.  No evidence of significant acute traumatic injury to the cervical spine. 2.  Multilevel degenerative disc disease and cervical spondylosis, as above.   Original Report Authenticated By: Trudie Reed, M.D.    Ct Cervical Spine Wo Contrast  10/25/2012  *RADIOLOGY REPORT*  Clinical Data:  History of trauma from a fall.  Laceration on the posterior scalp.  Neck pain.  CT HEAD WITHOUT CONTRAST CT CERVICAL SPINE WITHOUT CONTRAST   Technique:  Multidetector CT imaging of the head and cervical spine was performed following the standard protocol without intravenous contrast.  Multiplanar CT image reconstructions of the cervical spine were also generated.  Comparison:  Head CT 07/30/2012.  Cervical spine CT 07/30/2012.  CT HEAD  Findings: There are areas of soft tissue swelling in the left frontal scalp and the right occipital scalp.  The right  occipital scalp and particular has a large amount of high attenuation in the soft tissues, compatible with a large hematoma.  No acute displaced skull fracture is identified.  There are no acute intracranial abnormalities.  Specifically, no signs of acute post-traumatic intracranial hemorrhage, no definite mass, mass effect, hydrocephalus or abnormal intra or extra-axial fluid collections. Again noted is a large area of encephalomalacia in the left temporal lobe, related to prior left MCA territory infarction. Visualized paranasal sinuses and mastoids are well pneumatized, although the mastoids are hypoplastic, and there is a small amount of mucosal thickening within the left sphenoid sinuses.  IMPRESSION: 1.  Large right occipital scalp hematoma.  Small left frontal scalp contusion.  No acute displaced skull fracture or acute intracranial abnormality. 2.  Appearance of the brain is essentially unchanged compared to the prior study, as above, including a large area of encephalomalacia in the left temporal lobe.  CT CERVICAL SPINE  Findings: No acute displaced cervical spine fractures are noted. There is multilevel degenerative disc disease, most severe at C5- C6.  Multilevel facet arthropathy is also noted.  These degenerative changes result in 3 mm of anterolisthesis of C4 upon C5 and 3 mm of retrolisthesis of C5 upon C6.  Alignment is otherwise anatomic.  Prevertebral soft tissues are normal. Visualized portions of the upper thorax are remarkable for mild bilateral apical pleuroparenchymal thickening,  presumably post infectious scarring.  IMPRESSION: 1.  No evidence of significant acute traumatic injury to the cervical spine. 2.  Multilevel degenerative disc disease and cervical spondylosis, as above.   Original Report Authenticated By: Trudie Reed, M.D.    Dg Chest Port 1 View  10/26/2012  *RADIOLOGY REPORT*  Clinical Data: Pulmonary infiltrates.  PORTABLE CHEST - 1 VIEW  Comparison: 08/02/2012  Findings: Dual lead pacer remains in place.  Coronary stents noted along with evidence of prior median sternotomy.  The patient is rotated to the left on today's exam, resulting in reduced diagnostic sensitivity and specificity.   Low lung volumes noted.  Mild cardiomegaly is present.  The lungs appear clear. Prior left lower lobe airspace opacity from January appears to have resolved.  Healed right proximal humeral fracture noted.  IMPRESSION:  1.  Mild cardiomegaly, without edema.   Original Report Authenticated By: Gaylyn Rong, M.D.     2D ECHO:   Disposition and Follow-up:     Discharge Orders   Future Orders Complete By Expires     Diet - low sodium heart healthy  As directed     Increase activity slowly  As directed         DISPOSITION: Home with family  DIET: Heart healthy diet ACTIVITY: As tolerated   DISCHARGE FOLLOW-UP Follow-up Information   Follow up with [facility] (Home Health Physical Therapy and Occupational Therapy)    Contact information:   [phone]      Follow up with PERINI,MARK A, MD. Schedule an appointment as soon as possible for a visit in 2 weeks. (for hospital follow-up)    Contact information:   [address] [place] [address] [phone]       Time spent on Discharge: 25 minutes  Signed:   RAI,RIPUDEEP M.D. [facility] 10/27/2012, 10:24 AM Pager: [phone]

## 2012-10-27 NOTE — Progress Notes (Signed)
   CARE MANAGEMENT NOTE 10/27/2012  Patient:  [name], [name]   Account Number:  [id number]  Date Initiated:  10/27/2012  Documentation initiated by:  Magnus Ivan  Subjective/Objective Assessment:   Fall with laceration to head     Action/Plan:   Home Health   Anticipated DC Date:  10/27/2012   Anticipated DC Plan:  HOME W HOME HEALTH SERVICES      DC Planning Services  CM consult      [facility] Choice  HOME HEALTH   Choice offered to / List presented to:          [facility] arranged  HH-2 PT  HH-3 OT      [facility] agency  [facility]   Status of service:  Completed, signed off Medicare Important Message given?   (If response is "NO", the following Medicare IM given date fields will be blank) Date Medicare IM given:   Date Additional Medicare IM given:    Discharge Disposition:  HOME W HOME HEALTH SERVICES  Per UR Regulation:    If discussed at Long Length of Stay Meetings, dates discussed:    Comments:  10/26/12 Spoke with patient and she did acknowlege having previous home care services  but could not rememeber which agency and had no preference for an agency. Spoke with Jacki Cones with [facility] who indicated they had serviced patient in the past and was open to accept referral for Physical and Occupational therapy today. She did advise that it would be Tuesday when they would contact patient. Notified patient and patients nurse that her services would be provided by [place] and they would contact her by Tuesday. Patient stated, this would be fine. Magnus Ivan, RN, CCM, Case Mgmt [phone]

## 2012-10-29 ENCOUNTER — Emergency Department (HOSPITAL_COMMUNITY): Payer: Medicare Other

## 2012-10-29 ENCOUNTER — Observation Stay (HOSPITAL_COMMUNITY)
Admission: EM | Admit: 2012-10-29 | Discharge: 2012-10-31 | Disposition: A | Discharge status: Home / Self Care | Payer: Medicare Other | Attending: Cardiology | Admitting: Cardiology

## 2012-10-29 ENCOUNTER — Encounter (HOSPITAL_COMMUNITY): Payer: Self-pay | Admitting: Emergency Medicine

## 2012-10-29 ENCOUNTER — Observation Stay (HOSPITAL_COMMUNITY): Payer: Medicare Other

## 2012-10-29 DIAGNOSIS — R0789 Other chest pain: Principal | ICD-10-CM | POA: Insufficient documentation

## 2012-10-29 DIAGNOSIS — Z8673 Personal history of transient ischemic attack (TIA), and cerebral infarction without residual deficits: Secondary | ICD-10-CM | POA: Insufficient documentation

## 2012-10-29 DIAGNOSIS — R262 Difficulty in walking, not elsewhere classified: Secondary | ICD-10-CM | POA: Insufficient documentation

## 2012-10-29 DIAGNOSIS — Z951 Presence of aortocoronary bypass graft: Secondary | ICD-10-CM | POA: Insufficient documentation

## 2012-10-29 DIAGNOSIS — E785 Hyperlipidemia, unspecified: Secondary | ICD-10-CM

## 2012-10-29 DIAGNOSIS — Z87311 Personal history of (healed) other pathological fracture: Secondary | ICD-10-CM | POA: Insufficient documentation

## 2012-10-29 DIAGNOSIS — R072 Precordial pain: Secondary | ICD-10-CM

## 2012-10-29 DIAGNOSIS — M549 Dorsalgia, unspecified: Secondary | ICD-10-CM

## 2012-10-29 DIAGNOSIS — R079 Chest pain, unspecified: Secondary | ICD-10-CM

## 2012-10-29 DIAGNOSIS — D62 Acute posthemorrhagic anemia: Secondary | ICD-10-CM | POA: Diagnosis present

## 2012-10-29 DIAGNOSIS — I2510 Atherosclerotic heart disease of native coronary artery without angina pectoris: Secondary | ICD-10-CM

## 2012-10-29 DIAGNOSIS — W19XXXA Unspecified fall, initial encounter: Secondary | ICD-10-CM | POA: Insufficient documentation

## 2012-10-29 DIAGNOSIS — Z9181 History of falling: Secondary | ICD-10-CM | POA: Insufficient documentation

## 2012-10-29 DIAGNOSIS — I509 Heart failure, unspecified: Secondary | ICD-10-CM | POA: Insufficient documentation

## 2012-10-29 DIAGNOSIS — I4891 Unspecified atrial fibrillation: Secondary | ICD-10-CM

## 2012-10-29 DIAGNOSIS — R0609 Other forms of dyspnea: Secondary | ICD-10-CM

## 2012-10-29 DIAGNOSIS — S22009A Unspecified fracture of unspecified thoracic vertebra, initial encounter for closed fracture: Secondary | ICD-10-CM | POA: Insufficient documentation

## 2012-10-29 DIAGNOSIS — J449 Chronic obstructive pulmonary disease, unspecified: Secondary | ICD-10-CM | POA: Insufficient documentation

## 2012-10-29 DIAGNOSIS — I252 Old myocardial infarction: Secondary | ICD-10-CM | POA: Insufficient documentation

## 2012-10-29 DIAGNOSIS — R0602 Shortness of breath: Secondary | ICD-10-CM | POA: Insufficient documentation

## 2012-10-29 DIAGNOSIS — J4489 Other specified chronic obstructive pulmonary disease: Secondary | ICD-10-CM | POA: Insufficient documentation

## 2012-10-29 DIAGNOSIS — M810 Age-related osteoporosis without current pathological fracture: Secondary | ICD-10-CM | POA: Insufficient documentation

## 2012-10-29 DIAGNOSIS — I10 Essential (primary) hypertension: Secondary | ICD-10-CM

## 2012-10-29 LAB — COMPREHENSIVE METABOLIC PANEL
ALT: 19 U/L (ref 0–35)
Alkaline Phosphatase: 99 U/L (ref 39–117)
CO2: 27 mEq/L (ref 19–32)
GFR calc Af Amer: 53 mL/min — ABNORMAL LOW (ref >90)
GFR calc non Af Amer: 45 mL/min — ABNORMAL LOW (ref >90)
Glucose, Bld: 86 mg/dL (ref 70–99)
Potassium: 4.1 mEq/L (ref 3.5–5.1)
Sodium: 137 mEq/L (ref 135–145)

## 2012-10-29 LAB — CBC
HCT: 32.7 % — ABNORMAL LOW (ref 36.0–46.0)
Hemoglobin: 11.1 g/dL — ABNORMAL LOW (ref 12.0–15.0)
WBC: 11.9 10*3/uL — ABNORMAL HIGH (ref 4.0–10.5)

## 2012-10-29 LAB — URINALYSIS, ROUTINE W REFLEX MICROSCOPIC
Bilirubin Urine: NEGATIVE
Hgb urine dipstick: NEGATIVE
Protein, ur: NEGATIVE mg/dL
Urobilinogen, UA: 0.2 mg/dL (ref 0.0–1.0)

## 2012-10-29 MED ORDER — ONDANSETRON HCL 4 MG/2ML IJ SOLN: 4.0000 mg | Freq: Four times a day (QID) | INTRAMUSCULAR | Status: DC | PRN

## 2012-10-29 MED ORDER — ALPRAZOLAM 0.25 MG PO TABS
0.2500 mg | ORAL_TABLET | Freq: Two times a day (BID) | ORAL | Status: DC | PRN
  Filled 2012-10-29: qty 1

## 2012-10-29 MED ORDER — POTASSIUM CHLORIDE CRYS ER 10 MEQ PO TBCR
10.0000 meq | EXTENDED_RELEASE_TABLET | Freq: Every morning | ORAL | Status: DC
  Administered 2012-10-30 – 2012-10-31 (×2): 10 meq via ORAL
  Filled 2012-10-29 (×2): qty 1

## 2012-10-29 MED ORDER — ADULT MULTIVITAMIN W/MINERALS CH
1.0000 | ORAL_TABLET | Freq: Every morning | ORAL | Status: DC
  Administered 2012-10-30 – 2012-10-31 (×2): 1 via ORAL
  Filled 2012-10-29 (×2): qty 1

## 2012-10-29 MED ORDER — LEVOTHYROXINE SODIUM 150 MCG PO TABS: 150.0000 ug | ORAL_TABLET | ORAL | Status: DC

## 2012-10-29 MED ORDER — NITROGLYCERIN 0.4 MG SL SUBL: 0.4000 mg | SUBLINGUAL_TABLET | SUBLINGUAL | Status: DC | PRN

## 2012-10-29 MED ORDER — BIOTIN 1000 MCG PO TABS: 1000.0000 ug | ORAL_TABLET | Freq: Every evening | ORAL | Status: DC

## 2012-10-29 MED ORDER — CIPROFLOXACIN HCL 250 MG PO TABS
250.0000 mg | ORAL_TABLET | Freq: Two times a day (BID) | ORAL | Status: DC
  Administered 2012-10-30 – 2012-10-31 (×4): 250 mg via ORAL
  Filled 2012-10-29 (×5): qty 1

## 2012-10-29 MED ORDER — METOPROLOL SUCCINATE ER 25 MG PO TB24
25.0000 mg | ORAL_TABLET | Freq: Every morning | ORAL | Status: DC
  Administered 2012-10-30 – 2012-10-31 (×2): 25 mg via ORAL
  Filled 2012-10-29 (×2): qty 1

## 2012-10-29 MED ORDER — ACETAMINOPHEN 325 MG PO TABS
650.0000 mg | ORAL_TABLET | ORAL | Status: DC | PRN
  Administered 2012-10-30 – 2012-10-31 (×2): 650 mg via ORAL
  Filled 2012-10-29 (×3): qty 2

## 2012-10-29 MED ORDER — LEVOTHYROXINE SODIUM 150 MCG PO TABS
150.0000 ug | ORAL_TABLET | ORAL | Status: DC
  Administered 2012-10-30 – 2012-10-31 (×2): 150 ug via ORAL
  Filled 2012-10-29 (×4): qty 1

## 2012-10-29 MED ORDER — ASPIRIN EC 81 MG PO TBEC
81.0000 mg | DELAYED_RELEASE_TABLET | Freq: Every evening | ORAL | Status: DC
Start: 2012-10-29 — End: 2012-10-31
  Administered 2012-10-29 – 2012-10-31 (×3): 81 mg via ORAL
  Filled 2012-10-29 (×3): qty 1

## 2012-10-29 MED ORDER — FEBUXOSTAT 40 MG PO TABS
80.0000 mg | ORAL_TABLET | Freq: Every morning | ORAL | Status: DC
  Administered 2012-10-30 – 2012-10-31 (×2): 80 mg via ORAL
  Filled 2012-10-29 (×2): qty 2

## 2012-10-29 MED ORDER — CLOPIDOGREL BISULFATE 75 MG PO TABS
75.0000 mg | ORAL_TABLET | Freq: Every day | ORAL | Status: DC
  Administered 2012-10-30 – 2012-10-31 (×2): 75 mg via ORAL
  Filled 2012-10-29 (×3): qty 1

## 2012-10-29 MED ORDER — CLOPIDOGREL BISULFATE 75 MG PO TABS: 75.0000 mg | ORAL_TABLET | Freq: Every morning | ORAL | Status: DC

## 2012-10-29 MED ORDER — IOHEXOL 350 MG/ML SOLN
100.0000 mL | Freq: Once | INTRAVENOUS | Status: AC | PRN
  Administered 2012-10-29: 100 mL via INTRAVENOUS

## 2012-10-29 MED ORDER — SODIUM CHLORIDE 0.9 % IJ SOLN: 3.0000 mL | INTRAMUSCULAR | Status: DC | PRN

## 2012-10-29 MED ORDER — PANTOPRAZOLE SODIUM 40 MG PO TBEC
40.0000 mg | DELAYED_RELEASE_TABLET | Freq: Every morning | ORAL | Status: DC
  Administered 2012-10-30 – 2012-10-31 (×2): 40 mg via ORAL
  Filled 2012-10-29: qty 1

## 2012-10-29 MED ORDER — ESCITALOPRAM OXALATE 5 MG PO TABS
5.0000 mg | ORAL_TABLET | Freq: Every morning | ORAL | Status: DC
  Administered 2012-10-30 – 2012-10-31 (×2): 5 mg via ORAL
  Filled 2012-10-29 (×3): qty 1

## 2012-10-29 MED ORDER — FUROSEMIDE 20 MG PO TABS
20.0000 mg | ORAL_TABLET | Freq: Every morning | ORAL | Status: DC
  Administered 2012-10-30 – 2012-10-31 (×2): 20 mg via ORAL
  Filled 2012-10-29 (×2): qty 1

## 2012-10-29 MED ORDER — HEPARIN (PORCINE) IN NACL 100-0.45 UNIT/ML-% IJ SOLN
1300.0000 [IU]/h | INTRAMUSCULAR | Status: DC
  Administered 2012-10-29: 1300 [IU]/h via INTRAVENOUS
  Filled 2012-10-29: qty 250

## 2012-10-29 MED ORDER — ZOLPIDEM TARTRATE 5 MG PO TABS: 5.0000 mg | ORAL_TABLET | Freq: Every evening | ORAL | Status: DC | PRN

## 2012-10-29 MED ORDER — CALCIUM CARBONATE-VITAMIN D 500-200 MG-UNIT PO TABS
1.0000 | ORAL_TABLET | Freq: Every day | ORAL | Status: DC
  Administered 2012-10-30 – 2012-10-31 (×2): 1 via ORAL
  Filled 2012-10-29 (×2): qty 1

## 2012-10-29 MED ORDER — SODIUM CHLORIDE 0.9 % IJ SOLN
3.0000 mL | Freq: Two times a day (BID) | INTRAMUSCULAR | Status: DC
  Administered 2012-10-29 – 2012-10-31 (×3): 3 mL via INTRAVENOUS

## 2012-10-29 MED ORDER — ACETAMINOPHEN 500 MG PO TABS
1000.0000 mg | ORAL_TABLET | Freq: Three times a day (TID) | ORAL | Status: DC
  Administered 2012-10-29 – 2012-10-31 (×6): 1000 mg via ORAL
  Filled 2012-10-29 (×8): qty 2

## 2012-10-29 MED ORDER — AMIODARONE HCL 200 MG PO TABS
200.0000 mg | ORAL_TABLET | ORAL | Status: DC
  Administered 2012-10-30: 200 mg via ORAL
  Filled 2012-10-29: qty 1

## 2012-10-29 MED ORDER — VITAMIN C 500 MG PO TABS
500.0000 mg | ORAL_TABLET | Freq: Every morning | ORAL | Status: DC
  Administered 2012-10-30 – 2012-10-31 (×2): 500 mg via ORAL
  Filled 2012-10-29 (×2): qty 1

## 2012-10-29 MED ORDER — ENOXAPARIN SODIUM 40 MG/0.4ML ~~LOC~~ SOLN: 40.0000 mg | SUBCUTANEOUS | Status: DC

## 2012-10-29 MED ORDER — CALCIUM CARBONATE-VITAMIN D 600-400 MG-UNIT PO TABS: 1.0000 | ORAL_TABLET | Freq: Every day | ORAL | Status: DC

## 2012-10-29 MED ORDER — VITAMIN D (ERGOCALCIFEROL) 1.25 MG (50000 UNIT) PO CAPS
50000.0000 [IU] | ORAL_CAPSULE | ORAL | Status: DC
  Administered 2012-10-30: 50000 [IU] via ORAL
  Filled 2012-10-29 (×3): qty 1

## 2012-10-29 MED ORDER — HEPARIN BOLUS VIA INFUSION
4000.0000 [IU] | Freq: Once | INTRAVENOUS | Status: AC
  Administered 2012-10-29: 4000 [IU] via INTRAVENOUS

## 2012-10-29 MED ORDER — LEVOTHYROXINE SODIUM 25 MCG PO TABS: 225.0000 ug | ORAL_TABLET | ORAL | Status: DC

## 2012-10-29 MED ORDER — SODIUM CHLORIDE 0.9 % IV SOLN: 250.0000 mL | INTRAVENOUS | Status: DC | PRN

## 2012-10-29 MED ORDER — RANOLAZINE ER 500 MG PO TB12
500.0000 mg | ORAL_TABLET | Freq: Two times a day (BID) | ORAL | Status: DC
  Administered 2012-10-29 – 2012-10-31 (×4): 500 mg via ORAL
  Filled 2012-10-29 (×5): qty 1

## 2012-10-29 NOTE — Consult Note (Signed)
ANTICOAGULATION CONSULT NOTE - Initial Consult  Pharmacy Consult for Heparin Indication: r/o PE  Allergies  Allergen Reactions  . Codeine Itching and Rash  . Statins Itching and Rash  . Sulfa Antibiotics Itching and Rash    Patient Measurements: Height: 5' 6.93" (170 cm) Weight: 218 lb 0.6 oz (98.9 kg) IBW/kg (Calculated) : 61.44 Heparin Dosing Weight: ~83kg  Vital Signs: Temp: 97.8 F (36.6 C) (04/29 1227) Temp src: Oral (04/29 1227) BP: 125/51 mmHg (04/29 2000) Pulse Rate: 70 (04/29 2000)  Labs:  Recent Labs  10/27/12 0600 10/29/12 1400  HGB 10.0* 11.1*  HCT 29.2* 32.7*  PLT 211 286  CREATININE 1.15* 1.11*  TROPONINI  --  <0.30    Estimated Creatinine Clearance: 47.9 ml/min (by C-G formula based on Cr of 1.11).   Medical History: Past Medical History  Diagnosis Date  . CHF (congestive heart failure)   . Hypertension   . CVA (cerebral vascular accident)   . Hypothyroidism   . COPD (chronic obstructive pulmonary disease)   . Sick sinus syndrome     status post pacemaker implantation  . Paroxysmal atrial fibrillation   . Subendocardial myocardial infarction   . Subdural hematoma   . CAD (coronary artery disease)     post PTCA and stenting of the LAD and Status post maze procedure  . History of gastrointestinal bleeding   . GERD (gastroesophageal reflux disease)   . Osteoporosis     Medications:  No anticoagulants pta  Assessment: 83yof presented to the ED with CP and SOB. D-dimer is elevated. She will begin heparin for possible PE. CT chest pending. Labs wnl.  Goal of Therapy:  Heparin level 0.3-0.7 units/ml Monitor platelets by anticoagulation protocol: Yes   Plan:  1) Heparin bolus 4000 units x 1 2) Heparin drip at 1300 units/hr 3) 8 hour heparin level 4) Daily heparin level and CBC 5) Follow up CT chest  Fredrik Rigger 10/29/2012,8:31 PM

## 2012-10-29 NOTE — ED Notes (Signed)
Patient meal tray arrived. Pt eating at beside with family. Waiting for bed placement.

## 2012-10-29 NOTE — ED Notes (Addendum)
Pt. Back from CT. Family stating "something is not right, she's really tired". Pt. Alert and oriented per baseline, PERRLA, grips equal, no facial droop. Pt. Place on oxygen for comfort. Pt. Denies chest pain, SOB, laying in bed comfortably.

## 2012-10-29 NOTE — ED Provider Notes (Signed)
History     CSN: [id number]  Arrival date & time 10/29/12  1213   First MD Initiated Contact with Patient 10/29/12 1243      Chief Complaint  Patient presents with  . Chest Pain  . Shortness of Breath    (Consider location/radiation/quality/duration/timing/severity/associated sxs/prior treatment) Patient is a 79 y.o. female presenting with chest pain and shortness of breath. The history is provided by the patient.  Chest Pain Associated symptoms: shortness of breath   Associated symptoms: no abdominal pain, no back pain, no cough, no fever, no headache, no palpitations and not vomiting   Shortness of Breath Associated symptoms: chest pain   Associated symptoms: no abdominal pain, no cough, no fever, no headaches, no neck pain, no rash and no vomiting   pt with hx cad s/p cabg several yrs ago, chf, presents w c/o chest pain and sob w any activity/exertion in past couple days. No chest pain currently. Pt v poor historian. Denies cough or fever. Family state any activity even standing or trying to use bedside commode causes sob/cp.  No change in meds/compliant w meds. No fever or chills. No increase in leg swelling.     Past Medical History  Diagnosis Date  . CHF (congestive heart failure)   . Hypertension   . CVA (cerebral vascular accident)   . Hypothyroidism   . COPD (chronic obstructive pulmonary disease)   . Sick sinus syndrome     status post pacemaker implantation  . Paroxysmal atrial fibrillation   . Subendocardial myocardial infarction   . Subdural hematoma   . CAD (coronary artery disease)     post PTCA and stenting of the LAD and Status post maze procedure  . History of gastrointestinal bleeding   . GERD (gastroesophageal reflux disease)   . Osteoporosis     Past Surgical History  Procedure Laterality Date  . Coronary artery bypass graft  06/2009  . Cardiac catheterization  08/17/2009    Est. EF is 35% to 40%  . Pacemaker insertion      MDT implanted by Dr  Reyes Ivan for SSS    Family History  Problem Relation Age of Onset  . Lung cancer Father   . Heart attack Mother   . Coronary artery disease Mother   . Coronary artery disease Brother     with CABG  . Heart attack Brother   . Heart attack Brother   . Lung cancer Brother   . Coronary artery disease Sister   . Hypertension Mother   . Hypertension Brother   . Hypertension Brother     History  Substance Use Topics  . Smoking status: Never Smoker   . Smokeless tobacco: Never Used  . Alcohol Use: No    OB History   Grav Para Term Preterm Abortions TAB SAB Ect Mult Living                  Review of Systems  Constitutional: Negative for fever and chills.  HENT: Negative for neck pain.   Eyes: Negative for redness.  Respiratory: Positive for shortness of breath. Negative for cough.   Cardiovascular: Positive for chest pain. Negative for palpitations and leg swelling.  Gastrointestinal: Negative for vomiting and abdominal pain.  Genitourinary: Negative for flank pain.  Musculoskeletal: Negative for back pain.  Skin: Negative for rash.  Neurological: Negative for headaches.  Hematological: Does not bruise/bleed easily.  Psychiatric/Behavioral: Negative for confusion.    Allergies  Codeine; Statins; and Sulfa antibiotics  Home  Medications   Current Outpatient Rx  Name  Route  Sig  Dispense  Refill  . amiodarone (PACERONE) 200 MG tablet   Oral   Take 200 mg by mouth every Monday, Wednesday, and Friday.         Marland Kitchen aspirin EC 81 MG tablet   Oral   Take 81 mg by mouth every evening.          . Biotin 1000 MCG tablet   Oral   Take 1,000 mcg by mouth every evening.         . Calcium Carbonate-Vitamin D (CALCIUM 600 + D PO)   Oral   Take 1 tablet by mouth every morning.         . ciprofloxacin (CIPRO) 250 MG tablet   Oral   Take 1 tablet (250 mg total) by mouth 2 (two) times daily. X 3 days   6 tablet   0   . clopidogrel (PLAVIX) 75 MG tablet   Oral    Take 75 mg by mouth every morning.         . escitalopram (LEXAPRO) 10 MG tablet   Oral   Take 5 mg by mouth every morning.          . febuxostat (ULORIC) 40 MG tablet   Oral   Take 80 mg by mouth every morning.         . furosemide (LASIX) 20 MG tablet   Oral   Take 20 mg by mouth every morning.          Marland Kitchen levothyroxine (SYNTHROID, LEVOTHROID) 150 MCG tablet   Oral   Take 150-225 mcg by mouth See admin instructions. Takes Sunday-Friday & (1.5 tab) on Sat         . metoprolol succinate (TOPROL-XL) 25 MG 24 hr tablet   Oral   Take 25 mg by mouth every morning.          . Multiple Vitamin (MULTIVITAMIN WITH MINERALS) TABS   Oral   Take 1 tablet by mouth every morning.          . nitroGLYCERIN (NITROSTAT) 0.4 MG SL tablet   Sublingual   Place 0.4 mg under the tongue every 5 (five) minutes as needed. For chest pain         . pantoprazole (PROTONIX) 40 MG tablet   Oral   Take 40 mg by mouth every morning.          . potassium chloride (K-DUR,KLOR-CON) 10 MEQ tablet   Oral   Take 10 mEq by mouth every morning.          . ranolazine (RANEXA) 500 MG 12 hr tablet   Oral   Take 500 mg by mouth 2 (two) times daily.         . Teriparatide, Recombinant, (FORTEO Welling)   Subcutaneous   Inject 20 mcg into the skin every evening.         . vitamin C (ASCORBIC ACID) 500 MG tablet   Oral   Take 500 mg by mouth every morning.          . Vitamin D, Ergocalciferol, (DRISDOL) 50000 UNITS CAPS   Oral   Take 50,000 Units by mouth every 7 (seven) days. Takes on Tuesday           BP 140/73  Pulse 69  Temp(Src) 97.8 F (36.6 C) (Oral)  Resp 16  SpO2 99%  Physical Exam  Nursing note and vitals  reviewed. Constitutional: She appears well-developed and well-nourished. No distress.  HENT:  Nose: Nose normal.  Mouth/Throat: Oropharynx is clear and moist.  Eyes: Conjunctivae are normal. No scleral icterus.  Neck: Neck supple. No JVD present.  No tracheal deviation present.  Cardiovascular: Normal rate, regular rhythm, normal heart sounds and intact distal pulses.   Pulmonary/Chest: Effort normal and breath sounds normal. No respiratory distress. She exhibits tenderness.  Small yellowish/old bruise to superior aspect sternum.   Abdominal: Soft. Normal appearance and bowel sounds are normal. She exhibits no distension. There is no tenderness.  Musculoskeletal: She exhibits no edema and no tenderness.  Neurological: She is alert.  Skin: Skin is warm and dry. No rash noted.  Psychiatric: She has a normal mood and affect.    ED Course  Procedures (including critical care time)  Results for orders placed during the hospital encounter of 10/29/12  CBC      Result Value Range   WBC 11.9 (*) 4.0 - 10.5 K/uL   RBC 3.70 (*) 3.87 - 5.11 MIL/uL   Hemoglobin 11.1 (*) 12.0 - 15.0 g/dL   HCT 40.9 (*) 81.1 - 91.4 %   MCV 88.4  78.0 - 100.0 fL   MCH 30.0  26.0 - 34.0 pg   MCHC 33.9  30.0 - 36.0 g/dL   RDW 78.2 (*) 95.6 - 21.3 %   Platelets 286  150 - 400 K/uL  PRO B NATRIURETIC PEPTIDE      Result Value Range   Pro B Natriuretic peptide (BNP) 415.5  0 - 450 pg/mL  COMPREHENSIVE METABOLIC PANEL      Result Value Range   Sodium 137  135 - 145 mEq/L   Potassium 4.1  3.5 - 5.1 mEq/L   Chloride 99  96 - 112 mEq/L   CO2 27  19 - 32 mEq/L   Glucose, Bld 86  70 - 99 mg/dL   BUN 25 (*) 6 - 23 mg/dL   Creatinine, Ser 0.86 (*) 0.50 - 1.10 mg/dL   Calcium 9.8  8.4 - 57.8 mg/dL   Total Protein 7.5  6.0 - 8.3 g/dL   Albumin 3.9  3.5 - 5.2 g/dL   AST 24  0 - 37 U/L   ALT 19  0 - 35 U/L   Alkaline Phosphatase 99  39 - 117 U/L   Total Bilirubin 0.4  0.3 - 1.2 mg/dL   GFR calc non Af Amer 45 (*) >90 mL/min   GFR calc Af Amer 53 (*) >90 mL/min  TROPONIN I      Result Value Range   Troponin I <0.30  <0.30 ng/mL  URINALYSIS, ROUTINE W REFLEX MICROSCOPIC      Result Value Range   Color, Urine YELLOW  YELLOW   APPearance CLEAR  CLEAR    Specific Gravity, Urine 1.010  1.005 - 1.030   pH 6.5  5.0 - 8.0   Glucose, UA NEGATIVE  NEGATIVE mg/dL   Hgb urine dipstick NEGATIVE  NEGATIVE   Bilirubin Urine NEGATIVE  NEGATIVE   Ketones, ur NEGATIVE  NEGATIVE mg/dL   Protein, ur NEGATIVE  NEGATIVE mg/dL   Urobilinogen, UA 0.2  0.0 - 1.0 mg/dL   Nitrite NEGATIVE  NEGATIVE   Leukocytes, UA NEGATIVE  NEGATIVE   Dg Chest 2 View  10/29/2012  *RADIOLOGY REPORT*  Clinical Data: Chest pain, shortness of breath.  CHEST - 2 VIEW  Comparison: 10/26/2012  Findings: Left pacer is in place with leads in the right atrium  and right ventricle.  Mild cardiomegaly.  Prior CABG.  Lungs are clear. No effusions.  No acute bony abnormality.  IMPRESSION: Cardiomegaly.  No acute cardiopulmonary disease.   Original Report Authenticated By: Charlett Nose, M.D.          MDM  Iv ns. O2. Monitor. Ecg. Cxr.  Reviewed nursing notes and prior charts for additional history.    Date: 10/29/2012  Rate: 72  Rhythm: atrial paced rhythm  QRS Axis: normal  Intervals: atrial paced, prolonged av conduction  ST/T Wave abnormalities: nonspecific T wave changes  Conduction Disutrbances:first-degree A-V block   Narrative Interpretation:   Old EKG Reviewed: unchanged  Trop neg. Pt breathing comfortable.  Pt/family indicate pt of [facility].   Given hx cad/cabg/cath, new/recurrent cp w sob w exertion, leb card asked to evaluate in ed - discussed pt with cardmaster - they will see in ed.         Suzi Roots, MD 10/29/12 [phone]

## 2012-10-29 NOTE — ED Notes (Signed)
EMT attempt to draw labs, unsuccessful.

## 2012-10-29 NOTE — H&P (Addendum)
Cardiology H&P   Patient ID: [name] MRN: [id number], DOB/AGE: 1930/03/31 79 y.o. Date of Encounter: 10/29/2012  Primary Physician: Ezequiel Kayser, MD Primary Cardiologist: N[facility]  Chief Complaint:  Chest pain and SOB  HPI: [name] is a 79 y.o. female with with history of CAD and CHF. Pt fell 4/25 and was admitted till 4/27. She was doing well at home and then had sudden onset of SSCP and SOB. After d/c she was ambulating to the bathroom and in the home before then. Once she developed chest pain and SOB, she was unable to ambulate without assistance. In the ER, she is still complaining of 5/10 chest pain and SOB but does not appear to be in acute distress. Her oxygen saturation is 95% on room air. The chest pain is worse with ambulation and with deep inspiration. She also has more pain with palpation.   Past Medical History  Diagnosis Date  . CHF (congestive heart failure)   . Hypertension   . CVA (cerebral vascular accident)   . Hypothyroidism   . COPD (chronic obstructive pulmonary disease)   . Sick sinus syndrome     status post pacemaker implantation  . Paroxysmal atrial fibrillation   . Subendocardial myocardial infarction   . Subdural hematoma   . CAD (coronary artery disease)     post PTCA and stenting of the LAD and Status post maze procedure  . History of gastrointestinal bleeding   . GERD (gastroesophageal reflux disease)   . Osteoporosis     Surgical History:  Past Surgical History  Procedure Laterality Date  . Coronary artery bypass graft  January 2011    LIMA-LAD, SVG-RCA, SVG-PL, Cox-Maze  . Cardiac catheterization  08/17/2009    Est. EF is 35% to 40%  . Pacemaker insertion  September 2010    MDT implanted by Dr Reyes Ivan for SSS     I have reviewed the patient's current medications. Medication Sig  acetaminophen (TYLENOL) 500 MG tablet Take 1,000 mg by mouth every 6 (six) hours as needed for pain.  amiodarone (PACERONE) 200 MG tablet Take  200 mg by mouth every Monday, Wednesday, and Friday.  aspirin EC 81 MG tablet Take 81 mg by mouth every evening.   Biotin 1000 MCG tablet Take 1,000 mcg by mouth every evening.  Calcium Carbonate-Vitamin D (CALCIUM 600 + D PO) Take 1 tablet by mouth every morning.  ciprofloxacin (CIPRO) 250 MG tablet Take 1 tablet (250 mg total) by mouth 2 (two) times daily. X 3 days  clopidogrel (PLAVIX) 75 MG tablet Take 75 mg by mouth every morning.  escitalopram (LEXAPRO) 10 MG tablet Take 5 mg by mouth every morning.   febuxostat (ULORIC) 40 MG tablet Take 80 mg by mouth every morning.  furosemide (LASIX) 20 MG tablet Take 20 mg by mouth every morning.   levothyroxine (SYNTHROID, LEVOTHROID) 150 MCG tablet Take 150-225 mcg by mouth See admin instructions. Takes Sunday-Friday & (1.5 tab) on Sat  metoprolol succinate (TOPROL-XL) 25 MG 24 hr tablet Take 25 mg by mouth every morning.   Multiple Vitamin (MULTIVITAMIN WITH MINERALS) TABS Take 1 tablet by mouth every morning.   pantoprazole (PROTONIX) 40 MG tablet Take 40 mg by mouth every morning.   potassium chloride (K-DUR,KLOR-CON) 10 MEQ tablet Take 10 mEq by mouth every morning.   ranolazine (RANEXA) 500 MG 12 hr tablet Take 500 mg by mouth 2 (two) times daily.  vitamin C (ASCORBIC ACID) 500 MG tablet Take 500  mg by mouth every morning.   nitroGLYCERIN (NITROSTAT) 0.4 MG SL tablet Place 0.4 mg under the tongue every 5 (five) minutes as needed. For chest pain  Vitamin D, Ergocalciferol, (DRISDOL) 50000 UNITS CAPS Take 50,000 Units by mouth every 7 (seven) days. Takes on Tuesday   Scheduled Meds: Continuous Infusions: PRN Meds:.    Allergies:  Allergies  Allergen Reactions  . Codeine Itching and Rash  . Statins Itching and Rash  . Sulfa Antibiotics Itching and Rash    History   Social History  . Marital Status: Married    Spouse Name: N/A    Number of Children: N/A  . Years of Education: N/A   Occupational History  . Retired      Social History Main Topics  . Smoking status: Never Smoker   . Smokeless tobacco: Never Used  . Alcohol Use: No  . Drug Use: No  . Sexually Active: No   Other Topics Concern  . Not on file   Social History Narrative  . No narrative on file    Family History  Problem Relation Age of Onset  . Lung cancer Father   . Heart attack Mother   . Coronary artery disease Mother   . Coronary artery disease Brother     with CABG  . Heart attack Brother   . Heart attack Brother   . Lung cancer Brother   . Coronary artery disease Sister   . Hypertension Mother   . Hypertension Brother   . Hypertension Brother    Family Status  Relation Status Death Age  . Father Deceased 86    lung cancer  . Mother Deceased 56    MI   . Brother Deceased 74    MI  . Brother Deceased 73    MI ( with CAD had CABG)  . Brother Deceased 44    lung cancer  . Sister      with CAD had CABG    Review of Systems: Poor balance and fell 4/25, admitted till 4/27. Multiple MS complaints and has staples in the upper occipital area. Recent UTI, treated. Full 14-point review of systems otherwise negative except as noted above.  Physical Exam: Blood pressure 132/51, pulse 70, temperature 97.8 F (36.6 C), temperature source Oral, resp. rate 20, SpO2 95.00%. General: Well developed, well nourished, elderly female in no acute distress. Head: Normocephalic, atraumatic, sclera non-icteric, no xanthomas, nares are without discharge. Dentition: poor Neck: No carotid bruits. JVD not elevated. No thyromegally Lungs: Good expansion bilaterally. without wheezes or rhonchi. Bilateral rales Heart: Regular rate and rhythm with S1 S2.  No S3 or S4.  No murmur, no rubs, or gallops appreciated. Abdomen: Soft, non-tender, non-distended with normoactive bowel sounds. No hepatomegaly. No rebound/guarding. No obvious abdominal masses. Msk:  Strength and tone appear weak for age. No joint deformities or effusions, no spine or  costo-vertebral angle tenderness. Some anterior chest wall tenderness Extremities: No clubbing or cyanosis. No edema.  Distal pedal pulses are 2+ in 4 extrem Neuro: Alert and oriented X 3. Moves all extremities spontaneously. No focal deficits noted. Psych:  Responds to questions appropriately with a normal affect but memory is poor. Skin: No rashes or lesions noted  Labs:   Lab Results  Component Value Date   WBC 11.9* 10/29/2012   HGB 11.1* 10/29/2012   HCT 32.7* 10/29/2012   MCV 88.4 10/29/2012   PLT 286 10/29/2012   No results found for this basename: INR,  in the  last 72 hours   Recent Labs Lab 10/29/12 1400  NA 137  K 4.1  CL 99  CO2 27  BUN 25*  CREATININE 1.11*  CALCIUM 9.8  PROT 7.5  BILITOT 0.4  ALKPHOS 99  ALT 19  AST 24  GLUCOSE 86    Recent Labs  10/29/12 1400  TROPONINI <0.30   No results found for this basename: TROPIPOC,  in the last 72 hours  Pro B Natriuretic peptide (BNP)  Date/Time Value Range Status  10/29/2012  2:00 PM 415.5  0 - 450 pg/mL Final  07/12/2009  5:20 AM 1001.0* 0.0 - 100.0 pg/mL Final   Radiology/Studies: Dg Chest 2 View 10/29/2012  *RADIOLOGY REPORT*  Clinical Data: Chest pain, shortness of breath.  CHEST - 2 VIEW  Comparison: 10/26/2012  Findings: Left pacer is in place with leads in the right atrium and right ventricle.  Mild cardiomegaly.  Prior CABG.  Lungs are clear. No effusions.  No acute bony abnormality.  IMPRESSION: Cardiomegaly.  No acute cardiopulmonary disease.   Original Report Authenticated By: Charlett Nose, M.D.    Cardiac Cath: 2011 Pt had MI post-CABG, medical therapy recommended, see printout.  Echo:07/31/2012 Study Conclusions - Left ventricle: The cavity size was normal. Wall thickness was increased in a pattern of mild LVH. Systolic function was normal. The estimated ejection fraction was in the range of 60% to 65%. Wall motion was normal; there were no regional wall motion abnormalities. - Mitral valve:  Mild regurgitation. - Left atrium: The atrium was moderately dilated. - Right atrium: The atrium was mildly dilated. - Pulmonary arteries: PA peak pressure: 48mm Hg (S).  ECG: 25-Oct-2012 16:13:52  SINUS RHYTHM ~ normal P axis, V-rate 50- 99 EARLY PRECORDIAL R/S TRANSITION ~ QRS area positive in V2 NONSPECIFIC T ABNORMALITIES, ANTERIOR LEADS ~ T <-0.13mV, V2-V4 Vent. rate 70 BPM PR interval 202 ms QRS duration 98 ms QT/QTc 436/470 ms P-R-T axes 0 38 70  ASSESSMENT AND PLAN:  Active Problems:   * No active hospital problems. *   Signed, Theodore Demark, PA-C 10/29/2012 5:58 PM Beeper [phone]  Patient seen by PA, agree with the above note.  Patient has had chest pain, somewhat pleuritic and also reproduced by chest wall palpation (has bruise on central chest wall), since last night around 8 pm.  Cardiac enzymes negative, ECG is unchanged from prior.  She has also been short of breath with any exertion.  This is new also since last night.  Prior history includes paroxysmal atrial fibrillation (not on anticoagulation because of falls), recent admit because of fall, CHF s/p CABG, and PCM.   CXR does not show pulmonary edema or PNA.  On exam, she is not significantly volume overloaded.  Her BNP is not significantly elevated.  1. Dyspnea: On exam, she does not appear particularly volume overloaded.  CXR looks ok.  She does not have PNA. She is fairly sedentary.  I think that we need to rule out PE.  - I will get a D dimer, if positive she will need CT angiogram for PE and will start on heparin gtt.  2. Chest pain: Prolonged CP, also tender chest wall.  First set of cardiac enzymes negative.  Cycle cardiac enzymes.  As above, getting D dimer to assess for PE.  Does not sound ischemic but will follow closely.  Continue home cardiac meds.   3. Atrial fibrillation: Paroxysmal.  Not anticoagulated because of frequent falls.   Marca Ancona 10/29/2012 6:24 PM

## 2012-10-29 NOTE — ED Notes (Signed)
Pt here with CP worse with movement and inspiration; pt fell recently and has staples in head and bruise to chest from c collar; per family SOB new and change from base line; pt clammy at present

## 2012-10-29 NOTE — ED Notes (Signed)
Heart healthy dinner tray ordered for patient. Pt to be admitted.

## 2012-10-29 NOTE — Progress Notes (Signed)

## 2012-10-29 NOTE — ED Notes (Signed)
Pt has staples to back of head from previous fall, has bruise to chest from c-collar.

## 2012-10-30 DIAGNOSIS — R079 Chest pain, unspecified: Secondary | ICD-10-CM

## 2012-10-30 LAB — COMPREHENSIVE METABOLIC PANEL
ALT: 14 U/L (ref 0–35)
AST: 18 U/L (ref 0–37)
Alkaline Phosphatase: 87 U/L (ref 39–117)
CO2: 24 mEq/L (ref 19–32)
Chloride: 103 mEq/L (ref 96–112)
GFR calc Af Amer: 47 mL/min — ABNORMAL LOW (ref >90)
GFR calc non Af Amer: 40 mL/min — ABNORMAL LOW (ref >90)
Glucose, Bld: 112 mg/dL — ABNORMAL HIGH (ref 70–99)
Potassium: 4 mEq/L (ref 3.5–5.1)
Sodium: 137 mEq/L (ref 135–145)
Total Bilirubin: 0.4 mg/dL (ref 0.3–1.2)

## 2012-10-30 LAB — TROPONIN I
Troponin I: <0.3 ng/mL (ref <0.30)
Troponin I: <0.3 ng/mL (ref <0.30)

## 2012-10-30 LAB — CBC
Hemoglobin: 9.6 g/dL — ABNORMAL LOW (ref 12.0–15.0)
MCH: 29.5 pg (ref 26.0–34.0)
Platelets: 272 10*3/uL (ref 150–400)
RBC: 3.25 MIL/uL — ABNORMAL LOW (ref 3.87–5.11)
WBC: 11.2 10*3/uL — ABNORMAL HIGH (ref 4.0–10.5)

## 2012-10-30 LAB — HEPARIN LEVEL (UNFRACTIONATED): Heparin Unfractionated: 0.52 IU/mL (ref 0.30–0.70)

## 2012-10-30 MED ORDER — LORAZEPAM 2 MG/ML IJ SOLN
1.0000 mg | Freq: Once | INTRAMUSCULAR | Status: AC
  Administered 2012-10-31: 1 mg via INTRAVENOUS
  Filled 2012-10-30 (×2): qty 1

## 2012-10-30 NOTE — Progress Notes (Signed)
PCP Courtesy Visit. Events reviewed.   Appreciate cards eval. She has a new T3 fracture noted on CT and 4/25 xray.  She has 8-9 vertebral fractures in all.  We have treated her maximally for osteoporosis and her conditions.   I think her pain this time is from the subacute T3 fracture. I have spoken to [facility] and Tammy who are her caregivers.  They want to take her home. I think that is okay provided the patient can transfer to the chair with assist in the a.m. . I suggested transfer to SNF again but the family doesn't want this and they are very supportive. We will see her in the office next Monday to have her scalp staples removed and follow-up.  Please call if any questions  [phone]   Rodrigo Ran

## 2012-10-30 NOTE — Progress Notes (Signed)
Utilization Review Completed Jalicia Roszak J. Quartez Lagos, RN, BSN, NCM [phone]  

## 2012-10-30 NOTE — H&P (Signed)
Cardiology H&P   Patient ID: [name] MRN: [id number], DOB/AGE: 77-Oct-1931 77 y.o. Date of Encounter: 10/30/2012  Primary Physician: Ezequiel Kayser, MD Primary Cardiologist: Nahser  Chief Complaint:  Chest pain and SOB  HPI: [name] is a 79 y.o. female with with history of CAD and CHF. Pt fell 4/25 and was admitted till 4/27. She was doing well at home and then had sudden onset of SSCP and SOB. After d/c she was ambulating to the bathroom and in the home before then. Once she developed chest pain and SOB, she was unable to ambulate without assistance. In the ER, she is still complaining of 5/10 chest pain and SOB but does not appear to be in acute distress. Her oxygen saturation is 95% on room air. The chest pain is worse with ambulation and with deep inspiration. She also has more pain with palpation.  She remains tender to palpation.     Past Medical History  Diagnosis Date  . CHF (congestive heart failure)   . Hypertension   . CVA (cerebral vascular accident)   . Hypothyroidism   . Sick sinus syndrome     status post pacemaker implantation  . Paroxysmal atrial fibrillation   . Subendocardial myocardial infarction   . Subdural hematoma   . CAD (coronary artery disease)     post PTCA and stenting of the LAD and Status post maze procedure  . History of gastrointestinal bleeding   . GERD (gastroesophageal reflux disease)   . Osteoporosis     Surgical History:  Past Surgical History  Procedure Laterality Date  . Coronary artery bypass graft  January 2011    LIMA-LAD, SVG-RCA, SVG-PL, Cox-Maze  . Cardiac catheterization  08/17/2009    Est. EF is 35% to 40%  . Pacemaker insertion  September 2010    MDT implanted by Dr Reyes Ivan for SSS  . Abdominal hysterectomy    . Insert / replace / remove pacemaker    . Joint replacement      right knee  . Fracture surgery      left wrist     I have reviewed the patient's current medications. Medication Sig    acetaminophen (TYLENOL) 500 MG tablet Take 1,000 mg by mouth every 6 (six) hours as needed for pain.  amiodarone (PACERONE) 200 MG tablet Take 200 mg by mouth every Monday, Wednesday, and Friday.  aspirin EC 81 MG tablet Take 81 mg by mouth every evening.   Biotin 1000 MCG tablet Take 1,000 mcg by mouth every evening.  Calcium Carbonate-Vitamin D (CALCIUM 600 + D PO) Take 1 tablet by mouth every morning.  ciprofloxacin (CIPRO) 250 MG tablet Take 1 tablet (250 mg total) by mouth 2 (two) times daily. X 3 days  clopidogrel (PLAVIX) 75 MG tablet Take 75 mg by mouth every morning.  escitalopram (LEXAPRO) 10 MG tablet Take 5 mg by mouth every morning.   febuxostat (ULORIC) 40 MG tablet Take 80 mg by mouth every morning.  furosemide (LASIX) 20 MG tablet Take 20 mg by mouth every morning.   levothyroxine (SYNTHROID, LEVOTHROID) 150 MCG tablet Take 150-225 mcg by mouth See admin instructions. Takes Sunday-Friday & (1.5 tab) on Sat  metoprolol succinate (TOPROL-XL) 25 MG 24 hr tablet Take 25 mg by mouth every morning.   Multiple Vitamin (MULTIVITAMIN WITH MINERALS) TABS Take 1 tablet by mouth every morning.   pantoprazole (PROTONIX) 40 MG tablet Take 40 mg by mouth every morning.   potassium chloride (  K-DUR,KLOR-CON) 10 MEQ tablet Take 10 mEq by mouth every morning.   ranolazine (RANEXA) 500 MG 12 hr tablet Take 500 mg by mouth 2 (two) times daily.  vitamin C (ASCORBIC ACID) 500 MG tablet Take 500 mg by mouth every morning.   nitroGLYCERIN (NITROSTAT) 0.4 MG SL tablet Place 0.4 mg under the tongue every 5 (five) minutes as needed. For chest pain  Vitamin D, Ergocalciferol, (DRISDOL) 50000 UNITS CAPS Take 50,000 Units by mouth every 7 (seven) days. Takes on Tuesday   Scheduled Meds: Continuous Infusions: PRN Meds:.sodium chloride, acetaminophen, ALPRAZolam, nitroGLYCERIN, ondansetron (ZOFRAN) IV, sodium chloride, zolpidem  Allergies:  Allergies  Allergen Reactions  . Codeine Itching  and Rash  . Statins Itching and Rash  . Sulfa Antibiotics Itching and Rash    History   Social History  . Marital Status: Married    Spouse Name: N/A    Number of Children: N/A  . Years of Education: N/A   Occupational History  . Retired    Social History Main Topics  . Smoking status: Never Smoker   . Smokeless tobacco: Never Used  . Alcohol Use: No  . Drug Use: No  . Sexually Active: No   Other Topics Concern  . Not on file   Social History Narrative  . No narrative on file    Family History  Problem Relation Age of Onset  . Lung cancer Father   . Heart attack Mother   . Coronary artery disease Mother   . Coronary artery disease Brother     with CABG  . Heart attack Brother   . Heart attack Brother   . Lung cancer Brother   . Coronary artery disease Sister   . Hypertension Mother   . Hypertension Brother   . Hypertension Brother    Family Status  Relation Status Death Age  . Father Deceased 51    lung cancer  . Mother Deceased 82    MI   . Brother Deceased 38    MI  . Brother Deceased 55    MI ( with CAD had CABG)  . Brother Deceased 70    lung cancer  . Sister      with CAD had CABG    Review of Systems: Poor balance and fell 4/25, admitted till 4/27. Multiple MS complaints and has staples in the upper occipital area. Recent UTI, treated. Full 14-point review of systems otherwise negative except as noted above.  Physical Exam: Blood pressure 133/53, pulse 70, temperature 98.1 F (36.7 C), temperature source Oral, resp. rate 18, height 5\' 6"  (1.676 m), weight 214 lb 4.6 oz (97.2 kg), SpO2 96.00%. General: elderly female, NAD,  Head: Normocephalic, atraumatic, sclera non-icteric, no xanthomas, nares are without discharge. Dentition: poor Neck: No carotid bruits. JVD not elevated. No thyromegally Lungs: Good expansion bilaterally. without wheezes or rhonchi. Chest wall is tender Heart: Regular rate and rhythm with S1 S2.  No S3 or S4.  No murmur,  no rubs, or gallops appreciated. Abdomen: Soft, non-tender, non-distended with normoactive bowel sounds. No hepatomegaly. No rebound/guarding. No obvious abdominal masses. Msk:  Strength and tone appear weak for age. No joint deformities or effusions, no spine or costo-vertebral angle tenderness. Some anterior chest wall tenderness Extremities: No clubbing or cyanosis. No edema.  Distal pedal pulses are 2+ in 4 extrem Neuro: Alert and oriented X 3. Moves all extremities spontaneously. No focal deficits noted. Psych:  Responds to questions appropriately with a normal affect but memory is  poor. Skin: No rashes or lesions noted  Labs:   Lab Results  Component Value Date   WBC 11.2* 10/30/2012   HGB 9.6* 10/30/2012   HCT 28.7* 10/30/2012   MCV 88.3 10/30/2012   PLT 272 10/30/2012   No results found for this basename: INR,  in the last 72 hours   Recent Labs Lab 10/30/12 0453  NA 137  K 4.0  CL 103  CO2 24  BUN 24*  CREATININE 1.22*  CALCIUM 8.7  PROT 6.3  BILITOT 0.4  ALKPHOS 87  ALT 14  AST 18  GLUCOSE 112*    Recent Labs  10/29/12 1400 10/29/12 2354 10/30/12 0453  TROPONINI <0.30 <0.30 <0.30   No results found for this basename: TROPIPOC,  in the last 72 hours  Pro B Natriuretic peptide (BNP)  Date/Time Value Range Status  10/29/2012  2:00 PM 415.5  0 - 450 pg/mL Final  07/12/2009  5:20 AM 1001.0* 0.0 - 100.0 pg/mL Final   Radiology/Studies: Dg Chest 2 View 10/29/2012  *RADIOLOGY REPORT*  Clinical Data: Chest pain, shortness of breath.  CHEST - 2 VIEW  Comparison: 10/26/2012  Findings: Left pacer is in place with leads in the right atrium and right ventricle.  Mild cardiomegaly.  Prior CABG.  Lungs are clear. No effusions.  No acute bony abnormality.  IMPRESSION: Cardiomegaly.  No acute cardiopulmonary disease.   Original Report Authenticated By: Charlett Nose, M.D.    Cardiac Cath: 2011 Pt had MI post-CABG, medical therapy recommended, see  printout.  Echo:07/31/2012 Study Conclusions - Left ventricle: The cavity size was normal. Wall thickness was increased in a pattern of mild LVH. Systolic function was normal. The estimated ejection fraction was in the range of 60% to 65%. Wall motion was normal; there were no regional wall motion abnormalities. - Mitral valve: Mild regurgitation. - Left atrium: The atrium was moderately dilated. - Right atrium: The atrium was mildly dilated. - Pulmonary arteries: PA peak pressure: 48mm Hg (S).  ECG: 25-Oct-2012 16:13:52  SINUS RHYTHM ~ normal P axis, V-rate 50- 99 EARLY PRECORDIAL R/S TRANSITION ~ QRS area positive in V2 NONSPECIFIC T ABNORMALITIES, ANTERIOR LEADS ~ T <-0.34mV, V2-V4 Vent. rate 70 BPM PR interval 202 ms QRS duration 98 ms QT/QTc 436/470 ms P-R-T axes 0 38 70  ASSESSMENT AND PLAN:  Principal Problem:   Precordial pain Active Problems:   Acute blood loss anemia   DOE (dyspnea on exertion)   Acute back pain less than 4 weeks duration  1. Non-cardiac chest pain: the patient has ruled out for MI.  CTA of the lungs was negative for PE.  This pain is more c/w chest wall pain pain - tender to palpitation.  Her overall condition continues to decline.  She is not a candidate for any invasive cardiology procedures - fortunately, no further invasive evaluation is indicated at this time.  She is falling frequently.  She has had several visits to the ER.  She spent some time at a nursing home but now is back home.  She has a family member who is trying to care for her.  At this point, I think we we need to consider long term SNF placement.  I have discussed with Dr. [name] who agrees.    Will begin the placement process.   2. CVA:  3. COPD  4. Subdural hematoma   Alvia Grove., MD, [facility] 10/30/2012, 7:49 AM Office - [phone] Pager [phone]]

## 2012-10-30 NOTE — Clinical Social Work Note (Signed)
CSW received a consult for pt by [facility], Durward Mallard.  Per Care Management consult, pt is need of SNF placement.  Per chart review and discussion with RNCM, no consult for PT/OT has been placed.  Please place PT/OT consult for possible placement.   Pt has Blue Medicare and a pre-authorization is necessary prior to d/c.  CSW will f/u with pt and family re: placement.  (Full assessment to follow). Please contact CSW will projected d/c date.  Vickii Penna, LCSWA [phone]  Clinical Social Work

## 2012-10-30 NOTE — Progress Notes (Signed)
ANTICOAGULATION CONSULT NOTE - Follow Up Consult  Pharmacy Consult for heparin Indication: r/o PE  Labs:  Recent Labs  10/27/12 0600 10/29/12 1400 10/29/12 2354 10/30/12 0458  HGB 10.0* 11.1*  --  9.6*  HCT 29.2* 32.7*  --  28.7*  PLT 211 286  --  272  HEPARINUNFRC  --   --   --  0.52  CREATININE 1.15* 1.11*  --   --   TROPONINI  --  <0.30 <0.30  --     Assessment/Plan:  79yo female therapeutic on heparin with initial dosing for possible PE.  Will continue gtt at current rate and confirm stable with additional level.  Vernard Gambles, PharmD, BCPS  10/30/2012,5:42 AM

## 2012-10-30 NOTE — Progress Notes (Signed)
Pt bed alarm went off. Pt sitting on side of bed asking for help. Pt states that she cannot reach her husband. Pt anxious and agitated trying to get out of bed "to go to the kitchen". Pt has PRN Xanax for anxiety. Pt re-oriented to room, hospital, and call bell. Pt refused scheduled and PRN medicine this PM. Lawanna Kobus, pt niece called and stated that pt gets confused, agitated and anxious at times, worse in the hospital. MD on call notified and order for safety sitter overnight due to pt's history of multiple falls and agitation, as well as 1mg  IV ativan one time dose. Pt niece arrived to floor and pt took her scheduled medicine, but not PRN medicine. Pt still anxious/agitated with niece in the room. When RN brought Ativan to room,  pt was asleep. Will continue to monitor pt status. Safety sitter to sit in room from 11P-7A this shift. Will re-assess need for sitter in AM Baron Hamper, [place] 10/30/2012 10:44 PM

## 2012-10-31 ENCOUNTER — Ambulatory Visit (INDEPENDENT_AMBULATORY_CARE_PROVIDER_SITE_OTHER): Payer: Medicare Other | Admitting: *Deleted

## 2012-10-31 DIAGNOSIS — I495 Sick sinus syndrome: Secondary | ICD-10-CM

## 2012-10-31 DIAGNOSIS — I4891 Unspecified atrial fibrillation: Secondary | ICD-10-CM

## 2012-10-31 DIAGNOSIS — M549 Dorsalgia, unspecified: Secondary | ICD-10-CM

## 2012-10-31 LAB — CBC
Hemoglobin: 10.9 g/dL — ABNORMAL LOW (ref 12.0–15.0)
MCH: 29.7 pg (ref 26.0–34.0)
MCHC: 34.1 g/dL (ref 30.0–36.0)
RDW: 15.9 % — ABNORMAL HIGH (ref 11.5–15.5)

## 2012-10-31 MED ORDER — LORAZEPAM 0.5 MG PO TABS: 0.2500 mg | ORAL_TABLET | Freq: Three times a day (TID) | ORAL | Status: DC

## 2012-10-31 NOTE — Discharge Summary (Signed)
CARDIOLOGY DISCHARGE SUMMARY   Patient ID: [name] MRN: [id number] DOB/AGE: 01/13/1930 79 y.o.  Admit date: 10/29/2012 Discharge date: 10/31/2012  Primary Discharge Diagnosis:    Precordial pain  Secondary Discharge Diagnosis:    Acute blood loss anemia    DOE (dyspnea on exertion)   Acute back pain less than 4 weeks duration   Possible dementia with agitation   Consults: Dr. [name], occupational therapy, Care management, clinical social work  Procedures:  CT angiogram of the chest  Hospital Course: [name] is a 79 y.o. female with a history of CAD. She had been discharged home on 10/27/2012 after being admitted after a fall. She also had a urinary tract infection. On 10/29/2012, she had chest pain and shortness of breath. She was unable to ambulate without assistance. Her chest pain continued and she came to the emergency room. The pain was worse with ambulation and with deep inspiration as well as palpation. She was admitted for further evaluation and treatment.  Her chest x-ray did not show any new abnormality. Her d-dimer was checked and was elevated so she had a CT angiogram of the chest. It showed thoracic vertebral compression fractures but no sternal or rib fractures. Of note, her falls have been occurring because of balance problems. She will lose balance and fall backwards. This is a chronic problem and she has been evaluated for this before.  Dr. [name] reviewed her x-rays. He noted that she had a new T3 fracture as well as a total of 8 or 9 vertebral fractures in all. He felt her pain was from the subacute T3 fracture. Dr. [name] discussed the situation with the patient's daughters who are her caregivers. They believe they can provide adequate care at home. Dr. [name] will follow her as an outpatient and will remove the staples used to treat her scalp laceration from the last fall.   On 10/31/2012, Ms. Clare Gandy was seen by Dr. Elease Hashimoto. She was seen by occupational  therapy but there were no acute needs as long as the patient has 24/7 care at home. Her pacemaker was interrogated and she will follow up with Dr. Johney Frame as an outpatient for further management.   She has had problems with probable dementia plus agitation and received Ativan 1 mg IV for this. The family requested a prescription for Ativan. They were advised the patient would be at higher risk of falling on this medication and agreed she would have to be watched carefully if it were used. They requested a few pills to use as a last resort if her agitation could not otherwise be controlled and this was done.   There were no other inpatient needs identified. Once skilled nursing facility placement was refused, the patient was otherwise ready for discharge home in the care of her family.    Labs:  Lab Results  Component Value Date   WBC 8.1 10/31/2012   HGB 10.9* 10/31/2012   HCT 32.0* 10/31/2012   MCV 87.2 10/31/2012   PLT 301 10/31/2012     Recent Labs Lab 10/30/12 0453  NA 137  K 4.0  CL 103  CO2 24  BUN 24*  CREATININE 1.22*  CALCIUM 8.7  PROT 6.3  BILITOT 0.4  ALKPHOS 87  ALT 14  AST 18  GLUCOSE 112*    Recent Labs  10/29/12 2354 10/30/12 0453 10/30/12 1108  TROPONINI <0.30 <0.30 <0.30    Pro B Natriuretic peptide (BNP)  Date/Time Value Range Status  10/29/2012  2:00 PM 415.5  0 - 450 pg/mL Final  07/12/2009  5:20 AM 1001.0* 0.0 - 100.0 pg/mL Final   Lab Results  Component Value Date   DDIMER 1.50* 10/29/2012   Radiology: Dg Chest 2 View 10/29/2012  *RADIOLOGY REPORT*  Clinical Data: Chest pain, shortness of breath.  CHEST - 2 VIEW  Comparison: 10/26/2012  Findings: Left pacer is in place with leads in the right atrium and right ventricle.  Mild cardiomegaly.  Prior CABG.  Lungs are clear. No effusions.  No acute bony abnormality.  IMPRESSION: Cardiomegaly.  No acute cardiopulmonary disease.   Original Report Authenticated By: Charlett Nose, M.D.    Ct Angio Chest Pe W/cm  &/or Wo Cm 10/29/2012  *RADIOLOGY REPORT*  Clinical Data: Shortness of breath.  Chest pain.  Elevated D-dimer. Recent fall.  CT ANGIOGRAPHY CHEST  Technique:  Multidetector CT imaging of the chest using the standard protocol during bolus administration of intravenous contrast. Multiplanar reconstructed images including MIPs were obtained and reviewed to evaluate the vascular anatomy.  Contrast: OMNIPAQUE IOHEXOL 350 MG/ML SOLN  Comparison: 06/21/2011  Findings: Technically adequate study with good opacification of the central and segmental pulmonary arteries.  No focal filling defects are demonstrated.  No evidence of significant pulmonary embolus.  Postoperative changes in the mediastinum.  Diffuse cardiac enlargement.  Coronary artery and aortic calcifications.  No aortic aneurysm.  Mediastinal lymph nodes are not pathologically enlarged. Moderate sized esophageal hiatal hernia.  Visualized portions of the upper abdominal organs are grossly unremarkable.  Cardiac pacemaker.  No pleural effusions.  Visualization of the lungs is limited due to motion artifact but there appears to be atelectasis in the lung bases.  No definite focal consolidation.  No pneumothorax.  Airways appear patent.  Vague nodularity focally in the anterior right middle lobe inferiorly.  Largest discrete nodule measures about 4 mm diameter.  Tiny nodules are also demonstrated in the right costophrenic angle.  These appear stable since previous study. Degenerative changes of the thoracic spine.  Stable compression of mid thoracic vertebrae.  Compression fracture of the upper thoracic vertebra, likely representing T3.  This is new since the previous CT scan but was present on a thoracic spine series from 10/25/2012.  IMPRESSION: No evidence of significant pulmonary embolus.  Atelectasis in the lungs.  Stable pulmonary nodules.  Thoracic vertebral compression fractures without acute change.   Original Report Authenticated By: Burman Nieves, M.D.    EKG:  25-Oct-2012 16:13:52  SINUS RHYTHM ~ normal P axis, V-rate 50- 99 EARLY PRECORDIAL R/S TRANSITION ~ QRS area positive in V2 NONSPECIFIC T ABNORMALITIES, ANTERIOR LEADS ~ T <-0.82mV, V2-V4 Vent. rate 70 BPM PR interval 202 ms QRS duration 98 ms QT/QTc 436/470 ms P-R-T axes 0 38 70  FOLLOW UP PLANS AND APPOINTMENTS Allergies  Allergen Reactions  . Codeine Itching and Rash  . Statins Itching and Rash  . Sulfa Antibiotics Itching and Rash     Medication List    STOP taking these medications       ciprofloxacin 250 MG tablet  Commonly known as:  CIPRO      TAKE these medications       acetaminophen 500 MG tablet  Commonly known as:  TYLENOL  Take 1,000 mg by mouth every 6 (six) hours as needed for pain.     amiodarone 200 MG tablet  Commonly known as:  PACERONE  Take 200 mg by mouth every Monday, Wednesday, and Friday.     aspirin EC  81 MG tablet  Take 81 mg by mouth every evening.     Biotin 1000 MCG tablet  Take 1,000 mcg by mouth every evening.     CALCIUM 600 + D PO  Take 1 tablet by mouth every morning.     clopidogrel 75 MG tablet  Commonly known as:  PLAVIX  Take 75 mg by mouth every morning.     febuxostat 40 MG tablet  Commonly known as:  ULORIC  Take 80 mg by mouth every morning.     furosemide 20 MG tablet  Commonly known as:  LASIX  Take 20 mg by mouth every morning.     levothyroxine 150 MCG tablet  Commonly known as:  SYNTHROID, LEVOTHROID  Take 150-225 mcg by mouth See admin instructions. Takes Sunday-Friday & (1.5 tab) on Sat     LEXAPRO 10 MG tablet  Generic drug:  escitalopram  Take 5 mg by mouth every morning.     LORazepam 0.5 MG tablet  Commonly known as:  ATIVAN  Take 0.5-1 tablets (0.25-0.5 mg total) by mouth every 8 (eight) hours.     metoprolol succinate 25 MG 24 hr tablet  Commonly known as:  TOPROL-XL  Take 25 mg by mouth every morning.     multivitamin with minerals Tabs  Take 1  tablet by mouth every morning.     nitroGLYCERIN 0.4 MG SL tablet  Commonly known as:  NITROSTAT  Place 0.4 mg under the tongue every 5 (five) minutes as needed. For chest pain     pantoprazole 40 MG tablet  Commonly known as:  PROTONIX  Take 40 mg by mouth every morning.     potassium chloride 10 MEQ tablet  Commonly known as:  K-DUR,KLOR-CON  Take 10 mEq by mouth every morning.     ranolazine 500 MG 12 hr tablet  Commonly known as:  RANEXA  Take 500 mg by mouth 2 (two) times daily.     vitamin C 500 MG tablet  Commonly known as:  ASCORBIC ACID  Take 500 mg by mouth every morning.     Vitamin D (Ergocalciferol) 50000 UNITS Caps  Commonly known as:  DRISDOL  Take 50,000 Units by mouth every 7 (seven) days. Takes on Tuesday         Future Appointments Provider Department Dept Phone   11/18/2012 11:10 AM Beatrice Lecher, PA-C [place] [facility] Main Office [place]) [phone]     Follow-up Information   Follow up with Tereso Newcomer, PA-C On 11/18/2012. (See for Dr Elease Hashimoto at 11:10 am)    Contact information:   [address] [place] [address] [phone]       BRING ALL MEDICATIONS WITH YOU TO FOLLOW UP APPOINTMENTS  Time spent with patient to include physician time: 41 min Signed: Theodore Demark, PA-C 10/31/2012, 6:07 PM Co-Sign MD  Attending Note:   The patient was seen and examined.  Agree with assessment and plan as noted above.  Changes made to the above note as needed.  See my note from earlier today.  The patient has been stable from a cardiac standpoint. Her chest pain was noncardiac. It turns out that she has a new compression fracture in her spine. She has been falling very frequently and has multiple compression fractures in her spine.  I strongly recommended that she go to a skilled nursing facility. Her family is adamant that she return home. I discussed this with Dr. Sherrye Payor. We both agree that  she is at risk of having further  falls.  [name] had a subdural hematoma and has been declining in health. At this point I do not think that she is a candidate for invasive or interventional cardiac procedures.  Vesta Mixer, Montez Hageman., MD, [facility] 10/31/2012, 6:21 PM

## 2012-10-31 NOTE — Care Management Note (Unsigned)
    Page 1 of 1   10/31/2012     1:34:25 PM   CARE MANAGEMENT NOTE 10/31/2012  Patient:  [name], [name]   Account Number:  [id number]  Date Initiated:  10/31/2012  Documentation initiated by:  [facility]  Subjective/Objective Assessment:   79 y.o. female with with history of CAD and CHF. Pt fell 4/25 and was admitted till 4/27. She was doing well at home and then had sudden onset of SSCP and SOB.     Action/Plan:   home with family/ SNF vs. Home Health   Anticipated DC Date:  10/31/2012   Anticipated DC Plan:  HOME/SELF CARE      DC Planning Services  CM consult      PAC Choice  NA   Choice offered to / List presented to:  C-4 Adult Children        HH arranged  HH - 11 Patient Refused      Status of service:  Completed, signed off Medicare Important Message given?   (If response is "NO", the following Medicare IM given date fields will be blank) Date Medicare IM given:   Date Additional Medicare IM given:    Discharge Disposition:    Per UR Regulation:    If discussed at Long Length of Stay Meetings, dates discussed:    Comments:  10/31/12 @ 1315.Marland KitchenMarland KitchenOletta Cohn, RN, BSN, [employer] [phone] Spoke with pt  and family regarding discharge planning. Offered [facility] list.  Pt family refused services at this time.  States they are knowledgeable of how to care for pt and will continue to use the PT exercises and monitor her meds.  States that they will contact PCP if/when Home Health is needed in the future.

## 2012-10-31 NOTE — Progress Notes (Signed)
PT Cancellation Note  Patient Details Name: ANYSHA FRAPPIER MRN: [id number] DOB: 1930/01/15   Cancelled Treatment:    Reason Eval/Treat Not Completed: Other (comment) (Pt preparing to d/c to home. family declining PT. )   Juliano Mceachin 10/31/2012, 6:03 PM Yavier Snider L. Kato Wieczorek DPT [phone]

## 2012-10-31 NOTE — Evaluation (Signed)
Occupational Therapy Evaluation and Discharge Patient Details Name: [name] MRN: [id number] DOB: 08/03/1929 Today's Date: 10/31/2012 Time: 0927-0950 OT Time Calculation (min): 23 min  OT Assessment / Plan / Recommendation Clinical Impression  This  79 yo female admitted with CP and SOB (and recently for a fall with hematoma and posterior head laceration) presents to acute OT at her baseline from an ADL standpoint. Per family in room, pt has 24/7 care at home. No OT needs identified, acute OT will sign off.    OT Assessment  Patient does not need any further OT services    Follow Up Recommendations  No OT follow up    Barriers to Discharge None    Equipment Recommendations  None recommended by OT          Precautions / Restrictions Precautions Precautions: Fall Precaution Comments: pt tends to fall bkwds, has fallen multiple times--currently had staples in the back of her head from when she fell last time (per family in room she is to have them out on Monday) Restrictions Weight Bearing Restrictions: No Other Position/Activity Restrictions: decreased vision out of right eye   Pertinent Vitals/Pain Back pain increased after getting up from bed; RN made aware and brought pain meds in to give pt while I was in the room    ADL  Transfers/Ambulation Related to ADLs: Initally Mod A sit to stand and stand to sit due to posterior lean; had pt really focus on "nose over toes for second time sit to stand and stand to sit---pt was Min A. Explained this to family in the room. Will have to say that "nose over toes" did aggravate pt's back pain. ADL Comments: Per family in room they pretty much do all of pt's BADLs including toileting clothing and hygiene.         Visit Information  Last OT Received On: 10/31/12 Assistance Needed: +1       Prior Functioning     Home Living Lives With: Spouse Available Help at Discharge: Family;Available 24 hours/day (other family than spouse  to A as well) Type of Home: House Home Access: Level entry Home Layout: One level Bathroom Shower/Tub: Engineer, manufacturing systems: Standard Bathroom Accessibility: Yes How Accessible: Accessible via walker Home Adaptive Equipment: Shower chair with back;Walker - rolling;Bedside commode/3-in-1;Hospital bed (gait belt) Additional Comments: good family support, they want to take her home and plan to provide 24 hr assist and even purchased a chair alarm the last time she was here Prior Function Level of Independence: Needs assistance Needs Assistance: Bathing;Dressing;Grooming;Toileting;Meal Prep;Light Housekeeping;Gait;Transfers Bath: Total Dressing: Total Grooming: Moderate Toileting: Maximal Meal Prep: Total Light Housekeeping: Total Gait Assistance: min A Transfer Assistance: Mod A with lower surfaces Able to Take Stairs?: No Driving: No Vocation: Retired Musician: No difficulties Dominant Hand: Right         Vision/Perception Vision - History Baseline Vision: Wears glasses all the time (decreased vision in right eye) Patient Visual Report: No change from baseline   Cognition  Cognition Arousal/Alertness: Awake/alert Behavior During Therapy: WFL for tasks assessed/performed Overall Cognitive Status: History of cognitive impairments - at baseline    Extremity/Trunk Assessment Right Upper Extremity Assessment RUE ROM/Strength/Tone: Deficits RUE ROM/Strength/Tone Deficits: generalized weakness Left Upper Extremity Assessment LUE ROM/Strength/Tone: Deficits LUE ROM/Strength/Tone Deficits: generalized weakness     Mobility Bed Mobility Bed Mobility: Rolling Left;Left Sidelying to Sit;Sitting - Scoot to Edge of Bed Rolling Left: 4: Min guard;With rail Left Sidelying to Sit: 4: Min assist;HOB flat;With  rails Sitting - Scoot to Edge of Bed: 4: Min guard Transfers Transfers: Sit to Stand;Stand to Sit Details for Transfer Assistance: First  attempt sit to stand and stand to sit from/to bed pt Mod A due to posterior lean, second attempt with cues for "nose over toes" pt min A sit to stand and stand to sit from/to bed           End of Session OT - End of Session Equipment Utilized During Treatment: Gait belt Activity Tolerance: Patient tolerated treatment well Patient left: in chair;with call bell/phone within reach;with family/visitor present Nurse Communication:  (Sitter in room, got her a gait belt to use with pt)  GO Functional Assessment Tool Used: Clinical observation Functional Limitation: Self care Self Care Current Status (W0981): At least 80 percent but less than 100 percent impaired, limited or restricted Self Care Goal Status (X9147): At least 80 percent but less than 100 percent impaired, limited or restricted Self Care Discharge Status [phone]): At least 80 percent but less than 100 percent impaired, limited or restricted   Evette Georges [phone] 10/31/2012, 10:37 AM

## 2012-10-31 NOTE — Progress Notes (Signed)
Cardiology H&P   Patient ID: CLARISSA Mendez MRN: [id number], DOB/AGE: [id number] 77 y.o. Date of Encounter: 10/31/2012  Primary Physician: Ezequiel Kayser, MD Primary Cardiologist: Nahser  Chief Complaint:  Chest pain and SOB  HPI: [name] is a 79 y.o. female with with history of CAD and CHF. Pt fell 4/25 and was admitted till 4/27. She was doing well at home and then had sudden onset of SSCP and SOB. After d/c she was ambulating to the bathroom and in the home before then. Once she developed chest pain and SOB, she was unable to ambulate without assistance. In the ER, she is still complaining of 5/10 chest pain and SOB but does not appear to be in acute distress. Her oxygen saturation is 95% on room air. The chest pain is worse with ambulation and with deep inspiration. She also has more pain with palpation.  She is asleep this am.  I appreciate Dr. Laurey Morale input.  I also think that she should go to SNF.  She has fallen multiple times at home and now has multiple compression fractures in her back.     Past Medical History  Diagnosis Date  . CHF (congestive heart failure)   . Hypertension   . CVA (cerebral vascular accident)   . Hypothyroidism   . Sick sinus syndrome     status post pacemaker implantation  . Paroxysmal atrial fibrillation   . Subendocardial myocardial infarction   . Subdural hematoma   . CAD (coronary artery disease)     post PTCA and stenting of the LAD and Status post maze procedure  . History of gastrointestinal bleeding   . GERD (gastroesophageal reflux disease)   . Osteoporosis     Surgical History:  Past Surgical History  Procedure Laterality Date  . Coronary artery bypass graft  January 2011    LIMA-LAD, SVG-RCA, SVG-PL, Cox-Maze  . Cardiac catheterization  08/17/2009    Est. EF is 35% to 40%  . Pacemaker insertion  September 2010    MDT implanted by Dr Reyes Ivan for SSS  . Abdominal hysterectomy    . Insert / replace / remove pacemaker    .  Joint replacement      right knee  . Fracture surgery      left wrist     I have reviewed the patient's current medications. Medication Sig  acetaminophen (TYLENOL) 500 MG tablet Take 1,000 mg by mouth every 6 (six) hours as needed for pain.  amiodarone (PACERONE) 200 MG tablet Take 200 mg by mouth every Monday, Wednesday, and Friday.  aspirin EC 81 MG tablet Take 81 mg by mouth every evening.   Biotin 1000 MCG tablet Take 1,000 mcg by mouth every evening.  Calcium Carbonate-Vitamin D (CALCIUM 600 + D PO) Take 1 tablet by mouth every morning.  ciprofloxacin (CIPRO) 250 MG tablet Take 1 tablet (250 mg total) by mouth 2 (two) times daily. X 3 days  clopidogrel (PLAVIX) 75 MG tablet Take 75 mg by mouth every morning.  escitalopram (LEXAPRO) 10 MG tablet Take 5 mg by mouth every morning.   febuxostat (ULORIC) 40 MG tablet Take 80 mg by mouth every morning.  furosemide (LASIX) 20 MG tablet Take 20 mg by mouth every morning.   levothyroxine (SYNTHROID, LEVOTHROID) 150 MCG tablet Take 150-225 mcg by mouth See admin instructions. Takes Sunday-Friday & (1.5 tab) on Sat  metoprolol succinate (TOPROL-XL) 25 MG 24 hr tablet Take 25 mg by mouth every morning.  Multiple Vitamin (MULTIVITAMIN WITH MINERALS) TABS Take 1 tablet by mouth every morning.   pantoprazole (PROTONIX) 40 MG tablet Take 40 mg by mouth every morning.   potassium chloride (K-DUR,KLOR-CON) 10 MEQ tablet Take 10 mEq by mouth every morning.   ranolazine (RANEXA) 500 MG 12 hr tablet Take 500 mg by mouth 2 (two) times daily.  vitamin C (ASCORBIC ACID) 500 MG tablet Take 500 mg by mouth every morning.   nitroGLYCERIN (NITROSTAT) 0.4 MG SL tablet Place 0.4 mg under the tongue every 5 (five) minutes as needed. For chest pain  Vitamin D, Ergocalciferol, (DRISDOL) 50000 UNITS CAPS Take 50,000 Units by mouth every 7 (seven) days. Takes on Tuesday   Scheduled Meds: Continuous Infusions: PRN Meds:.sodium chloride, acetaminophen,  ALPRAZolam, nitroGLYCERIN, ondansetron (ZOFRAN) IV, sodium chloride, zolpidem  Allergies:  Allergies  Allergen Reactions  . Codeine Itching and Rash  . Statins Itching and Rash  . Sulfa Antibiotics Itching and Rash    History   Social History  . Marital Status: Married    Spouse Name: N/A    Number of Children: N/A  . Years of Education: N/A   Occupational History  . Retired    Social History Main Topics  . Smoking status: Never Smoker   . Smokeless tobacco: Never Used  . Alcohol Use: No  . Drug Use: No  . Sexually Active: No   Other Topics Concern  . Not on file   Social History Narrative  . No narrative on file    Family History  Problem Relation Age of Onset  . Lung cancer Father   . Heart attack Mother   . Coronary artery disease Mother   . Coronary artery disease Brother     with CABG  . Heart attack Brother   . Heart attack Brother   . Lung cancer Brother   . Coronary artery disease Sister   . Hypertension Mother   . Hypertension Brother   . Hypertension Brother    Family Status  Relation Status Death Age  . Father Deceased 67    lung cancer  . Mother Deceased 3    MI   . Brother Deceased 43    MI  . Brother Deceased 36    MI ( with CAD had CABG)  . Brother Deceased 43    lung cancer  . Sister      with CAD had CABG    Review of Systems: Poor balance and fell 4/25, admitted till 4/27. Multiple MS complaints and has staples in the upper occipital area. Recent UTI, treated. Full 14-point review of systems otherwise negative except as noted above.  Physical Exam: Blood pressure 156/77, pulse 77, temperature 97.7 F (36.5 C), temperature source Oral, resp. rate 18, height 5\' 6"  (1.676 m), weight 210 lb 9.6 oz (95.528 kg), SpO2 97.00%. General: elderly female, NAD,  Head: Normocephalic, atraumatic, sclera non-icteric, no xanthomas, nares are without discharge. Dentition: poor Neck: No carotid bruits. JVD not elevated. No thyromegally Lungs:  Good expansion bilaterally. without wheezes or rhonchi. Chest wall is tender Heart: Regular rate and rhythm with S1 S2.  No S3 or S4.  No murmur, no rubs, or gallops appreciated. Abdomen: Soft, non-tender, non-distended with normoactive bowel sounds. No hepatomegaly. No rebound/guarding. No obvious abdominal masses. Msk:  Strength and tone appear weak for age. No joint deformities or effusions, no spine or costo-vertebral angle tenderness. Some anterior chest wall tenderness Extremities: No clubbing or cyanosis. No edema.  Distal pedal pulses are  2+ in 4 extrem Neuro: asleep Psych:  Would not wake up for me Skin: No rashes or lesions noted  Labs:   Lab Results  Component Value Date   WBC 8.1 10/31/2012   HGB 10.9* 10/31/2012   HCT 32.0* 10/31/2012   MCV 87.2 10/31/2012   PLT 301 10/31/2012   No results found for this basename: INR,  in the last 72 hours   Recent Labs Lab 10/30/12 0453  NA 137  K 4.0  CL 103  CO2 24  BUN 24*  CREATININE 1.22*  CALCIUM 8.7  PROT 6.3  BILITOT 0.4  ALKPHOS 87  ALT 14  AST 18  GLUCOSE 112*    Recent Labs  10/29/12 1400 10/29/12 2354 10/30/12 0453 10/30/12 1108  TROPONINI <0.30 <0.30 <0.30 <0.30   No results found for this basename: TROPIPOC,  in the last 72 hours  Pro B Natriuretic peptide (BNP)  Date/Time Value Range Status  10/29/2012  2:00 PM 415.5  0 - 450 pg/mL Final  07/12/2009  5:20 AM 1001.0* 0.0 - 100.0 pg/mL Final   Radiology/Studies: Dg Chest 2 View 10/29/2012  *RADIOLOGY REPORT*  Clinical Data: Chest pain, shortness of breath.  CHEST - 2 VIEW  Comparison: 10/26/2012  Findings: Left pacer is in place with leads in the right atrium and right ventricle.  Mild cardiomegaly.  Prior CABG.  Lungs are clear. No effusions.  No acute bony abnormality.  IMPRESSION: Cardiomegaly.  No acute cardiopulmonary disease.   Original Report Authenticated By: Charlett Nose, M.D.    Cardiac Cath: 2011 Pt had MI post-CABG, medical therapy recommended, see  printout.  Echo:07/31/2012 Study Conclusions - Left ventricle: The cavity size was normal. Wall thickness was increased in a pattern of mild LVH. Systolic function was normal. The estimated ejection fraction was in the range of 60% to 65%. Wall motion was normal; there were no regional wall motion abnormalities. - Mitral valve: Mild regurgitation. - Left atrium: The atrium was moderately dilated. - Right atrium: The atrium was mildly dilated. - Pulmonary arteries: PA peak pressure: 48mm Hg (S).  ECG: 25-Oct-2012 16:13:52  SINUS RHYTHM ~ normal P axis, V-rate 50- 99 EARLY PRECORDIAL R/S TRANSITION ~ QRS area positive in V2 NONSPECIFIC T ABNORMALITIES, ANTERIOR LEADS ~ T <-0.60mV, V2-V4 Vent. rate 70 BPM PR interval 202 ms QRS duration 98 ms QT/QTc 436/470 ms P-R-T axes 0 38 70  ASSESSMENT AND PLAN:  Principal Problem:   Precordial pain Active Problems:   Acute blood loss anemia   DOE (dyspnea on exertion)   Acute back pain less than 4 weeks duration  1. Non-cardiac chest pain: the patient has ruled out for MI.  CTA of the lungs was negative for PE.  This pain is more c/w chest wall pain pain - tender to palpitation.  Xray of back reveals several new ( worsened) compression fractures that are likely the cause of her pain.    She is falling frequently.  She has had several visits to the ER.  She spent some time at a nursing home but now is back home.  She has a family member who is trying to care for her.  At this point, I think we we need to consider long term SNF placement.  I have discussed with Dr. [name] who agrees.    Will begin the placement process.   The family is not present this am to discuss her placement.  Will get OT/PT consult.  2. CVA:  3. COPD  4.  Subdural hematoma   Alvia Grove., MD, [facility] 10/31/2012, 8:42 AM Office - [phone] Pager [phone]]

## 2012-11-01 ENCOUNTER — Encounter: Payer: Self-pay | Admitting: Internal Medicine

## 2012-11-01 ENCOUNTER — Other Ambulatory Visit: Payer: Self-pay | Admitting: Internal Medicine

## 2012-11-01 LAB — PACEMAKER DEVICE OBSERVATION
AL AMPLITUDE: 2.3 mv
AL THRESHOLD: 1 V
BAMS-0001: 170 {beats}/min
BATTERY VOLTAGE: 2.99 V
RV LEAD AMPLITUDE: 6.6 mv

## 2012-11-01 NOTE — Progress Notes (Signed)
Inpatient check done by industry

## 2012-11-03 NOTE — Progress Notes (Signed)
Patient discussed with Ronnie Derby, RNCM. Patient and her family are refusing SNF placement are will return home at d/c.  They do not want home health.  Patient was seen by Vickii Penna, LCSWA for SNF on 10/30/12.  No further CSW needs identified.  CSW signing off.  Lorri Frederick. [place]  [phone]

## 2012-11-03 NOTE — Clinical Social Work Placement (Addendum)
    Clinical Social Work Department CLINICAL SOCIAL WORK PLACEMENT NOTE 11/03/2012  Patient:  [name], [name]  Account Number:  [id number] Admit date:  10/29/2012  Clinical Social Worker:  Read Drivers  Date/time:  10/30/2012 03:50 PM  Clinical Social Work is seeking post-discharge placement for this patient at the following level of care:   SKILLED NURSING   (*CSW will update this form in Epic as items are completed)   10/30/2012  Patient/family provided with [facility] Department of Clinical Social Work's list of facilities offering this level of care within the geographic area requested by the patient (or if unable, by the patient's family).  10/30/2012  Patient/family informed of their freedom to choose among providers that offer the needed level of care, that participate in Medicare, Medicaid or managed care program needed by the patient, have an available bed and are willing to accept the patient.  10/30/2012  Patient/family informed of [facility]' ownership interest in [facility], as well as of the fact that they are under no obligation to receive care at this facility.  PASARR submitted to EDS on 10/30/2012 PASARR number received from [employer] on 10/30/2012  FL2 transmitted to all facilities in geographic area requested by pt/family on  10/30/2012 FL2 transmitted to all facilities within larger geographic area on   Patient informed that his/her managed care company has contracts with or will negotiate with  certain facilities, including the following:     Patient/family informed of bed offers received:   Patient chooses bed at  Physician recommends and patient chooses bed at    Patient to be transferred to  on   Patient to be transferred to facility by   The following physician request were entered in Epic:   Additional Comments: Patent's family has elected to go home and they will continue toprovide her care. They do not wish any home health per [facility].   CSW signing off.

## 2012-11-18 ENCOUNTER — Encounter: Payer: Self-pay | Admitting: Physician Assistant

## 2012-11-18 ENCOUNTER — Ambulatory Visit (INDEPENDENT_AMBULATORY_CARE_PROVIDER_SITE_OTHER): Payer: Medicare Other | Admitting: Physician Assistant

## 2012-11-18 VITALS — BP 142/66 | HR 70 | Ht 66.0 in | Wt 214.2 lb

## 2012-11-18 DIAGNOSIS — I4891 Unspecified atrial fibrillation: Secondary | ICD-10-CM

## 2012-11-18 DIAGNOSIS — Z950 Presence of cardiac pacemaker: Secondary | ICD-10-CM

## 2012-11-18 DIAGNOSIS — R079 Chest pain, unspecified: Secondary | ICD-10-CM

## 2012-11-18 DIAGNOSIS — I2510 Atherosclerotic heart disease of native coronary artery without angina pectoris: Secondary | ICD-10-CM

## 2012-11-18 DIAGNOSIS — R296 Repeated falls: Secondary | ICD-10-CM

## 2012-11-18 DIAGNOSIS — I10 Essential (primary) hypertension: Secondary | ICD-10-CM

## 2012-11-18 DIAGNOSIS — Z9181 History of falling: Secondary | ICD-10-CM

## 2012-11-18 NOTE — Patient Instructions (Addendum)
Your physician recommends that you continue on your current medications as directed. Please refer to the Current Medication list given to you today.  Your physician recommends that you schedule a follow-up appointment in: 4-6 weeks with Dr. Nahser.  

## 2012-11-18 NOTE — Progress Notes (Signed)
[address] [place], [place]  [address] Phone: [phone] Fax:  [phone]  Date:  11/18/2012   ID:  [name], DOB 1930/03/22, MRN [id number]  PCP:  Ezequiel Kayser, MD  Primary Cardiologist:  Dr. Delane Ginger   Primary Electrophysiologist:  Dr. Hillis Range    History of Present Illness: [name] is a 79 y.o. female who returns for f/u after a recent admission to the hospital 4/29-5/1 with chest pain.  She has a hx of CAD, s/p prior stenting of the LAD, s/p CABG and MAZE procedure in 06/2009, diastolic CHF, SSS, s/p pacer, HTN, hypothyroidism, AFib, COPD.  She has had problems with frequent falls recently.  She is not a coumadin candidate.  LHC 08/2009: SVG-RCA patent, LIMA-distal LAD patent, EF 35-40%.  Echo 07/2012 EF 60-65%, mild MR, moderate LAE, mild RAE, PASP 48.  She was admitted 4/25-4/27 after falling.  She was admitted again 4/29-5/1 with chest pain and shortness of breath. She had an elevated d-dimer. Chest CT demonstrated no evidence of pulmonary embolism. She had stable pulmonary nodules. She did have thoracic vertebral compression fractures. Skilled nursing placement was recommended. The patient refused.  She is now living at home with her husband. She continues to note chest pain. This mainly occurs at night. It is no better or no worse since discharge from the hospital. Seems to be worse with palpation. She denies significant dyspnea. She sleeps on 2 pillows chronically. She denies PND or edema. She denies syncope.  Labs (5/14):  K 4, Cr 1.22, ALT 14, Hgb 10.9  Wt Readings from Last 3 Encounters:  11/18/12 214 lb 3.2 oz (97.16 kg)  10/31/12 210 lb 9.6 oz (95.528 kg)  10/25/12 218 lb (98.884 kg)     Past Medical History  Diagnosis Date  . CHF (congestive heart failure)   . Hypertension   . CVA (cerebral vascular accident)   . Hypothyroidism   . Sick sinus syndrome     status post pacemaker implantation  . Paroxysmal atrial  fibrillation   . Subendocardial myocardial infarction   . Subdural hematoma   . CAD (coronary artery disease)     post PTCA and stenting of the LAD and Status post maze procedure  . History of gastrointestinal bleeding   . GERD (gastroesophageal reflux disease)   . Osteoporosis     Current Outpatient Prescriptions  Medication Sig Dispense Refill  . acetaminophen (TYLENOL) 500 MG tablet Take 1,000 mg by mouth every 6 (six) hours as needed for pain.      Marland Kitchen amiodarone (PACERONE) 200 MG tablet Take 200 mg by mouth every Monday, Wednesday, and Friday.      Marland Kitchen aspirin EC 81 MG tablet Take 81 mg by mouth every evening.       . Biotin 1000 MCG tablet Take 1,000 mcg by mouth every evening.      . Calcium Carbonate-Vitamin D (CALCIUM 600 + D PO) Take 1 tablet by mouth every morning.      . clopidogrel (PLAVIX) 75 MG tablet Take 75 mg by mouth every morning.      . escitalopram (LEXAPRO) 10 MG tablet Take 5 mg by mouth every morning.       . febuxostat (ULORIC) 40 MG tablet Take 80 mg by mouth every morning.      . furosemide (LASIX) 20 MG tablet Take 20 mg by mouth every morning.       Marland Kitchen levothyroxine (SYNTHROID, LEVOTHROID) 150 MCG tablet  Take 150-225 mcg by mouth See admin instructions. Takes Sunday-Friday & (1.5 tab) on Sat      . LORazepam (ATIVAN) 0.5 MG tablet Take 0.5-1 tablets (0.25-0.5 mg total) by mouth every 8 (eight) hours.  5 tablet  0  . metoprolol succinate (TOPROL-XL) 25 MG 24 hr tablet Take 25 mg by mouth every morning.       . Multiple Vitamin (MULTIVITAMIN WITH MINERALS) TABS Take 1 tablet by mouth every morning.       . nitroGLYCERIN (NITROSTAT) 0.4 MG SL tablet Place 0.4 mg under the tongue every 5 (five) minutes as needed. For chest pain      . pantoprazole (PROTONIX) 40 MG tablet Take 40 mg by mouth every morning.       . potassium chloride (K-DUR,KLOR-CON) 10 MEQ tablet Take 10 mEq by mouth every morning.       . ranolazine (RANEXA) 500 MG 12 hr tablet Take 500  mg by mouth 2 (two) times daily.      . vitamin C (ASCORBIC ACID) 500 MG tablet Take 500 mg by mouth every morning.       . Vitamin D, Ergocalciferol, (DRISDOL) 50000 UNITS CAPS Take 50,000 Units by mouth every 7 (seven) days. Takes on Tuesday       No current facility-administered medications for this visit.    Allergies:    Allergies  Allergen Reactions  . Codeine Itching and Rash  . Statins Itching and Rash  . Sulfa Antibiotics Itching and Rash    Social History:  The patient  reports that she has never smoked. She has never used smokeless tobacco. She reports that she does not drink alcohol or use illicit drugs.   ROS:  Please see the history of present illness.   She has a nonproductive cough. This is fairly chronic. She denies fever.   All other systems reviewed and negative.   PHYSICAL EXAM: VS:  BP 142/66  Pulse 70  Ht 5\' 6"  (1.676 m)  Wt 214 lb 3.2 oz (97.16 kg)  BMI 34.59 kg/m2 Well nourished, well developed, in no acute distress HEENT: normal Neck: no JVD at 90 Cardiac:  normal S1, S2; RRR; no murmur Lungs:  clear to auscultation bilaterally, no wheezing, rhonchi or rales Abd: soft, nontender, no hepatomegaly Ext: no edema Skin: warm and dry Neuro:  CNs 2-12 intact, no focal abnormalities noted  EKG:  Atrial paced, HR 70, incomplete RBBB, T-wave inversions in V1-V2, no change from prior tracing     ASSESSMENT AND PLAN:  1. Chest Pain:  Atypical for ischemia. Symptoms are likely related to vertebral compression fractures from her recent falls. No further cardiac workup at this time unless her symptoms should change or worsen. 2. CAD: As noted, no further ischemic workup. Continue dual antiplatelet therapy. 3. Hypertension: Controlled. 4. Frequent Falls:  She continues to remain a fall risk. She has significant dementia. She has refused skilled nursing facility placement. 5. Atrial Fibrillation:  Maintaining NSR on amiodarone. She is not an anticoagulation  candidate. 6. Status Post Pacemaker:  Followup with EP as directed. 7. Disposition: Followup with Dr. Elease Hashimoto in 6 weeks.  Luna Glasgow, PA-C  11:42 AM 11/18/2012

## 2012-11-20 ENCOUNTER — Ambulatory Visit (HOSPITAL_COMMUNITY)
Admission: RE | Admit: 2012-11-20 | Discharge: 2012-11-20 | Disposition: A | Discharge status: Home / Self Care | Payer: Medicare Other | Source: Ambulatory Visit | Attending: Internal Medicine | Admitting: Internal Medicine

## 2012-11-20 DIAGNOSIS — M810 Age-related osteoporosis without current pathological fracture: Secondary | ICD-10-CM | POA: Insufficient documentation

## 2012-11-20 MED ORDER — ZOLEDRONIC ACID 5 MG/100ML IV SOLN
5.0000 mg | Freq: Once | INTRAVENOUS | Status: AC
  Administered 2012-11-20: 5 mg via INTRAVENOUS
  Filled 2012-11-20: qty 100

## 2012-11-20 MED ORDER — SODIUM CHLORIDE 0.9 % IV SOLN
Freq: Once | INTRAVENOUS | Status: AC
  Administered 2012-11-20: 11:00:00 via INTRAVENOUS

## 2012-12-28 ENCOUNTER — Other Ambulatory Visit: Payer: Self-pay | Admitting: Cardiovascular Disease

## 2012-12-30 NOTE — Telephone Encounter (Signed)
Fax Received. Refill Completed. [name] (R.M.A)   

## 2013-01-29 ENCOUNTER — Ambulatory Visit (INDEPENDENT_AMBULATORY_CARE_PROVIDER_SITE_OTHER): Payer: Medicare Other | Admitting: Cardiovascular Disease

## 2013-01-29 ENCOUNTER — Encounter: Payer: Self-pay | Admitting: Cardiovascular Disease

## 2013-01-29 VITALS — BP 140/70 | HR 72 | Ht 68.0 in | Wt 219.0 lb

## 2013-01-29 DIAGNOSIS — I2510 Atherosclerotic heart disease of native coronary artery without angina pectoris: Secondary | ICD-10-CM

## 2013-01-29 DIAGNOSIS — I4891 Unspecified atrial fibrillation: Secondary | ICD-10-CM

## 2013-01-29 MED ORDER — RANOLAZINE ER 500 MG PO TB12: ORAL_TABLET | ORAL | Status: DC

## 2013-01-29 NOTE — Patient Instructions (Addendum)
Your physician recommends that you schedule a follow-up appointment in: as needed, please follow up with your primary doctor  Keep appointments for your pacemaker.   Your physician recommends that you continue on your current medications as directed. Please refer to the Current Medication list given to you today.

## 2013-01-29 NOTE — Assessment & Plan Note (Signed)
Will continue with the 3 times a week amiodarone.  She will be seeing Dr Johney Frame.  Dr. [name] can also check her liver enzyme and TSH.  I will be glad to see her for further management of this if needed.

## 2013-01-29 NOTE — Assessment & Plan Note (Signed)
[name] remains stable.  Given her overall decline in her condition, I will be seeing her back on an a needed basis.

## 2013-01-29 NOTE — Progress Notes (Signed)
[name] Date of Birth  1930/04/13 [facility]     [place] [facility]  [address]    [address]   [address] [place], [place]  [address]    [place], [place]  [address] [phone]  Fax  [phone]  [phone]  Fax [phone]  Problem list: 1. Coronary artery disease-status post PTCA and stenting of the LAD 2. Status post CABG and Maze procedure, December 2010 B. Congestive heart failure 4. Hypertension 5. Hypothyroidism  6. Sick sinus syndrome-status post pacemaker implantation 7. History of atrial fibrillation 8. COPD 9. Subdural hematoma 10. Frequent falls / loss of balance   History of Present Illness:  Patient is doing well today.  She complains of poor eye sight this am.  She has continued to have dyspnea.  She does not exercise anymore due to the high risk for falling.  She fell again this week and hit the back of her head.  She continues to be very unsteady with walking. She walks with the assistance of a walker.   January 29, 2013:  She has continued to have frequent falls.   She fell again 3 days ago.  Family members stay with her constantly.   Current Outpatient Prescriptions on File Prior to Visit  Medication Sig Dispense Refill  . acetaminophen (TYLENOL) 500 MG tablet Take 1,000 mg by mouth every 6 (six) hours as needed for pain.      Marland Kitchen amiodarone (PACERONE) 200 MG tablet Take 200 mg by mouth every Monday, Wednesday, and Friday.      Marland Kitchen aspirin EC 81 MG tablet Take 81 mg by mouth every evening.       . Biotin 1000 MCG tablet Take 1,000 mcg by mouth every evening.      . Calcium Carbonate-Vitamin D (CALCIUM 600 + D PO) Take 1 tablet by mouth every morning.      . clopidogrel (PLAVIX) 75 MG tablet Take 75 mg by mouth every morning.      . escitalopram (LEXAPRO) 10 MG tablet Take 5 mg by mouth every morning.       . febuxostat (ULORIC) 40 MG tablet Take 80 mg by mouth every morning.      . furosemide (LASIX) 20 MG tablet Take 20 mg by  mouth every morning.       Marland Kitchen levothyroxine (SYNTHROID, LEVOTHROID) 150 MCG tablet Take 150-225 mcg by mouth See admin instructions. Takes Sunday-Friday & (1.5 tab) on Sat      . LORazepam (ATIVAN) 0.5 MG tablet Take 0.5-1 tablets (0.25-0.5 mg total) by mouth every 8 (eight) hours.  5 tablet  0  . metoprolol succinate (TOPROL-XL) 25 MG 24 hr tablet Take 25 mg by mouth every morning.       . Multiple Vitamin (MULTIVITAMIN WITH MINERALS) TABS Take 1 tablet by mouth every morning.       . nitroGLYCERIN (NITROSTAT) 0.4 MG SL tablet Place 0.4 mg under the tongue every 5 (five) minutes as needed. For chest pain      . pantoprazole (PROTONIX) 40 MG tablet Take 40 mg by mouth every morning.       . potassium chloride (K-DUR,KLOR-CON) 10 MEQ tablet Take 10 mEq by mouth every morning.       Marland Kitchen RANEXA 500 MG 12 hr tablet TAKE ONE TABLET BY MOUTH TWICE DAILY  60 tablet  1  . vitamin C (ASCORBIC ACID) 500 MG tablet Take 500 mg by mouth every morning.       Marland Kitchen  Vitamin D, Ergocalciferol, (DRISDOL) 50000 UNITS CAPS Take 50,000 Units by mouth every 7 (seven) days. Takes on Tuesday       No current facility-administered medications on file prior to visit.    Allergies  Allergen Reactions  . Codeine Itching and Rash  . Statins Itching and Rash  . Sulfa Antibiotics Itching and Rash    Past Medical History  Diagnosis Date  . CHF (congestive heart failure)   . Hypertension   . CVA (cerebral vascular accident)   . Hypothyroidism   . Sick sinus syndrome     status post pacemaker implantation  . Paroxysmal atrial fibrillation   . Subendocardial myocardial infarction   . Subdural hematoma   . CAD (coronary artery disease)     post PTCA and stenting of the LAD and Status post maze procedure  . History of gastrointestinal bleeding   . GERD (gastroesophageal reflux disease)   . Osteoporosis     Past Surgical History  Procedure Laterality Date  . Coronary artery bypass graft  January 2011     LIMA-LAD, SVG-RCA, SVG-PL, Cox-Maze  . Cardiac catheterization  08/17/2009    Est. EF is 35% to 40%  . Pacemaker insertion  September 2010    MDT implanted by Dr Reyes Ivan for SSS  . Abdominal hysterectomy    . Insert / replace / remove pacemaker    . Joint replacement      right knee  . Fracture surgery      left wrist    History  Smoking status  . Never Smoker   Smokeless tobacco  . Never Used    History  Alcohol Use No    Family History  Problem Relation Age of Onset  . Lung cancer Father   . Heart attack Mother   . Coronary artery disease Mother   . Coronary artery disease Brother     with CABG  . Heart attack Brother   . Heart attack Brother   . Lung cancer Brother   . Coronary artery disease Sister   . Hypertension Mother   . Hypertension Brother   . Hypertension Brother     Reviw of Systems:  Reviewed in the HPI.  All other systems are negative.  Physical Exam: Blood pressure 140/70, pulse 72, height 5\' 8"  (1.727 m), weight 219 lb (99.338 kg). General: Well developed, well nourished, in no acute distress.  Head: Normocephalic, atraumatic, sclera non-icteric, mucus membranes are moist,   Neck: Supple. Negative for carotid bruits. JVD not elevated.  Lungs: Clear bilaterally to auscultation without wheezes, rales, or rhonchi. Breathing is unlabored.  Heart: RRR with S1 S2. No murmurs, rubs, or gallops appreciated.  Abdomen: Soft, non-tender, non-distended with normoactive bowel sounds. No hepatomegaly. No rebound/guarding. No obvious abdominal masses.  Msk:  Strength and tone appear normal for age.  Extremities: No clubbing or cyanosis. 1-2+ pitting edema.  Distal pedal pulses are 2+ and equal bilaterally.  Neuro: Alert and oriented X 3. Moves all extremities spontaneously.  Psych:  Responds to questions appropriately with a normal affect.  She has made a significant recovery in her speech.  ECG: Sept. 5, 2013 - Atrial pacing, inc. RBBB Assessment /  Plan:

## 2013-01-29 NOTE — Assessment & Plan Note (Signed)
[name] not been having any episodes of angina. [name] currently on several antianginal's as well as Ranexa. We'll see if Dr. [name] will start refilling  her Ranexa since we do not plan on seeing her on a regular basis.  I would be happy to see her in the future if needed. Her daughter agrees that minimizing her trips out of the house would be beneficial.

## 2013-02-13 ENCOUNTER — Encounter: Payer: Self-pay | Admitting: Internal Medicine

## 2013-03-17 ENCOUNTER — Other Ambulatory Visit: Payer: Self-pay | Admitting: *Deleted

## 2013-03-17 MED ORDER — AMIODARONE HCL 200 MG PO TABS: 200.0000 mg | ORAL_TABLET | ORAL | Status: AC

## 2013-04-28 ENCOUNTER — Encounter: Payer: Self-pay | Admitting: Cardiovascular Disease

## 2013-05-09 ENCOUNTER — Ambulatory Visit (INDEPENDENT_AMBULATORY_CARE_PROVIDER_SITE_OTHER): Payer: Medicare Other | Admitting: Internal Medicine

## 2013-05-09 ENCOUNTER — Encounter: Payer: Self-pay | Admitting: Internal Medicine

## 2013-05-09 VITALS — BP 157/72 | HR 70 | Ht 67.0 in | Wt 215.0 lb

## 2013-05-09 DIAGNOSIS — I4891 Unspecified atrial fibrillation: Secondary | ICD-10-CM

## 2013-05-09 DIAGNOSIS — I2510 Atherosclerotic heart disease of native coronary artery without angina pectoris: Secondary | ICD-10-CM

## 2013-05-09 DIAGNOSIS — I495 Sick sinus syndrome: Secondary | ICD-10-CM

## 2013-05-09 LAB — MDC_IDC_ENUM_SESS_TYPE_INCLINIC
Brady Statistic RA Percent Paced: 99.9 %
Brady Statistic RV Percent Paced: 0.1 %
Lead Channel Pacing Threshold Amplitude: 1 V
Lead Channel Pacing Threshold Amplitude: 1 V
Lead Channel Pacing Threshold Pulse Width: 0.4 ms
Lead Channel Sensing Intrinsic Amplitude: 1.9 mV
Lead Channel Setting Pacing Amplitude: 2 V
Lead Channel Setting Pacing Pulse Width: 0.6 ms
Lead Channel Setting Sensing Sensitivity: 0.9 mV

## 2013-05-09 NOTE — Patient Instructions (Addendum)
Remote monitoring is used to monitor your pacemaker from home. This monitoring reduces the number of office visits required to check your device to one time per year. It allows [place] to keep an eye on the functioning of your device to ensure it is working properly. You are scheduled for a device check from home on 08-11-2013. You may send your transmission at any time that day. If you have a wireless device, the transmission will be sent automatically. After your physician reviews your transmission, you will receive a postcard with your next transmission date.    Your physician recommends that you schedule a follow-up appointment in: 12 months with Domenic Polite, PA

## 2013-05-09 NOTE — Progress Notes (Signed)
PCP: Madeline Kayser, MD Primary Cardiologist:  [name] is a 79 y.o. female who presents today for routine electrophysiology followup.  Since last being seen in our clinic, the patient reports doing very well.    Today, she denies symptoms of palpitations, chest pain, shortness of breath, syncope  or lower extremity edema.  The patient is otherwise without complaint today.   Past Medical History  Diagnosis Date  . CHF (congestive heart failure)   . Hypertension   . CVA (cerebral vascular accident)   . Hypothyroidism   . Sick sinus syndrome     status post pacemaker implantation  . Paroxysmal atrial fibrillation   . Subendocardial myocardial infarction   . Subdural hematoma   . CAD (coronary artery disease)     post PTCA and stenting of the LAD and Status post maze procedure  . History of gastrointestinal bleeding   . GERD (gastroesophageal reflux disease)   . Osteoporosis    Past Surgical History  Procedure Laterality Date  . Coronary artery bypass graft  January 2011    LIMA-LAD, SVG-RCA, SVG-PL, Cox-Maze  . Cardiac catheterization  08/17/2009    Est. EF is 35% to 40%  . Pacemaker insertion  September 2010    MDT implanted by Madeline Reyes Ivan for SSS  . Abdominal hysterectomy    . Insert / replace / remove pacemaker    . Joint replacement      right knee  . Fracture surgery      left wrist    Current Outpatient Prescriptions  Medication Sig Dispense Refill  . acetaminophen (TYLENOL) 500 MG tablet Take 1,000 mg by mouth every 6 (six) hours as needed for pain.      Marland Kitchen amiodarone (PACERONE) 200 MG tablet Take 1 tablet (200 mg total) by mouth every Monday, Wednesday, and Friday.  30 tablet  3  . aspirin EC 81 MG tablet Take 81 mg by mouth every evening.       . Biotin 1000 MCG tablet Take 1,000 mcg by mouth every evening.      . Calcium Carbonate-Vitamin D (CALCIUM 600 + D PO) Take 1 tablet by mouth every morning.      . clopidogrel (PLAVIX) 75 MG tablet Take 75 mg  by mouth every morning.      Marland Kitchen COLCRYS 0.6 MG tablet 0.6 mg as needed.       Marland Kitchen escitalopram (LEXAPRO) 10 MG tablet Take 5 mg by mouth every morning.       . febuxostat (ULORIC) 40 MG tablet Take 80 mg by mouth every morning.      . furosemide (LASIX) 20 MG tablet Take 20 mg by mouth every morning.       Marland Kitchen levothyroxine (SYNTHROID, LEVOTHROID) 150 MCG tablet Take 150-225 mcg by mouth See admin instructions. Takes Sunday-Friday & (1.5 tab) on Sat      . LORazepam (ATIVAN) 0.5 MG tablet Take 0.5-1 tablets (0.25-0.5 mg total) by mouth every 8 (eight) hours.  5 tablet  0  . metoprolol succinate (TOPROL-XL) 25 MG 24 hr tablet Take 25 mg by mouth every morning.       . Multiple Vitamin (MULTIVITAMIN WITH MINERALS) TABS Take 1 tablet by mouth every morning.       . nitroGLYCERIN (NITROSTAT) 0.4 MG SL tablet Place 0.4 mg under the tongue every 5 (five) minutes as needed. For chest pain      . pantoprazole (PROTONIX) 40 MG tablet Take 40  mg by mouth every morning.       . potassium chloride (K-DUR,KLOR-CON) 10 MEQ tablet Take 10 mEq by mouth every morning.       . ranolazine (RANEXA) 500 MG 12 hr tablet TAKE ONE TABLET BY MOUTH TWICE DAILY  60 tablet  11  . vitamin C (ASCORBIC ACID) 500 MG tablet Take 500 mg by mouth every morning.       . Vitamin D, Ergocalciferol, (DRISDOL) 50000 UNITS CAPS Take 50,000 Units by mouth every 7 (seven) days. Takes on Tuesday       No current facility-administered medications for this visit.    Physical Exam: Filed Vitals:   05/09/13 1055  BP: 157/72  Pulse: 70  Height: 5\' 7"  (1.702 m)  Weight: 215 lb (97.523 kg)    GEN- The patient is elderly and obese appearing, alert and oriented x 3 today.   Head- normocephalic, atraumatic Eyes-  Sclera clear, conjunctiva pink Ears- hearing intact Oropharynx- clear Lungs- Clear to ausculation bilaterally, normal work of breathing Chest- pacemaker pocket is well healed Heart- Regular rate and rhythm, no  murmurs, rubs or gallops, PMI not laterally displaced GI- soft, NT, ND, + BS Extremities- no clubbing, cyanosis, or edema  Pacemaker interrogation- reviewed in detail today,  See PACEART report ekg today reveals atrial pacing, RBBB  Assessment and Plan:   SINUS BRADYCARDIA/ SSS  Normal pacemaker function  See Pace Art report  No changes today   ATRIAL FIBRILLATION  Pacemaker interrogation reveals no episodes of afib over the past 3 years.  Given frequent falls, she would not be a candidate for anticoagulation  CAD   Stable  No change required today Follow-up with Madeline Elease Hashimoto  Return to the device clinic in 1 year

## 2013-05-16 ENCOUNTER — Encounter: Payer: Medicare Other | Admitting: Internal Medicine

## 2013-07-11 ENCOUNTER — Emergency Department (HOSPITAL_COMMUNITY)
Admission: EM | Admit: 2013-07-11 | Discharge: 2013-07-11 | Disposition: A | Discharge status: Home / Self Care | Payer: Medicare HMO | Attending: Emergency Medicine | Admitting: Emergency Medicine

## 2013-07-11 ENCOUNTER — Emergency Department (HOSPITAL_COMMUNITY): Payer: Medicare HMO

## 2013-07-11 ENCOUNTER — Encounter (HOSPITAL_COMMUNITY): Payer: Self-pay | Admitting: Emergency Medicine

## 2013-07-11 DIAGNOSIS — Z950 Presence of cardiac pacemaker: Secondary | ICD-10-CM | POA: Insufficient documentation

## 2013-07-11 DIAGNOSIS — Z951 Presence of aortocoronary bypass graft: Secondary | ICD-10-CM | POA: Insufficient documentation

## 2013-07-11 DIAGNOSIS — I509 Heart failure, unspecified: Secondary | ICD-10-CM | POA: Insufficient documentation

## 2013-07-11 DIAGNOSIS — IMO0002 Reserved for concepts with insufficient information to code with codable children: Secondary | ICD-10-CM | POA: Insufficient documentation

## 2013-07-11 DIAGNOSIS — M810 Age-related osteoporosis without current pathological fracture: Secondary | ICD-10-CM | POA: Insufficient documentation

## 2013-07-11 DIAGNOSIS — W050XXA Fall from non-moving wheelchair, initial encounter: Secondary | ICD-10-CM | POA: Insufficient documentation

## 2013-07-11 DIAGNOSIS — E039 Hypothyroidism, unspecified: Secondary | ICD-10-CM | POA: Insufficient documentation

## 2013-07-11 DIAGNOSIS — S0990XA Unspecified injury of head, initial encounter: Secondary | ICD-10-CM | POA: Insufficient documentation

## 2013-07-11 DIAGNOSIS — Z8673 Personal history of transient ischemic attack (TIA), and cerebral infarction without residual deficits: Secondary | ICD-10-CM | POA: Insufficient documentation

## 2013-07-11 DIAGNOSIS — Y9389 Activity, other specified: Secondary | ICD-10-CM | POA: Insufficient documentation

## 2013-07-11 DIAGNOSIS — Y929 Unspecified place or not applicable: Secondary | ICD-10-CM | POA: Insufficient documentation

## 2013-07-11 DIAGNOSIS — I4891 Unspecified atrial fibrillation: Secondary | ICD-10-CM | POA: Insufficient documentation

## 2013-07-11 DIAGNOSIS — I10 Essential (primary) hypertension: Secondary | ICD-10-CM | POA: Insufficient documentation

## 2013-07-11 DIAGNOSIS — K219 Gastro-esophageal reflux disease without esophagitis: Secondary | ICD-10-CM | POA: Insufficient documentation

## 2013-07-11 DIAGNOSIS — S93402A Sprain of unspecified ligament of left ankle, initial encounter: Secondary | ICD-10-CM

## 2013-07-11 DIAGNOSIS — S5010XA Contusion of unspecified forearm, initial encounter: Secondary | ICD-10-CM | POA: Insufficient documentation

## 2013-07-11 DIAGNOSIS — Z79899 Other long term (current) drug therapy: Secondary | ICD-10-CM | POA: Insufficient documentation

## 2013-07-11 DIAGNOSIS — Z87828 Personal history of other (healed) physical injury and trauma: Secondary | ICD-10-CM | POA: Insufficient documentation

## 2013-07-11 DIAGNOSIS — I252 Old myocardial infarction: Secondary | ICD-10-CM | POA: Insufficient documentation

## 2013-07-11 DIAGNOSIS — S93409A Sprain of unspecified ligament of unspecified ankle, initial encounter: Secondary | ICD-10-CM | POA: Insufficient documentation

## 2013-07-11 DIAGNOSIS — Z7982 Long term (current) use of aspirin: Secondary | ICD-10-CM | POA: Insufficient documentation

## 2013-07-11 DIAGNOSIS — I2510 Atherosclerotic heart disease of native coronary artery without angina pectoris: Secondary | ICD-10-CM | POA: Insufficient documentation

## 2013-07-11 DIAGNOSIS — Z95818 Presence of other cardiac implants and grafts: Secondary | ICD-10-CM | POA: Insufficient documentation

## 2013-07-11 NOTE — ED Provider Notes (Signed)
CSN: [id number]     Arrival date & time 07/11/13  1651 History  This chart was scribed for non-physician practitioner, Roxy Horseman, PA-C working with Candyce Churn, MD by Greggory Stallion, ED scribe. This patient was seen in room TR09C/TR09C and the patient's care was started at 8:32 PM.   Chief Complaint  Patient presents with  . Fall   The history is provided by the patient and a relative. No language interpreter was used.   HPI Comments: [name] is a 80 y.o. female who presents to the Emergency Department complaining of a fall that occurred yesterday. Pt was trying to get out of hear wheelchair without assistance and fell forward onto carpet. Daughter states she has falls often. She states she hit the front of her head but denies LOC. Pt has sudden onset left foot pain and swelling and left knee pain. Bearing weight worsens the pain. Denies neck pain.   Past Medical History  Diagnosis Date  . CHF (congestive heart failure)   . Hypertension   . CVA (cerebral vascular accident)   . Hypothyroidism   . Sick sinus syndrome     status post pacemaker implantation  . Paroxysmal atrial fibrillation   . Subendocardial myocardial infarction   . Subdural hematoma   . CAD (coronary artery disease)     post PTCA and stenting of the LAD and Status post maze procedure  . History of gastrointestinal bleeding   . GERD (gastroesophageal reflux disease)   . Osteoporosis    Past Surgical History  Procedure Laterality Date  . Coronary artery bypass graft  January 2011    LIMA-LAD, SVG-RCA, SVG-PL, Cox-Maze  . Cardiac catheterization  08/17/2009    Est. EF is 35% to 40%  . Pacemaker insertion  September 2010    MDT implanted by Dr Reyes Ivan for SSS  . Abdominal hysterectomy    . Insert / replace / remove pacemaker    . Joint replacement      right knee  . Fracture surgery      left wrist   Family History  Problem Relation Age of Onset  . Lung cancer Father   . Heart attack  Mother   . Coronary artery disease Mother   . Coronary artery disease Brother     with CABG  . Heart attack Brother   . Heart attack Brother   . Lung cancer Brother   . Coronary artery disease Sister   . Hypertension Mother   . Hypertension Brother   . Hypertension Brother    History  Substance Use Topics  . Smoking status: Never Smoker   . Smokeless tobacco: Never Used  . Alcohol Use: No   OB History   Grav Para Term Preterm Abortions TAB SAB Ect Mult Living                 Review of Systems A complete 10 system review of systems was obtained and all systems are negative except as noted in the HPI and PMH.   Allergies  Codeine; Statins; and Sulfa antibiotics  Home Medications   Current Outpatient Rx  Name  Route  Sig  Dispense  Refill  . acetaminophen (TYLENOL) 500 MG tablet   Oral   Take 1,000 mg by mouth every 6 (six) hours as needed for pain.         Marland Kitchen allopurinol (ZYLOPRIM) 100 MG tablet   Oral   Take 100 mg by mouth 2 (two) times daily.         Marland Kitchen  amiodarone (PACERONE) 200 MG tablet   Oral   Take 1 tablet (200 mg total) by mouth every Monday, Wednesday, and Friday.   30 tablet   3   . aspirin EC 81 MG tablet   Oral   Take 81 mg by mouth every evening.          . Biotin 1000 MCG tablet   Oral   Take 1,000 mcg by mouth every evening.         . Calcium Carbonate-Vitamin D (CALCIUM 600 + D PO)   Oral   Take 1 tablet by mouth every morning.         . clopidogrel (PLAVIX) 75 MG tablet   Oral   Take 75 mg by mouth every morning.         Marland Kitchen. COLCRYS 0.6 MG tablet   Oral   Take 0.6 mg by mouth daily as needed (for gout pain).          Marland Kitchen. escitalopram (LEXAPRO) 10 MG tablet   Oral   Take 5 mg by mouth every morning.          . furosemide (LASIX) 20 MG tablet   Oral   Take 20 mg by mouth every morning.          Marland Kitchen. levothyroxine (SYNTHROID, LEVOTHROID) 150 MCG tablet   Oral   Take 150 mcg by mouth daily before breakfast.           . LORazepam (ATIVAN) 0.5 MG tablet   Oral   Take 0.25-0.5 mg by mouth every 8 (eight) hours as needed for anxiety.         . metoprolol succinate (TOPROL-XL) 25 MG 24 hr tablet   Oral   Take 25 mg by mouth every morning.          . Multiple Vitamin (MULTIVITAMIN WITH MINERALS) TABS   Oral   Take 1 tablet by mouth every morning.          . nitroGLYCERIN (NITROSTAT) 0.4 MG SL tablet   Sublingual   Place 0.4 mg under the tongue every 5 (five) minutes as needed. For chest pain         . pantoprazole (PROTONIX) 40 MG tablet   Oral   Take 40 mg by mouth every morning.          . potassium chloride (K-DUR,KLOR-CON) 10 MEQ tablet   Oral   Take 10 mEq by mouth every morning.          . ranolazine (RANEXA) 500 MG 12 hr tablet   Oral   Take 500 mg by mouth 2 (two) times daily.         . vitamin C (ASCORBIC ACID) 500 MG tablet   Oral   Take 500 mg by mouth every morning.          . Vitamin D, Ergocalciferol, (DRISDOL) 50000 UNITS CAPS   Oral   Take 50,000 Units by mouth every 7 (seven) days. Takes on Tuesday          BP 144/77  Pulse 72  Temp(Src) 97.9 F (36.6 C) (Oral)  Resp 16  SpO2 92%  Physical Exam  Nursing note and vitals reviewed. Constitutional: She is oriented to person, place, and time. She appears well-developed and well-nourished. No distress.  HENT:  Head: Normocephalic and atraumatic.  Eyes: EOM are normal.  Neck: Neck supple. No tracheal deviation present.  Cardiovascular: Normal rate, regular rhythm and normal heart sounds.  Exam reveals no gallop and no friction rub.   No murmur heard. Pulmonary/Chest: Effort normal and breath sounds normal. No respiratory distress. She has no wheezes. She has no rales. She exhibits no tenderness.  Abdominal: She exhibits no distension.  Musculoskeletal: Normal range of motion.  Left lower extremity moderately tender to palpation over the shin. No bony abnormality or deformity of the left knee and  ankle. No cervical spine tenderness, step offs or deformities.   Neurological: She is alert and oriented to person, place, and time.  Skin: Skin is warm and dry.  Very small abrasion to forehead.   Moderate bruising to left lower extremity on the shin.  Psychiatric: She has a normal mood and affect. Her behavior is normal.    ED Course  Procedures (including critical care time)  DIAGNOSTIC STUDIES: Oxygen Saturation is 92% on RA, low by my interpretation.    COORDINATION OF CARE: 8:36 PM-Discussed treatment plan which includes having Dr. Loretha Stapler looking at patient with pt and her daughter at bedside and they agreed to plan.  8:54 PM-Spoke with Dr. Loretha Stapler and he recommends imaging of the head and neck.   Labs Review Labs Reviewed - No data to display Imaging Review Dg Ankle Complete Left  07/11/2013   CLINICAL DATA:  Fall.  Pain about the left lateral malleolus.  EXAM: LEFT ANKLE COMPLETE - 3+ VIEW  COMPARISON:  None.  FINDINGS: Imaged bones, joints and soft tissues appear normal.  IMPRESSION: Negative exam.   Electronically Signed   By: Drusilla Kanner M.D.   On: 07/11/2013 22:22   Ct Head Wo Contrast  07/11/2013   CLINICAL DATA:  Status post fall with a blow to the head.  EXAM: CT HEAD WITHOUT CONTRAST  CT CERVICAL SPINE WITHOUT CONTRAST  TECHNIQUE: Multidetector CT imaging of the head and cervical spine was performed following the standard protocol without intravenous contrast. Multiplanar CT image reconstructions of the cervical spine were also generated.  COMPARISON:  Head and cervical spine CT scan 10/25/2012.  FINDINGS: CT HEAD FINDINGS  Remote left temporal lobe infarct is again seen. There is atrophy and chronic microvascular ischemic change. No evidence of acute intracranial abnormality including infarction, hemorrhage, mass lesion, mass effect, midline shift or abnormal extra-axial fluid collection. No hydrocephalus or pneumocephalus. Hypoplastic mastoid air cells are again seen  bilaterally. There is a small amount fluid in the right mastoid air cells, unchanged. The calvarium is intact.  CT CERVICAL SPINE FINDINGS  No fracture or malalignment of the cervical spine is identified. Degenerative disc disease appears worst at C5-6. Lung apices are clear. Paraspinous structures are unremarkable.  IMPRESSION: No acute finding head or cervical spine.  Atrophy and chronic microvascular ischemic change. Remote left temporal lobe infarct.  Cervical spondylosis.   Electronically Signed   By: Drusilla Kanner M.D.   On: 07/11/2013 21:44   Ct Cervical Spine Wo Contrast  07/11/2013   CLINICAL DATA:  Status post fall with a blow to the head.  EXAM: CT HEAD WITHOUT CONTRAST  CT CERVICAL SPINE WITHOUT CONTRAST  TECHNIQUE: Multidetector CT imaging of the head and cervical spine was performed following the standard protocol without intravenous contrast. Multiplanar CT image reconstructions of the cervical spine were also generated.  COMPARISON:  Head and cervical spine CT scan 10/25/2012.  FINDINGS: CT HEAD FINDINGS  Remote left temporal lobe infarct is again seen. There is atrophy and chronic microvascular ischemic change. No evidence of acute intracranial abnormality including infarction, hemorrhage, mass lesion, mass effect,  midline shift or abnormal extra-axial fluid collection. No hydrocephalus or pneumocephalus. Hypoplastic mastoid air cells are again seen bilaterally. There is a small amount fluid in the right mastoid air cells, unchanged. The calvarium is intact.  CT CERVICAL SPINE FINDINGS  No fracture or malalignment of the cervical spine is identified. Degenerative disc disease appears worst at C5-6. Lung apices are clear. Paraspinous structures are unremarkable.  IMPRESSION: No acute finding head or cervical spine.  Atrophy and chronic microvascular ischemic change. Remote left temporal lobe infarct.  Cervical spondylosis.   Electronically Signed   By: Drusilla Kanner M.D.   On: 07/11/2013  21:44   Dg Knee Complete 4 Views Left  07/11/2013   CLINICAL DATA:  Fall.  Left knee pain.  EXAM: LEFT KNEE - COMPLETE 4+ VIEW  COMPARISON:  None.  FINDINGS: There is no acute bony or joint abnormality. Mild degenerative change is seen about the knee. No joint effusion.  IMPRESSION: No acute finding.   Electronically Signed   By: Drusilla Kanner M.D.   On: 07/11/2013 22:23   Dg Foot Complete Left  07/11/2013   CLINICAL DATA:  Left foot pain following injury  EXAM: LEFT FOOT - COMPLETE 3+ VIEW  COMPARISON:  05/30/2010  FINDINGS: Hallux valgus deformity is noted. No acute fracture or dislocation is noted. No gross soft tissue abnormality is seen.  IMPRESSION: No acute abnormality noted.   Electronically Signed   By: Alcide Clever M.D.   On: 07/11/2013 18:11    EKG Interpretation   None       MDM   1. Ankle sprain, left, initial encounter    Patient with ankle sprain. She fell after getting up out of a chair. She has been unable to tolerate bearing weight on her left ankle. Hip range of motion and knee range of motion are normal. Patient has been seen by and discussed with Dr. Loretha Stapler, who agrees. Will discharge with an ankle brace, and primary care followup. The patient is stable and ready for discharge.  I personally performed the services described in this documentation, which was scribed in my presence. The recorded information has been reviewed and is accurate.    Roxy Horseman, PA-C 07/11/13 2312

## 2013-07-11 NOTE — Discharge Instructions (Signed)

## 2013-07-11 NOTE — ED Notes (Signed)
Presents with a fall from getting up out of chair, did not lose consciousness, fell forward while trying to get out of wheelchair to get walker and landed on carpet. Pt reports hitting front of head, left hand bruising, left foot and ankle swelling, cms intact. Pt alert, acting normally per family.  Fall occurred yesterday.  Today began having trouble bearing weight on left foot.  Fall was unwitnessed.

## 2013-07-11 NOTE — Progress Notes (Signed)
Orthopedic Tech Progress Note Patient Details:  Madeline KlippelModine G Mendez 10/21/1929 [id number]  Ortho Devices Type of Ortho Device: ASO   [name], [name] 07/11/2013, 11:13 PM

## 2013-07-12 NOTE — ED Provider Notes (Signed)
Medical screening examination/treatment/procedure(s) were conducted as a shared visit with non-physician practitioner(s) and myself.  I personally evaluated the patient during the encounter.   Please see my separate note.     Candyce ChurnJohn David Shelvy Perazzo, MD 07/12/13 [phone]

## 2013-07-12 NOTE — ED Provider Notes (Signed)
Medical screening examination/treatment/procedure(s) were conducted as a shared visit with non-physician practitioner(s) and myself.  I personally evaluated the patient during the encounter.  EKG Interpretation    Date/Time:  Friday July 11 2013 16:58:17 EST Ventricular Rate:  71 PR Interval:  294 QRS Duration: 92 QT Interval:  426 QTC Calculation: 462 R Axis:   34 Text Interpretation:  Atrial-paced rhythm with prolonged AV conduction Incomplete right bundle branch block Nonspecific ST and T wave abnormality Abnormal ECG No significant change was found Confirmed by [facility]  MD, TREY ([id number]) on 07/12/2013 12:41:49 AM            80 yo female with a fall yesterday, now pain in left ankle, primarily.  On exam, well appearing, not distressed, mild forehead contusion, neck nontender, abdomen soft, normal respiratory effort, left ankle tender to palpation with good ROM, intact pulses.  Normal ROM of left knee.  Imaging negative.  Plan dc.  Clinical Impression: 1. Ankle sprain, left, initial encounter       Candyce ChurnJohn David Kaleya Douse, MD 07/12/13 [phone]

## 2013-07-22 ENCOUNTER — Emergency Department: Payer: Self-pay | Admitting: Emergency Medicine

## 2013-08-11 ENCOUNTER — Encounter: Payer: Medicare Other | Admitting: *Deleted

## 2013-08-12 ENCOUNTER — Encounter: Payer: Self-pay | Admitting: Cardiovascular Disease

## 2013-08-13 ENCOUNTER — Encounter: Payer: Self-pay | Admitting: Cardiovascular Disease

## 2013-08-20 ENCOUNTER — Encounter: Payer: Self-pay | Admitting: *Deleted

## 2013-08-26 ENCOUNTER — Emergency Department: Payer: Self-pay | Admitting: Emergency Medicine

## 2013-09-16 ENCOUNTER — Emergency Department: Payer: Self-pay | Admitting: Emergency Medicine

## 2013-09-16 LAB — BASIC METABOLIC PANEL
Anion Gap: 1 — ABNORMAL LOW (ref 7–16)
BUN: 24 mg/dL — ABNORMAL HIGH (ref 7–18)
CO2: 31 mmol/L (ref 21–32)
CREATININE: 1.28 mg/dL (ref 0.60–1.30)
Calcium, Total: 9.2 mg/dL (ref 8.5–10.1)
Chloride: 104 mmol/L (ref 98–107)
EGFR (African American): 45 — ABNORMAL LOW
GFR CALC NON AF AMER: 39 — AB
Glucose: 80 mg/dL (ref 65–99)
OSMOLALITY: 275 (ref 275–301)
Potassium: 3.9 mmol/L (ref 3.5–5.1)
SODIUM: 136 mmol/L (ref 136–145)

## 2013-09-16 LAB — CBC
HCT: 38.3 % (ref 35.0–47.0)
HGB: 12.7 g/dL (ref 12.0–16.0)
MCH: 30.1 pg (ref 26.0–34.0)
MCHC: 33.2 g/dL (ref 32.0–36.0)
MCV: 91 fL (ref 80–100)
PLATELETS: 246 10*3/uL (ref 150–440)
RBC: 4.22 10*6/uL (ref 3.80–5.20)
RDW: 16.2 % — ABNORMAL HIGH (ref 11.5–14.5)
WBC: 9.5 10*3/uL (ref 3.6–11.0)

## 2013-09-17 ENCOUNTER — Emergency Department: Payer: Self-pay | Admitting: Emergency Medicine

## 2013-09-22 ENCOUNTER — Telehealth: Payer: Self-pay | Admitting: Internal Medicine

## 2013-09-22 NOTE — Telephone Encounter (Signed)
LM with nursing facility informing them that the remote was due on 2-9. They advised that a fax with that information be sent. Will send fax.

## 2013-09-22 NOTE — Telephone Encounter (Signed)
New message           Pt nurse would like to know how often pt gets her pacemaker checked?

## 2013-09-25 ENCOUNTER — Encounter: Payer: Self-pay | Admitting: *Deleted

## 2013-09-29 ENCOUNTER — Ambulatory Visit (INDEPENDENT_AMBULATORY_CARE_PROVIDER_SITE_OTHER): Payer: Medicare HMO | Admitting: *Deleted

## 2013-09-29 DIAGNOSIS — Z950 Presence of cardiac pacemaker: Secondary | ICD-10-CM

## 2013-09-29 DIAGNOSIS — I495 Sick sinus syndrome: Secondary | ICD-10-CM

## 2013-09-30 ENCOUNTER — Telehealth: Payer: Self-pay | Admitting: Internal Medicine

## 2013-09-30 LAB — MDC_IDC_ENUM_SESS_TYPE_REMOTE
Brady Statistic AP VP Percent: 0.09 %
Brady Statistic AP VS Percent: 99.85 %
Brady Statistic AS VS Percent: 0.07 %
Brady Statistic RV Percent Paced: 0.09 %
Lead Channel Impedance Value: 408 Ohm
Lead Channel Sensing Intrinsic Amplitude: 1.753
Lead Channel Setting Pacing Amplitude: 2 V
Lead Channel Setting Pacing Pulse Width: 0.6 ms
Lead Channel Setting Sensing Sensitivity: 0.9 mV
MDC IDC MSMT BATTERY VOLTAGE: 2.97 V
MDC IDC MSMT LEADCHNL RV IMPEDANCE VALUE: 544 Ohm
MDC IDC MSMT LEADCHNL RV SENSING INTR AMPL: 8.279
MDC IDC SESS DTM: 20150331143726
MDC IDC SET LEADCHNL RV PACING AMPLITUDE: 2.5 V
MDC IDC STAT BRADY AS VP PERCENT: 0 %
MDC IDC STAT BRADY RA PERCENT PACED: 99.93 %
Zone Setting Detection Interval: 350 ms
Zone Setting Detection Interval: 400 ms

## 2013-09-30 NOTE — Telephone Encounter (Signed)
New message     Pt is at an assisted living facility---please send all pacer remote reports to their office.  Fax [phone]/adress us [address], [place] [phone]

## 2013-09-30 NOTE — Telephone Encounter (Signed)
New address noted in demographics/kwm

## 2013-10-10 ENCOUNTER — Encounter: Payer: Self-pay | Admitting: *Deleted

## 2013-10-20 ENCOUNTER — Encounter: Payer: Self-pay | Admitting: Internal Medicine

## 2013-12-22 ENCOUNTER — Emergency Department: Payer: Self-pay | Admitting: Emergency Medicine

## 2013-12-23 ENCOUNTER — Encounter (HOSPITAL_COMMUNITY): Payer: Self-pay | Admitting: Internal Medicine

## 2013-12-23 ENCOUNTER — Inpatient Hospital Stay (HOSPITAL_COMMUNITY)
Admit: 2013-12-23 | Discharge: 2013-12-24 | DRG: 552 | Disposition: A | Discharge status: Hospice | Payer: Medicare Other | Source: Other Acute Inpatient Hospital | Attending: Internal Medicine | Admitting: Internal Medicine

## 2013-12-23 ENCOUNTER — Inpatient Hospital Stay (HOSPITAL_COMMUNITY): Payer: Medicare Other

## 2013-12-23 DIAGNOSIS — S129XXA Fracture of neck, unspecified, initial encounter: Principal | ICD-10-CM

## 2013-12-23 DIAGNOSIS — I2510 Atherosclerotic heart disease of native coronary artery without angina pectoris: Secondary | ICD-10-CM | POA: Diagnosis present

## 2013-12-23 DIAGNOSIS — Z515 Encounter for palliative care: Secondary | ICD-10-CM | POA: Diagnosis not present

## 2013-12-23 DIAGNOSIS — K922 Gastrointestinal hemorrhage, unspecified: Secondary | ICD-10-CM | POA: Diagnosis present

## 2013-12-23 DIAGNOSIS — F0391 Unspecified dementia with behavioral disturbance: Secondary | ICD-10-CM | POA: Diagnosis not present

## 2013-12-23 DIAGNOSIS — Z951 Presence of aortocoronary bypass graft: Secondary | ICD-10-CM | POA: Diagnosis not present

## 2013-12-23 DIAGNOSIS — I4891 Unspecified atrial fibrillation: Secondary | ICD-10-CM | POA: Diagnosis present

## 2013-12-23 DIAGNOSIS — Y9210 Unspecified residential institution as the place of occurrence of the external cause: Secondary | ICD-10-CM | POA: Diagnosis not present

## 2013-12-23 DIAGNOSIS — Z66 Do not resuscitate: Secondary | ICD-10-CM | POA: Diagnosis not present

## 2013-12-23 DIAGNOSIS — N179 Acute kidney failure, unspecified: Secondary | ICD-10-CM | POA: Diagnosis present

## 2013-12-23 DIAGNOSIS — F03918 Unspecified dementia, unspecified severity, with other behavioral disturbance: Secondary | ICD-10-CM | POA: Diagnosis present

## 2013-12-23 DIAGNOSIS — W19XXXA Unspecified fall, initial encounter: Secondary | ICD-10-CM | POA: Diagnosis not present

## 2013-12-23 DIAGNOSIS — Z9181 History of falling: Secondary | ICD-10-CM | POA: Diagnosis present

## 2013-12-23 HISTORY — DX: Unspecified dementia, unspecified severity, without behavioral disturbance, psychotic disturbance, mood disturbance, and anxiety: F03.90

## 2013-12-23 HISTORY — DX: Gastrointestinal hemorrhage, unspecified: K92.2

## 2013-12-23 HISTORY — DX: Unspecified atrial fibrillation: I48.91

## 2013-12-23 LAB — COMPREHENSIVE METABOLIC PANEL
ALBUMIN: 3.8 g/dL (ref 3.4–5.0)
ALBUMIN: 3.9 g/dL (ref 3.5–5.2)
ALT: 10 U/L (ref 0–35)
AST: 14 U/L — AB (ref 15–37)
AST: 15 U/L (ref 0–37)
Alkaline Phosphatase: 114 U/L
Alkaline Phosphatase: 110 U/L (ref 39–117)
Anion Gap: 8 (ref 7–16)
BILIRUBIN TOTAL: 0.5 mg/dL (ref 0.3–1.2)
BUN: 33 mg/dL — ABNORMAL HIGH (ref 7–18)
BUN: 32 mg/dL — ABNORMAL HIGH (ref 6–23)
Bilirubin,Total: 0.6 mg/dL (ref 0.2–1.0)
CALCIUM: 9.1 mg/dL (ref 8.5–10.1)
CHLORIDE: 100 meq/L (ref 96–112)
CO2: 24 mEq/L (ref 19–32)
CO2: 27 mmol/L (ref 21–32)
Calcium: 9.5 mg/dL (ref 8.4–10.5)
Chloride: 102 mmol/L (ref 98–107)
Creatinine, Ser: 1.34 mg/dL — ABNORMAL HIGH (ref 0.50–1.10)
Creatinine: 1.53 mg/dL — ABNORMAL HIGH (ref 0.60–1.30)
EGFR (Non-African Amer.): 31 — ABNORMAL LOW
GFR CALC AF AMER: 36 — AB
GFR calc Af Amer: 42 mL/min — ABNORMAL LOW (ref >90)
GFR calc non Af Amer: 36 mL/min — ABNORMAL LOW (ref >90)
GLUCOSE: 124 mg/dL — AB (ref 65–99)
Glucose, Bld: 133 mg/dL — ABNORMAL HIGH (ref 70–99)
Osmolality: 282 (ref 275–301)
POTASSIUM: 4.4 meq/L (ref 3.7–5.3)
Potassium: 4.2 mmol/L (ref 3.5–5.1)
SGPT (ALT): 17 U/L (ref 12–78)
SODIUM: 140 meq/L (ref 137–147)
Sodium: 137 mmol/L (ref 136–145)
Total Protein: 7.2 g/dL (ref 6.4–8.2)
Total Protein: 7 g/dL (ref 6.0–8.3)

## 2013-12-23 LAB — CBC
HCT: 33.5 % — ABNORMAL LOW (ref 36.0–46.0)
Hemoglobin: 10.9 g/dL — ABNORMAL LOW (ref 12.0–15.0)
MCH: 30.4 pg (ref 26.0–34.0)
MCHC: 32.5 g/dL (ref 30.0–36.0)
MCV: 93.3 fL (ref 78.0–100.0)
PLATELETS: 198 10*3/uL (ref 150–400)
RBC: 3.59 MIL/uL — ABNORMAL LOW (ref 3.87–5.11)
RDW: 16.6 % — AB (ref 11.5–15.5)
WBC: 12.3 10*3/uL — AB (ref 4.0–10.5)

## 2013-12-23 LAB — URINALYSIS, COMPLETE
BILIRUBIN, UR: NEGATIVE
Bacteria: NONE SEEN
Blood: NEGATIVE
GLUCOSE, UR: NEGATIVE mg/dL (ref 0–75)
Hyaline Cast: 2
Ketone: NEGATIVE
LEUKOCYTE ESTERASE: NEGATIVE
Nitrite: NEGATIVE
PROTEIN: NEGATIVE
Ph: 6 (ref 4.5–8.0)
Specific Gravity: 1.015 (ref 1.003–1.030)
Squamous Epithelial: NONE SEEN

## 2013-12-23 LAB — CBC WITH DIFFERENTIAL/PLATELET
BASOS ABS: 0.1 10*3/uL (ref 0.0–0.1)
BASOS PCT: 0.7 %
EOS ABS: 0.3 10*3/uL (ref 0.0–0.7)
EOS PCT: 3.1 %
HCT: 35.7 % (ref 35.0–47.0)
HGB: 11.4 g/dL — ABNORMAL LOW (ref 12.0–16.0)
Lymphocyte #: 1 10*3/uL (ref 1.0–3.6)
Lymphocyte %: 10.2 %
MCH: 30 pg (ref 26.0–34.0)
MCHC: 31.9 g/dL — ABNORMAL LOW (ref 32.0–36.0)
MCV: 94 fL (ref 80–100)
MONOS PCT: 6.3 %
Monocyte #: 0.6 x10 3/mm (ref 0.2–0.9)
Neutrophil #: 7.7 10*3/uL — ABNORMAL HIGH (ref 1.4–6.5)
Neutrophil %: 79.7 %
Platelet: 174 10*3/uL (ref 150–440)
RBC: 3.81 10*6/uL (ref 3.80–5.20)
RDW: 18 % — AB (ref 11.5–14.5)
WBC: 9.7 10*3/uL (ref 3.6–11.0)

## 2013-12-23 LAB — TROPONIN I
Troponin I: <0.3 ng/mL (ref <0.30)
Troponin I: <0.3 ng/mL (ref <0.30)

## 2013-12-23 LAB — CK TOTAL AND CKMB (NOT AT ARMC)
CK, TOTAL: 77 U/L
CK-MB: 1.5 ng/mL (ref 0.5–3.6)

## 2013-12-23 LAB — PROTIME-INR
INR: 1.12 (ref 0.00–1.49)
INR: 1.2
PROTHROMBIN TIME: 14.6 s (ref 11.5–14.7)
Prothrombin Time: 14.2 seconds (ref 11.6–15.2)

## 2013-12-23 LAB — TYPE AND SCREEN
ABO/RH(D): A POS
Antibody Screen: NEGATIVE

## 2013-12-23 LAB — ABO/RH: ABO/RH(D): A POS

## 2013-12-23 MED ORDER — PANTOPRAZOLE SODIUM 40 MG IV SOLR
40.0000 mg | INTRAVENOUS | Status: DC
  Administered 2013-12-23: 40 mg via INTRAVENOUS
  Filled 2013-12-23 (×2): qty 40

## 2013-12-23 MED ORDER — LORAZEPAM 0.5 MG PO TABS
0.2500 mg | ORAL_TABLET | Freq: Three times a day (TID) | ORAL | Status: DC | PRN
  Administered 2013-12-23 – 2013-12-24 (×3): 0.25 mg via ORAL
  Filled 2013-12-23 (×3): qty 1

## 2013-12-23 MED ORDER — MORPHINE SULFATE 2 MG/ML IJ SOLN
1.0000 mg | INTRAMUSCULAR | Status: DC | PRN
  Administered 2013-12-23 – 2013-12-24 (×2): 1 mg via INTRAVENOUS
  Filled 2013-12-23 (×2): qty 1

## 2013-12-23 MED ORDER — PANTOPRAZOLE SODIUM 40 MG PO TBEC
40.0000 mg | DELAYED_RELEASE_TABLET | Freq: Every day | ORAL | Status: DC
  Administered 2013-12-24: 40 mg via ORAL
  Filled 2013-12-23: qty 1

## 2013-12-23 MED ORDER — SODIUM CHLORIDE 0.9 % IV SOLN: INTRAVENOUS | Status: DC

## 2013-12-23 MED ORDER — ACETAMINOPHEN 650 MG RE SUPP: 650.0000 mg | RECTAL | Status: DC | PRN

## 2013-12-23 MED ORDER — ALLOPURINOL 100 MG PO TABS
100.0000 mg | ORAL_TABLET | Freq: Every day | ORAL | Status: DC
  Administered 2013-12-23 – 2013-12-24 (×2): 100 mg via ORAL
  Filled 2013-12-23 (×3): qty 1

## 2013-12-23 MED ORDER — COLCHICINE 0.6 MG PO TABS
0.6000 mg | ORAL_TABLET | Freq: Every day | ORAL | Status: DC | PRN
  Filled 2013-12-23: qty 1

## 2013-12-23 MED ORDER — AMIODARONE HCL 200 MG PO TABS
200.0000 mg | ORAL_TABLET | ORAL | Status: DC
  Administered 2013-12-24: 200 mg via ORAL
  Filled 2013-12-23: qty 1

## 2013-12-23 MED ORDER — ESCITALOPRAM OXALATE 10 MG PO TABS
10.0000 mg | ORAL_TABLET | Freq: Every day | ORAL | Status: DC
  Administered 2013-12-23 – 2013-12-24 (×2): 10 mg via ORAL
  Filled 2013-12-23 (×2): qty 1

## 2013-12-23 MED ORDER — LEVOTHYROXINE SODIUM 150 MCG PO TABS
150.0000 ug | ORAL_TABLET | Freq: Every day | ORAL | Status: DC
  Administered 2013-12-23 – 2013-12-24 (×2): 150 ug via ORAL
  Filled 2013-12-23 (×3): qty 1

## 2013-12-23 NOTE — Clinical Social Work Psychosocial (Signed)
Clinical Social Work Department BRIEF PSYCHOSOCIAL ASSESSMENT 12/23/2013  Patient:  Farrell OursFRIDDLE,Sabryn M     Account Number:  [id number]     Admit date:  12/23/2013  Clinical Social Worker:  Robin SearingALDWELL,JANET, LCSWA  Date/Time:  12/23/2013 12:43 PM  Referred by:  Physician  Date Referred:  12/23/2013 Referred for  SNF Placement   Other Referral:   Interview type:  Other - See comment Other interview type:   Spoke with patient's daughter and husband of 64 years at bedside    PSYCHOSOCIAL DATA Living Status:  FACILITY Admitted from facility:  Other Level of care:  Assisted Living Primary support name:  daughter- husband Primary support relationship to patient:  FAMILY Degree of support available:   good    CURRENT CONCERNS Current Concerns  Post-Acute Placement   Other Concerns:    SOCIAL WORK ASSESSMENT / PLAN Patient admitted from [facility] [place]- she will now need SNF per MD/PT and family has already spoken with their liason with HH/Hospice-preference would be to get her to a SNF that works with the ALF- which includes [facility], [facility] and one in [place].   Assessment/plan status:  Other - See comment Other assessment/ plan:   FL2 and PASARR for SNF search   Information/referral to community resources:    PATIENT'S/FAMILY'S RESPONSE TO PLAN OF CARE: Family very supportive and concerned about patient- her neck collar is causing some discomfort for her as well as agitation- RN advised. CSW will proceed with SNF search and advise on offers as they are secured.     Reece LevyJanet Caldwell, MSW, Theresia MajorsLCSWA [phone]

## 2013-12-23 NOTE — H&P (Signed)
[facility] History and Physical  Patient: [name]  NGE:[id number]:[id number]  DOB: 08/06/1929  DOS: the patient was seen and examined on 12/23/2013 PCP: No primary provider on file.  Chief Complaint: Fall  HPI: Ginette PitmanHattie M Spero GeraldsFriddle is a 80 y.o. female with Past medical history of dementia, coronary artery disease status post CABG, GI bleed, atrial fibrillation, recurrent fall. Patient is presenting with a mechanical fall. The fall was unwitnessed. Patient is a resident of fall memory unit in [place]. Patient is hospice care patient. History was obtained from the documentation as well as patient's niece. Patient has been having multiple episodes of fall in the last couple of days. Since last one month and that has been this rapid progression in her dementia with behavioral issues. She has been agitated and has required changing her medication. She also had an episode of bronchitis. She was treated with amoxicillin for 7 days. She was on her Lexapro which was increased to 10 mg and she was also added on Haldol. Despite which she continues to have agitation and behavioral issues and has been having episodes of the same. And when she was walking out she had episode of fall this morning as well as later on in the evening. When she was brought to the hospital she was initially nonverbal and was blood pressure in 90s over 40s. Later on she regained her consciousness back to her baseline and at present her mentation appears to be stable as per niece   The patient is coming from SNF. And at her baseline independent for most of her ADL.  Review of Systems: as mentioned in the history of present illness.  A Comprehensive review of the other systems is negative.  Past Medical History  Diagnosis Date  . Dementia   . CAD (coronary artery disease)   . GI bleed   . Atrial fibrillation    No past surgical history on file. Social History:  has no tobacco, alcohol, and drug history on file.  Allergies   Allergen Reactions  . Codeine   . Statins   . Sulfa Antibiotics     No family history on file.  Prior to Admission medications   Medication Sig Start Date End Date Taking? Authorizing Provider  allopurinol (ZYLOPRIM) 100 MG tablet Take 100 mg by mouth daily.   Yes Historical Provider, MD  amiodarone (PACERONE) 200 MG tablet Take 200 mg by mouth every Monday, Wednesday, and Friday.   Yes Historical Provider, MD  clopidogrel (PLAVIX) 75 MG tablet Take 75 mg by mouth daily with breakfast.   Yes Historical Provider, MD  escitalopram (LEXAPRO) 10 MG tablet Take 10 mg by mouth daily.   Yes Historical Provider, MD  furosemide (LASIX) 20 MG tablet Take 20 mg by mouth daily.   Yes Historical Provider, MD  haloperidol (HALDOL) 2 MG/ML solution Take 0.5 mg by mouth at bedtime as needed for agitation.   Yes Historical Provider, MD  levothyroxine (SYNTHROID, LEVOTHROID) 150 MCG tablet Take 150 mcg by mouth daily before breakfast.   Yes Historical Provider, MD  meloxicam (MOBIC) 15 MG tablet Take 15 mg by mouth daily.   Yes Historical Provider, MD  metoprolol succinate (TOPROL-XL) 25 MG 24 hr tablet Take 25 mg by mouth daily.   Yes Historical Provider, MD  pantoprazole (PROTONIX) 40 MG tablet Take 40 mg by mouth daily.   Yes Historical Provider, MD  potassium chloride (K-DUR,KLOR-CON) 10 MEQ tablet Take 10 mEq by mouth 2 (two) times daily.  Yes Historical Provider, MD  ranitidine (ZANTAC) 150 MG capsule Take 150 mg by mouth 2 (two) times daily.   Yes Historical Provider, MD  ranolazine (RANEXA) 500 MG 12 hr tablet Take 500 mg by mouth 2 (two) times daily.   Yes Historical Provider, MD  amoxicillin-clavulanate (AUGMENTIN) 875-125 MG per tablet Take 1 tablet by mouth 2 (two) times daily.    Historical Provider, MD    Physical Exam: Filed Vitals:   12/23/13 0337  BP: 157/67  Pulse: 70  Temp: 97.3 F (36.3 C)  Resp: 16  SpO2: 98%    General: Alert, Awake and difficult to assess orientation.  Appear in mild distress Eyes: PERRL, right eye significantly swollen ENT: Oral Mucosa clear dry. Neck: No JVD Cardiovascular: S1 and S2 Present, no Murmur, Peripheral Pulses Present Respiratory: Bilateral Air entry equal and Decreased, Clear to Auscultation,  No Crackles, no wheezes Abdomen: Bowel Sound Present, Soft and Non tender Skin: No Rash Extremities: Bilateral Pedal edema, no calf tenderness Neurologic: Grossly no focal neuro deficit. Motor strength bilaterally equal, sensations bilaterally equal, Babinski negative, reflexes present.  Labs on Admission:  Labs from the other facility reviewed shows mild history of present illness, normal troponin, normal LFT and CMP, no significant abnormality on CBC. CT head shows large hematoma on the right frontal area CT cervical spine shows C1 and C2 fracture with displacement of C1. EKG: ST depression in V2 and V3 As per the history provided by that admitting ED patient may have ST changes in 23 aVF  Assessment/Plan Principal Problem:   Cervical spine fracture Active Problems:   Acute kidney injury   Fall   Dementia with behavioral disturbance   Hospice care patient   1. Cervical spine fracture Patient presents with a mechanical fall which has lead to cervical spinal fracture. She does not have any neurological deficit on examination. She currently has a cervical collar in place. Neurosurgery has been consulted who recommends that due to presence of no neurological deficit, age, dementia, acute kidney injury, coronary artery disease and hospice care patient with the DO NOT RESUSCITATE status emergent surgery would not be indicated. They will evaluate the patient. We have consulted, surgery as well who will follow the patient. At present pain management and continue with cervical collar and bedrest. Follow serial neuro checks.  2. Fall. Appears to be mechanical. At present we will monitor her on telemetry. Swallows troponin and  repeat lab work. Urine on the other facility was normal. Would continue to monitor neurochecks.  3. Dementia with behavioral disturbances. At present patient is on Lexapro which continued on Haldol IV as needed.  4. Hospice care patient. Patient's CODE STATUS is DO NOT RESUSCITATE as per the niece, recommended to obtain documentation.  Consults: Neurosurgery and trauma surgery  DVT Prophylaxis: mechanical compression device Nutrition: N.p.o.  Code Status: DNR/DNI  Family Communication: niece was present at bedside, opportunity was given to ask question and all questions were answered satisfactorily at the time of interview. Disposition: Admitted to inpatient in telemetry unit.  Author: Lynden OxfordPranav Patel, MD [facility] Pager: [phone] 12/23/2013, 5:21 AM    If 7PM-7AM, please contact night-coverage [email or web address] Password [email or web address]

## 2013-12-23 NOTE — Consult Note (Signed)
  Performed her review of [name]'s CT scan of her cervical spine from [facility] as a way of in formally and unofficially rendering an opinion on management of her neck in a lady with a history of severe dementia who apparently has end-stage dementia with plans of hospice care in the future. Certainly in light of her medical status consistent with DO NOT RESUSCITATE end-stage dementia on hospice we would not entertain any aggressive intervention for a cervical spine fracture. She has hangman's fracture of C1 and a type II odontoid fracture that does appear to have still some posterior cortical bone that may be intact as well as extensive calcifications throughout many of her anterior posterior ligamentous structures. Management for this can range anywhere from doing nothing in a lady who sorry considered terminal for hospice to placement and a very soft or hard cervical collar. Certainly with severe dementia hard cervical collar has some problems with creating more anxiety more confusion so a soft collar may be the best option. However I also feel it is completely reasonable to not treat this at all and not placing a cervical collar in light of the aforementioned previous medical conditions. Certainly there is possibility of displacement and causing spinal cord injury. However this would also be left untreated. Please of note interstitial consultation is needed or if this opinion on a film evaluation is adequate.

## 2013-12-23 NOTE — Clinical Social Work Placement (Addendum)
Clinical Social Work Department CLINICAL SOCIAL WORK PLACEMENT NOTE 12/23/2013  Patient:  [name],[name]  Account Number:  [id number] Admit date:  12/23/2013  Clinical Social Worker:  Robin SearingJANET CALDWELL, LCSWA  Date/time:  12/23/2013 12:53 PM  Clinical Social Work is seeking post-discharge placement for this patient at the following level of care:   SKILLED NURSING   (*CSW will update this form in Epic as items are completed)   12/23/2013  Patient/family provided with [facility] Department of Clinical Social Work's list of facilities offering this level of care within the geographic area requested by the patient (or if unable, by the patient's family).  12/23/2013  Patient/family informed of their freedom to choose among providers that offer the needed level of care, that participate in Medicare, Medicaid or managed care program needed by the patient, have an available bed and are willing to accept the patient.  12/23/2013  Patient/family informed of [facility]' ownership interest in [facility], as well as of the fact that they are under no obligation to receive care at this facility.  PASARR submitted to EDS on 12/23/2013 PASARR number received on   FL2 transmitted to all facilities in geographic area requested by pt/family on  12/23/2013 FL2 transmitted to all facilities within larger geographic area on   Patient informed that his/her managed care company has contracts with or will negotiate with  certain facilities, including the following:     Patient/family informed of bed offers received:   Patient chooses bed at  Physician recommends and patient chooses bed at    Patient to be transferred to  on   Patient to be transferred to facility by  Patient and family notified of transfer on  Name of family member notified:    The following physician request were entered in Epic:   Additional Comments:   Reece LevyJanet Caldwell, MSW, Theresia MajorsLCSWA [phone]

## 2013-12-23 NOTE — Progress Notes (Signed)
Patient admitted after midnight by Dr. Allena KatzPatel.  Plan per family is to place a soft collar.  Patient is from memory care unit with hospice following- will need SNF- palliative care to follow there.  Resume diet.  Need to clarify bed rest vs activity allowed with neurosurgery  Marlin CanaryJessica Vann DO

## 2013-12-23 NOTE — Consult Note (Signed)
Reason for Consult:c spine fracture, fall Referring Physician: Belinda Block Galvan is an 80 y.o. female.  HPI:  Pt is an 80 yo F at nursing home on hospice for dementia who fell last night.  She has had several falls, but this was the worst one.  It was unwitnessed.  She had bruising on head. She has h/o CVA, CABG, GI Bleed, and atrial fibrillation.  She has had recent decline in memory and functioning and has had changes in her medication.    Past Medical History  Diagnosis Date  . Dementia   . CAD (coronary artery disease)   . GI bleed   . Atrial fibrillation     CABG  No family history on file.  Social History:  has no tobacco, alcohol, and drug history on file.  Allergies:  Allergies  Allergen Reactions  . Codeine Itching  . Statins   . Sulfa Antibiotics     Medications: I have reviewed the patient's current medications.  Results for orders placed during the hospital encounter of 12/23/13 (from the past 48 hour(s))  CBC     Status: Abnormal   Collection Time    12/23/13  5:30 AM      Result Value Ref Range   WBC 12.3 (*) 4.0 - 10.5 K/uL   RBC 3.59 (*) 3.87 - 5.11 MIL/uL   Hemoglobin 10.9 (*) 12.0 - 15.0 g/dL   HCT 33.5 (*) 36.0 - 46.0 %   MCV 93.3  78.0 - 100.0 fL   MCH 30.4  26.0 - 34.0 pg   MCHC 32.5  30.0 - 36.0 g/dL   RDW 16.6 (*) 11.5 - 15.5 %   Platelets 198  150 - 400 K/uL  COMPREHENSIVE METABOLIC PANEL     Status: Abnormal   Collection Time    12/23/13  5:30 AM      Result Value Ref Range   Sodium 140  137 - 147 mEq/L   Potassium 4.4  3.7 - 5.3 mEq/L   Chloride 100  96 - 112 mEq/L   CO2 24  19 - 32 mEq/L   Glucose, Bld 133 (*) 70 - 99 mg/dL   BUN 32 (*) 6 - 23 mg/dL   Creatinine, Ser 1.34 (*) 0.50 - 1.10 mg/dL   Calcium 9.5  8.4 - 10.5 mg/dL   Total Protein 7.0  6.0 - 8.3 g/dL   Albumin 3.9  3.5 - 5.2 g/dL   AST 15  0 - 37 U/L   ALT 10  0 - 35 U/L   Alkaline Phosphatase 110  39 - 117 U/L   Total Bilirubin 0.5  0.3 - 1.2 mg/dL   GFR  calc non Af Amer 36 (*) >90 mL/min   GFR calc Af Amer 42 (*) >90 mL/min   Comment: (NOTE)     The eGFR has been calculated using the CKD EPI equation.     This calculation has not been validated in all clinical situations.     eGFR's persistently <90 mL/min signify possible Chronic Kidney     Disease.  PROTIME-INR     Status: None   Collection Time    12/23/13  5:30 AM      Result Value Ref Range   Prothrombin Time 14.2  11.6 - 15.2 seconds   INR 1.12  0.00 - 1.49  TROPONIN I     Status: None   Collection Time    12/23/13  5:30 AM  Result Value Ref Range   Troponin I <0.30  <0.30 ng/mL   Comment:            Due to the release kinetics of cTnI,     a negative result within the first hours     of the onset of symptoms does not rule out     myocardial infarction with certainty.     If myocardial infarction is still suspected,     repeat the test at appropriate intervals.    No results found.  Review of Systems  Unable to perform ROS: dementia   Blood pressure 167/63, pulse 69, temperature 97.8 F (36.6 C), resp. rate 16, SpO2 95.00%. Physical Exam  Constitutional: She appears well-developed and well-nourished. No distress.  HENT:  Head: Normocephalic.    Right Ear: External ear normal.  Left Ear: External ear normal.  Nose: Nose normal.  Hematoma on right scalp Swollen right eye.  Eyes: Conjunctivae are normal. Pupils are equal, round, and reactive to light. Right eye exhibits no discharge. Left eye exhibits no discharge. No scleral icterus.  Neck:  In cervical collar   Cardiovascular: Normal rate, regular rhythm, normal heart sounds and intact distal pulses.  Exam reveals no gallop and no friction rub.   No murmur heard. Respiratory: Breath sounds normal. No respiratory distress. She has no wheezes. She has no rales. She exhibits no tenderness.  GI: Soft. Bowel sounds are normal. She exhibits no distension and no mass. There is no tenderness. There is no rebound  and no guarding.  Musculoskeletal: She exhibits tenderness (right knee and left wrist). She exhibits no edema.  Neurological: She is alert. Coordination normal.  Did not speak, but followed simple commands to move toes, take deep breath, etc.  Skin: Abrasion noted. She is not diaphoretic.     Scattered bruising left arm. Abrasion right knee  Psychiatric:  Dementia, calm    Assessment/Plan: Fall, cervical spine fracture, bruising/abrasions on extremities  Cervical collar, neurosurgery consult.   Check left wrist and right knee film. Care per [facility]. Neuro checks.       BYERLY,FAERA 12/23/2013, 6:52 AM

## 2013-12-23 NOTE — Progress Notes (Signed)
Patient ID: Madeline PitmanHattie M Mendez, female   DOB: 12/10/1929, 80 y.o.   MRN: [id number] C1C2 FXs per NS. Other x-rays today neg. Trauma will sign off. Agree with return to facility. Violeta GelinasBurke Thompson, MD, MPH, FACS Trauma: [phone] General Surgery: [phone]

## 2013-12-24 MED ORDER — HYDROCODONE-ACETAMINOPHEN 5-325 MG PO TABS: 1.0000 | ORAL_TABLET | Freq: Three times a day (TID) | ORAL | Status: AC

## 2013-12-24 MED ORDER — ACETAMINOPHEN 325 MG PO TABS
650.0000 mg | ORAL_TABLET | Freq: Four times a day (QID) | ORAL | Status: DC | PRN
  Administered 2013-12-24: 650 mg via ORAL
  Filled 2013-12-24: qty 2

## 2013-12-24 MED ORDER — LORAZEPAM 0.5 MG PO TABS: 0.2500 mg | ORAL_TABLET | Freq: Three times a day (TID) | ORAL | Status: AC | PRN

## 2013-12-24 NOTE — Progress Notes (Signed)
Nutrition Brief Note  Chart reviewed. Pt from memory care unit, followed by Hospice. Pt admitted after a fall. Plan for pt to d/c to SNF with Hospice. Pt's previous diet resumed.    No further nutrition interventions warranted at this time.  Please re-consult as needed.   Kendell BaneHeather Pitts RD, LDN, CNSC [phone] Pager [phone] After Hours Pager

## 2013-12-24 NOTE — Clinical Social Work Note (Signed)
[facility]- family has opted for private pay with Hospice care. Family agreeable to this plan- plan transfer via EMS. Reece LevyJanet Caldwell, MSW, Theresia MajorsLCSWA [phone]

## 2013-12-24 NOTE — Discharge Summary (Signed)
Physician Discharge Summary  Madeline PitmanHattie M Spero Mendez UEA:[id number]:[id number] DOB: 10/26/1929 DOA: 12/23/2013  PCP: No primary provider on file.  Admit date: 12/23/2013 Discharge date: 12/24/2013  Time spent: >35 minutes  Recommendations for Outpatient Follow-up:  Hospice care  Discharge Diagnoses:  Principal Problem:   Cervical spine fracture Active Problems:   Acute kidney injury   Fall   Dementia with behavioral disturbance   Hospice care patient   Discharge Condition: poor   Diet recommendation: comfort   There were no vitals filed for this visit.  History of present illness:  80 y.o. female with Past medical history of end stage dementia, coronary artery disease status post CABG, GI bleed, atrial fibrillation, recurrent fall presented with a mechanical fall and found to have C spine fracture; per family Pt is not ambulatory at baseline, progressively declining in health; She is under hospice care for her end stage dementia   Hospital Course:  1. Fall, frequent fall with dementia; Cervical spine fracture -no intervention per neurosurgery; Pt is already unde hospice care, progressively declining, with poor quality of life  -cont pain, comfort care, C collar   2. Fall, frequent, with advanced dementia  3. Advanced dementia,l cont hospice care   Patient's CODE STATUS is DO NOT RESUSCITATE as per the niece, d/w patient, confirmed with her sister at the bedside  -d/w patient, her sister at length at the bedside, they have accepted to cont hospice care      Procedures:  noen (i.e. Studies not automatically included, echos, thoracentesis, etc; not x-rays)  Consultations:  Confused   Discharge Exam: Filed Vitals:   12/24/13 0536  BP: 172/68  Pulse: 70  Temp: 98.3 F (36.8 C)  Resp: 16    General: confused Cardiovascular: s1,s2 rrr Respiratory: CTA BL  Discharge Instructions  Discharge Instructions   Diet - low sodium heart healthy    Complete by:  As directed       Discharge instructions    Complete by:  As directed   Please follow up with hospice care     Increase activity slowly    Complete by:  As directed             Medication List    STOP taking these medications       meloxicam 15 MG tablet  Commonly known as:  MOBIC     nitroGLYCERIN 0.4 MG SL tablet  Commonly known as:  NITROSTAT      TAKE these medications       allopurinol 100 MG tablet  Commonly known as:  ZYLOPRIM  Take 100 mg by mouth 2 (two) times daily.     amiodarone 200 MG tablet  Commonly known as:  PACERONE  Take 200 mg by mouth every Monday, Wednesday, and Friday.     aspirin EC 81 MG tablet  Take 81 mg by mouth every evening.     clopidogrel 75 MG tablet  Commonly known as:  PLAVIX  Take 75 mg by mouth daily with breakfast.     colchicine 0.6 MG tablet  Take 0.6 mg by mouth daily as needed (gout pain).     escitalopram 10 MG tablet  Commonly known as:  LEXAPRO  Take 10 mg by mouth daily.     food thickener Powd  Commonly known as:  THICK IT  Take 1 Container by mouth as needed (quarter size teaspoon bites).     furosemide 20 MG tablet  Commonly known as:  LASIX  Take 20 mg  by mouth daily.     haloperidol 2 MG/ML solution  Commonly known as:  HALDOL  Take 1 mg by mouth at bedtime.     HYDROcodone-acetaminophen 5-325 MG per tablet  Commonly known as:  NORCO/VICODIN  Take 1 tablet by mouth every 6 (six) hours as needed for moderate pain.     HYDROcodone-acetaminophen 5-325 MG per tablet  Commonly known as:  NORCO/VICODIN  Take 1 tablet by mouth 3 (three) times daily.     levothyroxine 150 MCG tablet  Commonly known as:  SYNTHROID, LEVOTHROID  Take 150 mcg by mouth daily before breakfast.     LORazepam 0.5 MG tablet  Commonly known as:  ATIVAN  Take 0.5 tablets (0.25 mg total) by mouth every 8 (eight) hours as needed for anxiety.     metoprolol succinate 25 MG 24 hr tablet  Commonly known as:  TOPROL-XL  Take 25 mg by mouth daily.      pantoprazole 40 MG tablet  Commonly known as:  PROTONIX  Take 40 mg by mouth daily.     potassium chloride 10 MEQ tablet  Commonly known as:  K-DUR,KLOR-CON  Take 10 mEq by mouth daily.     ranitidine 150 MG tablet  Commonly known as:  ZANTAC  Take 150 mg by mouth daily.     ranolazine 500 MG 12 hr tablet  Commonly known as:  RANEXA  Take 500 mg by mouth 2 (two) times daily.       Allergies  Allergen Reactions  . Codeine Itching  . Statins   . Sulfa Antibiotics       The results of significant diagnostics from this hospitalization (including imaging, microbiology, ancillary and laboratory) are listed below for reference.    Significant Diagnostic Studies: Dg Wrist Complete Left  January 01, 2014   CLINICAL DATA:  Fall.  EXAM: LEFT WRIST - COMPLETE 3+ VIEW  COMPARISON:  None.  FINDINGS: Bolt nonunited distal ulnar fractures present. Old healed fracture of the left third metacarpal. No acute fracture identified. Diffuse degenerative change present  IMPRESSION: 1. Old nonunited fracture of the distal ulna. Old healed fracture of the left third metacarpal. 2. Diffuse DJD. No acute abnormality. No evidence of acute fracture or dislocation.   Electronically Signed   By: Maisie Fus  Register   On: 01-01-14 10:18   Dg Knee Complete 4 Views Right  01-01-14   CLINICAL DATA:  Right knee pain secondary to a fall with limited range of motion.  EXAM: RIGHT KNEE - COMPLETE 4+ VIEW  COMPARISON:  09/17/2013  FINDINGS: There is no fracture or dislocation. The components of the right total knee prosthesis appear in good position. No joint effusion. There are some dystrophic calcifications in the soft tissues around the knee, unchanged.  IMPRESSION: No acute abnormalities.   Electronically Signed   By: Geanie Cooley M.D.   On: 01/01/2014 10:03    Microbiology: No results found for this or any previous visit (from the past 240 hour(s)).   Labs: Basic Metabolic Panel:  Recent Labs Lab Jan 01, 2014 0530   NA 140  K 4.4  CL 100  CO2 24  GLUCOSE 133*  BUN 32*  CREATININE 1.34*  CALCIUM 9.5   Liver Function Tests:  Recent Labs Lab 01-Jan-2014 0530  AST 15  ALT 10  ALKPHOS 110  BILITOT 0.5  PROT 7.0  ALBUMIN 3.9   No results found for this basename: LIPASE, AMYLASE,  in the last 168 hours No results found for this basename: AMMONIA,  in the last 168 hours CBC:  Recent Labs Lab 12/23/13 0530  WBC 12.3*  HGB 10.9*  HCT 33.5*  MCV 93.3  PLT 198   Cardiac Enzymes:  Recent Labs Lab 12/23/13 0530 12/23/13 1110 12/23/13 1700  TROPONINI <0.30 <0.30 <0.30   BNP: BNP (last 3 results) No results found for this basename: PROBNP,  in the last 8760 hours CBG: No results found for this basename: GLUCAP,  in the last 168 hours     Signed:  Esperanza SheetsBURIEV, Deyanna Mctier N  [facility] 12/24/2013, 10:59 AM

## 2013-12-24 NOTE — Clinical Social Work Placement (Signed)
Clinical Social Work Department CLINICAL SOCIAL WORK PLACEMENT NOTE 12/24/2013  Patient:  [name],[name]  Account Number:  [id number] Admit date:  12/23/2013  Clinical Social Worker:  Robin SearingJANET Numa Schroeter, LCSWA  Date/time:  12/23/2013 12:53 PM  Clinical Social Work is seeking post-discharge placement for this patient at the following level of care:   SKILLED NURSING   (*CSW will update this form in Epic as items are completed)   12/23/2013  [facility]family provided with [facility] Work's list of facilities offering this level of care within the geographic area requested by the patient (or if unable, by the patient's family).  12/23/2013  Patient/family informed of their freedom to choose among providers that offer the needed level of care, that participate in Medicare, Medicaid or managed care program needed by the patient, have an available bed and are willing to accept the patient.  12/23/2013  Patient/family informed of [facility]' ownership interest in [facility], as well as of the fact that they are under no obligation to receive care at this facility.  PASARR submitted to [employer] on 12/23/2013 PASARR number received on 12/23/2013  FL2 transmitted to all facilities in geographic area requested by pt/family on  12/23/2013 FL2 transmitted to all facilities within larger geographic area on   Patient informed that his/her managed care company has contracts with or will negotiate with  certain facilities, including the following:     Patient/family informed of bed offers received:  12/23/2013 [facility] Physician recommends and patient chooses bed at    [facility]  12/24/2013 Patient to be transferred to facility by ems Patient and family notified of transfer on 12/24/2013 Name of family member notified:  Misty StanleyLisa (neice)  The following physician request  were entered in Epic:   Additional Comments: Reece LevyJanet Kloe Oates, MSW, Theresia MajorsLCSWA [phone]

## 2013-12-24 NOTE — Progress Notes (Signed)
Attempted to call [facility] x2 to give report. Both times receiving voicemail.  Left message the 2nd call made.

## 2013-12-24 NOTE — Progress Notes (Signed)
Early last night patient pulled out her Foley catheter, scant bleeding noted.  Pt was also constantly pulling off her aspen collar. After multiple times of re-apply, pt now has bilateral mittens. Since then the collar has remained intact. Will continue monitor pt.

## 2013-12-24 NOTE — Clinical Social Work Note (Signed)
[facility]  Plan is for private pay so she can remain under Hospice care per niece, Misty StanleyLisa and hospice social worker.  Will advise MD and await dc orders.   Reece LevyJanet Caldwell, MSW, Theresia MajorsLCSWA [phone]

## 2013-12-29 ENCOUNTER — Encounter (HOSPITAL_COMMUNITY): Payer: Self-pay | Admitting: Emergency Medicine

## 2014-01-31 DEATH — deceased

## 2014-06-02 ENCOUNTER — Encounter: Payer: Self-pay | Admitting: *Deleted

## 2014-10-22 ENCOUNTER — Encounter: Payer: Self-pay | Admitting: Gastroenterology

## 2015-07-04 IMAGING — CT CT CERVICAL SPINE WITHOUT CONTRAST
2 series · 10 of 14 positions shown, 12 images · non-contrast
Comparison: 07/22/13

CLINICAL DATA: Unwitnessed fall.  Hematoma to the back of the head.

EXAM:
CT HEAD WITHOUT CONTRAST
CT CERVICAL SPINE WITHOUT CONTRAST
TECHNIQUE: Multidetector CT imaging of the head and cervical spine was
performed following the standard protocol without intravenous
contrast. Multiplanar CT image reconstructions of the cervical spine
were also generated.

[Series 2: head wo · axial · 0.39mm/px · z∈[-178,-97]mm · 3 of 36 slices shown]
[im 9/36  bone]
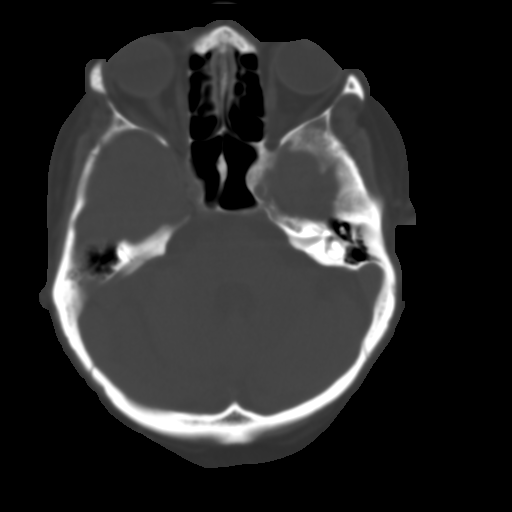
[im 18/36  bone]
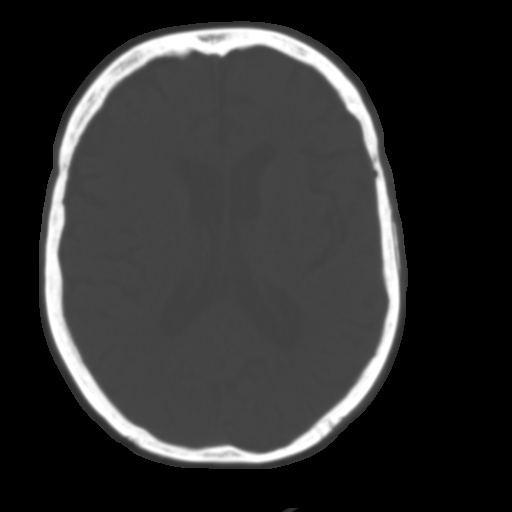
[im 27/36  bone]
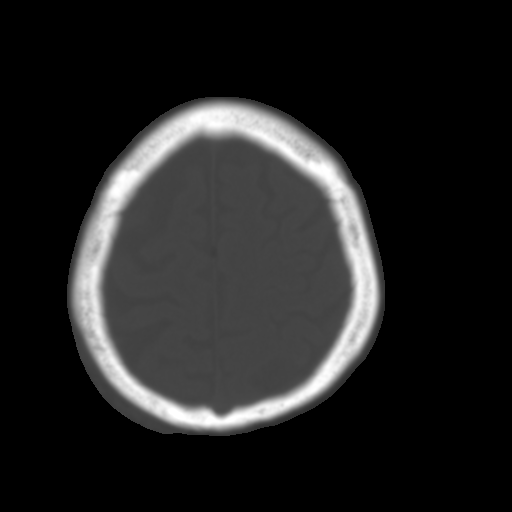

[Series 4: c spine soft · axial · 0.34mm/px · z∈[-310,-214]mm · 7 of 64 slices shown, 9 images]
[im 8/64  soft-tissue]
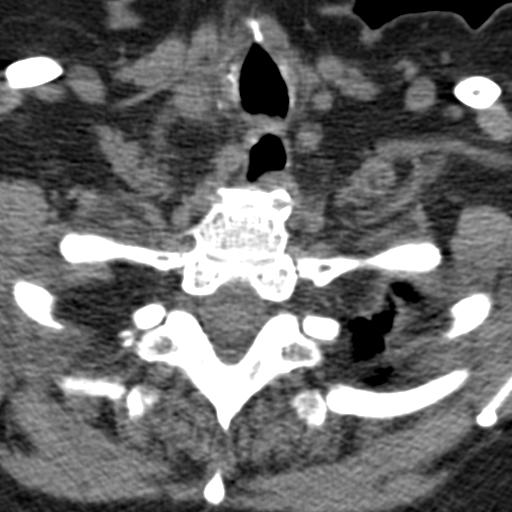
[im 8/64  bone]
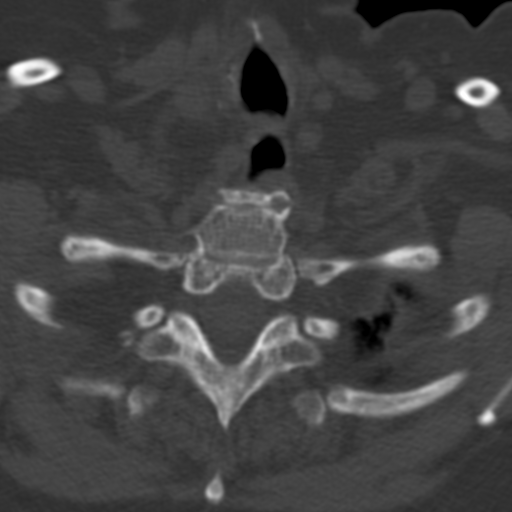
[im 16/64  bone]
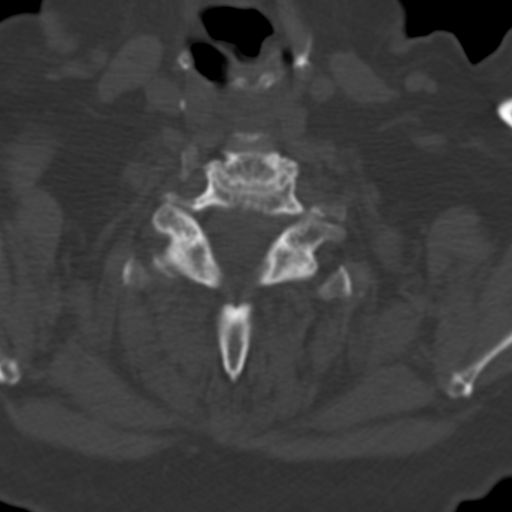
[im 24/64  bone]
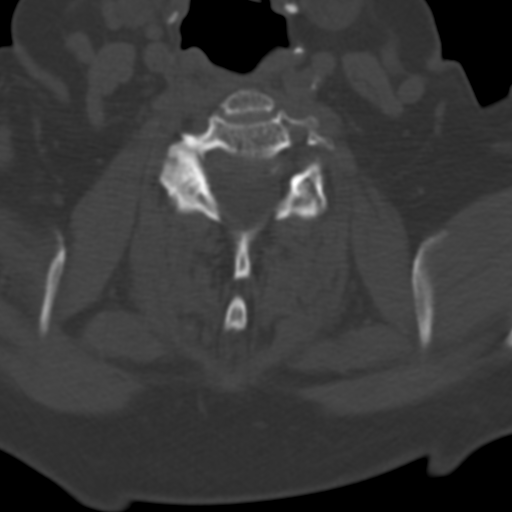
[im 32/64  bone]
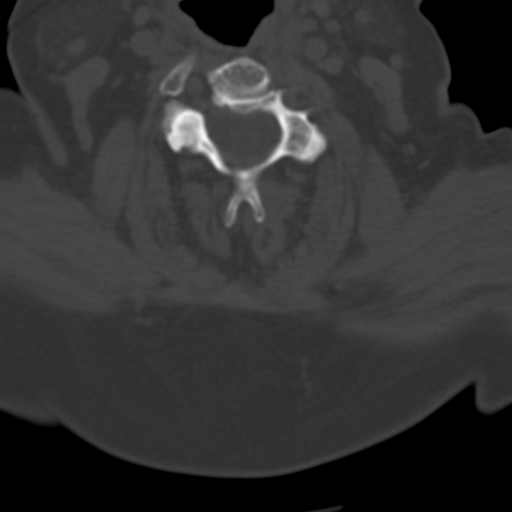
[im 40/64  soft-tissue]
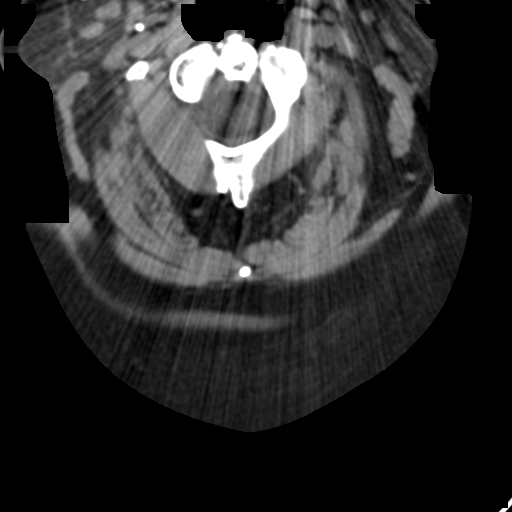
[im 40/64  bone]
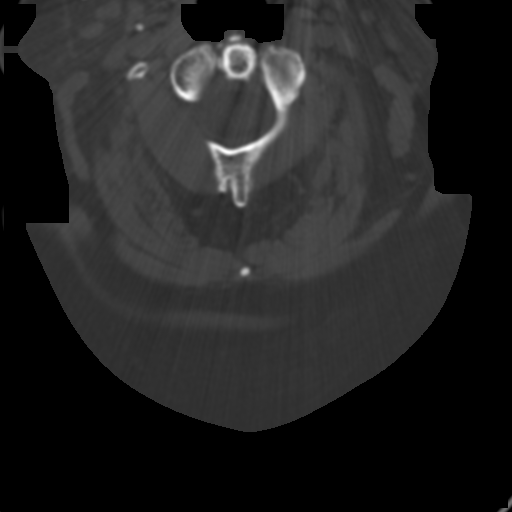
[im 48/64  bone]
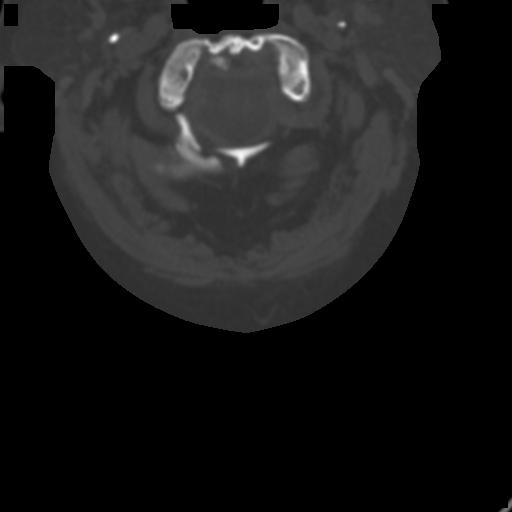
[im 56/64  bone]
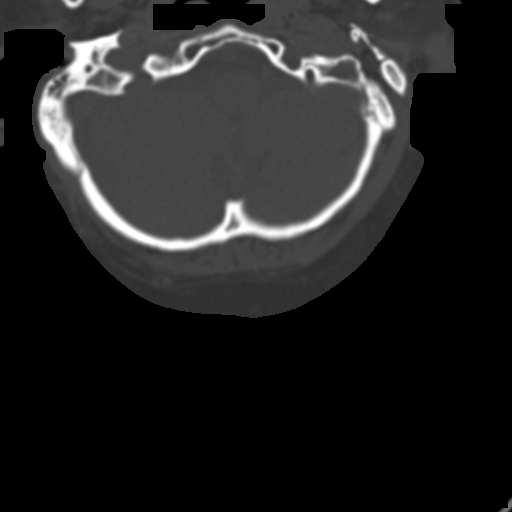

[10 of 14 positions shown; findings below may reference images not displayed]

FINDINGS: CT HEAD FINDINGS

Right prior region scalp hematoma. There is no underlying skull
fracture. Ventricles normal in configuration. There is ventricular
and sulcal enlargement reflecting age related volume loss. No
parenchymal masses or mass effect. Old left temporal infarct. No
evidence of a recent cortical infarct. There is mild periventricular
white matter hypoattenuation consistent with chronic microvascular
ischemic change. Small left base a ganglia calcification is stable.
No extra-axial masses or abnormal fluid collections. No intracranial
hemorrhage. Visualized sinuses and mastoid air cells are clear.

CT CERVICAL SPINE FINDINGS

No fracture. No spondylolisthesis. There are degenerative changes
most evident at C5-C6 and C6-C7 where there is moderate-to-marked
loss of disc height as well as endplate osteophytes. The central
spinal canal and neural foramina are relatively well preserved.
Bones are demineralized. Soft tissues are unremarkable. Lung apices
are clear.
IMPRESSION: HEAD CT: No acute intracranial abnormalities. No intracranial
hemorrhage. No skull fracture.

CERVICAL CT:  No fracture or acute finding.
